# Patient Record
Sex: Female | Born: 1945 | Race: White | Hispanic: No | Smoking: Former smoker
Health system: Southern US, Community
[De-identification: ages and names within clinical notes are randomized; demographics above are authoritative.]

## PROBLEM LIST (undated history)

## (undated) DIAGNOSIS — E7800 Pure hypercholesterolemia, unspecified: Secondary | ICD-10-CM

## (undated) DIAGNOSIS — M1990 Unspecified osteoarthritis, unspecified site: Secondary | ICD-10-CM

## (undated) DIAGNOSIS — F419 Anxiety disorder, unspecified: Secondary | ICD-10-CM

## (undated) DIAGNOSIS — E039 Hypothyroidism, unspecified: Secondary | ICD-10-CM

## (undated) DIAGNOSIS — E119 Type 2 diabetes mellitus without complications: Secondary | ICD-10-CM

## (undated) DIAGNOSIS — F209 Schizophrenia, unspecified: Secondary | ICD-10-CM

## (undated) DIAGNOSIS — I10 Essential (primary) hypertension: Secondary | ICD-10-CM

## (undated) DIAGNOSIS — M069 Rheumatoid arthritis, unspecified: Secondary | ICD-10-CM

## (undated) DIAGNOSIS — J449 Chronic obstructive pulmonary disease, unspecified: Secondary | ICD-10-CM

## (undated) HISTORY — DX: Schizophrenia, unspecified: F20.9

## (undated) HISTORY — PX: ABDOMINAL HYSTERECTOMY: SHX81

## (undated) HISTORY — DX: Type 2 diabetes mellitus without complications: E11.9

## (undated) HISTORY — DX: Unspecified osteoarthritis, unspecified site: M19.90

## (undated) HISTORY — DX: Pure hypercholesterolemia, unspecified: E78.00

## (undated) HISTORY — DX: Essential (primary) hypertension: I10

## (undated) HISTORY — DX: Rheumatoid arthritis, unspecified: M06.9

## (undated) HISTORY — DX: Chronic obstructive pulmonary disease, unspecified: J44.9

## (undated) HISTORY — DX: Anxiety disorder, unspecified: F41.9

## (undated) HISTORY — DX: Hypothyroidism, unspecified: E03.9

---

## 2009-06-28 ENCOUNTER — Emergency Department (HOSPITAL_COMMUNITY): Admission: EM | Admit: 2009-06-28 | Discharge: 2009-06-28 | Payer: Self-pay | Admitting: Family Medicine

## 2010-06-07 ENCOUNTER — Inpatient Hospital Stay (HOSPITAL_COMMUNITY)
Admission: RE | Admit: 2010-06-07 | Discharge: 2010-06-07 | Disposition: A | Discharge status: Home / Self Care | Payer: Medicare PPO | Source: Ambulatory Visit | Attending: Family Medicine | Admitting: Family Medicine

## 2010-06-07 ENCOUNTER — Inpatient Hospital Stay (INDEPENDENT_AMBULATORY_CARE_PROVIDER_SITE_OTHER)
Admission: RE | Admit: 2010-06-07 | Discharge: 2010-06-07 | Disposition: A | Discharge status: Home / Self Care | Payer: Medicare PPO | Source: Ambulatory Visit | Attending: Family Medicine | Admitting: Family Medicine

## 2010-06-07 DIAGNOSIS — J449 Chronic obstructive pulmonary disease, unspecified: Secondary | ICD-10-CM

## 2010-06-20 ENCOUNTER — Inpatient Hospital Stay (INDEPENDENT_AMBULATORY_CARE_PROVIDER_SITE_OTHER)
Admission: RE | Admit: 2010-06-20 | Discharge: 2010-06-20 | Disposition: A | Discharge status: Home / Self Care | Payer: Medicare PPO | Source: Ambulatory Visit | Attending: Family Medicine | Admitting: Family Medicine

## 2010-06-20 ENCOUNTER — Ambulatory Visit (INDEPENDENT_AMBULATORY_CARE_PROVIDER_SITE_OTHER): Payer: Medicare PPO

## 2010-06-20 DIAGNOSIS — J40 Bronchitis, not specified as acute or chronic: Secondary | ICD-10-CM

## 2010-06-20 DIAGNOSIS — J069 Acute upper respiratory infection, unspecified: Secondary | ICD-10-CM

## 2010-06-20 DIAGNOSIS — J989 Respiratory disorder, unspecified: Secondary | ICD-10-CM

## 2014-06-13 DIAGNOSIS — H6123 Impacted cerumen, bilateral: Secondary | ICD-10-CM | POA: Diagnosis not present

## 2014-06-13 DIAGNOSIS — J189 Pneumonia, unspecified organism: Secondary | ICD-10-CM | POA: Diagnosis not present

## 2014-06-19 DIAGNOSIS — I129 Hypertensive chronic kidney disease with stage 1 through stage 4 chronic kidney disease, or unspecified chronic kidney disease: Secondary | ICD-10-CM | POA: Diagnosis not present

## 2014-06-19 DIAGNOSIS — J189 Pneumonia, unspecified organism: Secondary | ICD-10-CM | POA: Diagnosis not present

## 2014-06-19 DIAGNOSIS — E785 Hyperlipidemia, unspecified: Secondary | ICD-10-CM | POA: Diagnosis not present

## 2014-06-19 DIAGNOSIS — E1140 Type 2 diabetes mellitus with diabetic neuropathy, unspecified: Secondary | ICD-10-CM | POA: Diagnosis not present

## 2014-06-19 DIAGNOSIS — Z9119 Patient's noncompliance with other medical treatment and regimen: Secondary | ICD-10-CM | POA: Diagnosis not present

## 2014-06-19 DIAGNOSIS — J449 Chronic obstructive pulmonary disease, unspecified: Secondary | ICD-10-CM | POA: Diagnosis not present

## 2014-07-04 DIAGNOSIS — E785 Hyperlipidemia, unspecified: Secondary | ICD-10-CM | POA: Diagnosis not present

## 2014-07-04 DIAGNOSIS — I10 Essential (primary) hypertension: Secondary | ICD-10-CM | POA: Diagnosis not present

## 2014-07-04 DIAGNOSIS — E1140 Type 2 diabetes mellitus with diabetic neuropathy, unspecified: Secondary | ICD-10-CM | POA: Diagnosis not present

## 2014-07-04 DIAGNOSIS — E1169 Type 2 diabetes mellitus with other specified complication: Secondary | ICD-10-CM | POA: Diagnosis not present

## 2014-07-23 DIAGNOSIS — Z1231 Encounter for screening mammogram for malignant neoplasm of breast: Secondary | ICD-10-CM | POA: Diagnosis not present

## 2014-07-25 DIAGNOSIS — R350 Frequency of micturition: Secondary | ICD-10-CM | POA: Diagnosis not present

## 2014-07-25 DIAGNOSIS — Z1210 Encounter for screening for malignant neoplasm of intestinal tract, unspecified: Secondary | ICD-10-CM | POA: Diagnosis not present

## 2014-07-25 DIAGNOSIS — Z1231 Encounter for screening mammogram for malignant neoplasm of breast: Secondary | ICD-10-CM | POA: Diagnosis not present

## 2014-07-25 DIAGNOSIS — B373 Candidiasis of vulva and vagina: Secondary | ICD-10-CM | POA: Diagnosis not present

## 2014-07-25 DIAGNOSIS — I499 Cardiac arrhythmia, unspecified: Secondary | ICD-10-CM | POA: Diagnosis not present

## 2014-09-02 DIAGNOSIS — Z1231 Encounter for screening mammogram for malignant neoplasm of breast: Secondary | ICD-10-CM | POA: Diagnosis not present

## 2014-09-06 DIAGNOSIS — D0511 Intraductal carcinoma in situ of right breast: Secondary | ICD-10-CM | POA: Diagnosis not present

## 2014-10-10 DIAGNOSIS — F209 Schizophrenia, unspecified: Secondary | ICD-10-CM | POA: Diagnosis not present

## 2014-10-15 DIAGNOSIS — M25511 Pain in right shoulder: Secondary | ICD-10-CM | POA: Diagnosis not present

## 2014-11-06 DIAGNOSIS — I10 Essential (primary) hypertension: Secondary | ICD-10-CM | POA: Diagnosis not present

## 2014-11-12 DIAGNOSIS — J441 Chronic obstructive pulmonary disease with (acute) exacerbation: Secondary | ICD-10-CM | POA: Diagnosis not present

## 2014-11-12 DIAGNOSIS — J9622 Acute and chronic respiratory failure with hypercapnia: Secondary | ICD-10-CM | POA: Diagnosis not present

## 2014-11-21 DIAGNOSIS — E785 Hyperlipidemia, unspecified: Secondary | ICD-10-CM | POA: Diagnosis not present

## 2014-11-21 DIAGNOSIS — E1140 Type 2 diabetes mellitus with diabetic neuropathy, unspecified: Secondary | ICD-10-CM | POA: Diagnosis not present

## 2014-11-21 DIAGNOSIS — E1159 Type 2 diabetes mellitus with other circulatory complications: Secondary | ICD-10-CM | POA: Diagnosis not present

## 2014-11-21 DIAGNOSIS — E039 Hypothyroidism, unspecified: Secondary | ICD-10-CM | POA: Diagnosis not present

## 2014-11-21 DIAGNOSIS — I10 Essential (primary) hypertension: Secondary | ICD-10-CM | POA: Diagnosis not present

## 2014-12-28 DIAGNOSIS — G4733 Obstructive sleep apnea (adult) (pediatric): Secondary | ICD-10-CM | POA: Diagnosis not present

## 2015-01-30 DIAGNOSIS — M1990 Unspecified osteoarthritis, unspecified site: Secondary | ICD-10-CM | POA: Diagnosis not present

## 2015-01-30 DIAGNOSIS — Z6830 Body mass index (BMI) 30.0-30.9, adult: Secondary | ICD-10-CM | POA: Diagnosis not present

## 2015-02-07 DIAGNOSIS — M1711 Unilateral primary osteoarthritis, right knee: Secondary | ICD-10-CM | POA: Diagnosis not present

## 2015-02-07 DIAGNOSIS — M1712 Unilateral primary osteoarthritis, left knee: Secondary | ICD-10-CM | POA: Diagnosis not present

## 2015-02-11 DIAGNOSIS — E1159 Type 2 diabetes mellitus with other circulatory complications: Secondary | ICD-10-CM | POA: Diagnosis not present

## 2015-02-11 DIAGNOSIS — E785 Hyperlipidemia, unspecified: Secondary | ICD-10-CM | POA: Diagnosis not present

## 2015-02-11 DIAGNOSIS — E1140 Type 2 diabetes mellitus with diabetic neuropathy, unspecified: Secondary | ICD-10-CM | POA: Diagnosis not present

## 2015-02-17 DIAGNOSIS — M1711 Unilateral primary osteoarthritis, right knee: Secondary | ICD-10-CM | POA: Diagnosis not present

## 2015-02-19 DIAGNOSIS — E1159 Type 2 diabetes mellitus with other circulatory complications: Secondary | ICD-10-CM | POA: Diagnosis not present

## 2015-02-19 DIAGNOSIS — Z0000 Encounter for general adult medical examination without abnormal findings: Secondary | ICD-10-CM | POA: Diagnosis not present

## 2015-02-19 DIAGNOSIS — Z139 Encounter for screening, unspecified: Secondary | ICD-10-CM | POA: Diagnosis not present

## 2015-02-19 DIAGNOSIS — Z23 Encounter for immunization: Secondary | ICD-10-CM | POA: Diagnosis not present

## 2015-02-19 DIAGNOSIS — E1140 Type 2 diabetes mellitus with diabetic neuropathy, unspecified: Secondary | ICD-10-CM | POA: Diagnosis not present

## 2015-02-19 DIAGNOSIS — E785 Hyperlipidemia, unspecified: Secondary | ICD-10-CM | POA: Diagnosis not present

## 2015-02-19 DIAGNOSIS — I10 Essential (primary) hypertension: Secondary | ICD-10-CM | POA: Diagnosis not present

## 2015-02-19 DIAGNOSIS — Z1389 Encounter for screening for other disorder: Secondary | ICD-10-CM | POA: Diagnosis not present

## 2015-02-23 DIAGNOSIS — M25571 Pain in right ankle and joints of right foot: Secondary | ICD-10-CM | POA: Diagnosis not present

## 2015-02-23 DIAGNOSIS — S82431A Displaced oblique fracture of shaft of right fibula, initial encounter for closed fracture: Secondary | ICD-10-CM | POA: Diagnosis not present

## 2015-02-26 DIAGNOSIS — S82401S Unspecified fracture of shaft of right fibula, sequela: Secondary | ICD-10-CM | POA: Diagnosis not present

## 2015-02-26 DIAGNOSIS — M1990 Unspecified osteoarthritis, unspecified site: Secondary | ICD-10-CM | POA: Diagnosis not present

## 2015-02-26 DIAGNOSIS — Z6829 Body mass index (BMI) 29.0-29.9, adult: Secondary | ICD-10-CM | POA: Diagnosis not present

## 2015-03-03 DIAGNOSIS — S82831A Other fracture of upper and lower end of right fibula, initial encounter for closed fracture: Secondary | ICD-10-CM | POA: Diagnosis not present

## 2015-03-25 DIAGNOSIS — S82831A Other fracture of upper and lower end of right fibula, initial encounter for closed fracture: Secondary | ICD-10-CM | POA: Diagnosis not present

## 2015-04-17 DIAGNOSIS — S82831A Other fracture of upper and lower end of right fibula, initial encounter for closed fracture: Secondary | ICD-10-CM | POA: Diagnosis not present

## 2015-04-25 DIAGNOSIS — I10 Essential (primary) hypertension: Secondary | ICD-10-CM | POA: Diagnosis not present

## 2015-04-25 DIAGNOSIS — M79604 Pain in right leg: Secondary | ICD-10-CM | POA: Diagnosis not present

## 2015-04-25 DIAGNOSIS — B379 Candidiasis, unspecified: Secondary | ICD-10-CM | POA: Diagnosis not present

## 2015-04-25 DIAGNOSIS — E7800 Pure hypercholesterolemia, unspecified: Secondary | ICD-10-CM | POA: Diagnosis not present

## 2015-04-25 DIAGNOSIS — J449 Chronic obstructive pulmonary disease, unspecified: Secondary | ICD-10-CM | POA: Diagnosis not present

## 2015-04-25 DIAGNOSIS — S82831A Other fracture of upper and lower end of right fibula, initial encounter for closed fracture: Secondary | ICD-10-CM | POA: Diagnosis not present

## 2015-04-25 DIAGNOSIS — E119 Type 2 diabetes mellitus without complications: Secondary | ICD-10-CM | POA: Diagnosis not present

## 2015-04-25 DIAGNOSIS — B372 Candidiasis of skin and nail: Secondary | ICD-10-CM | POA: Diagnosis not present

## 2015-04-25 DIAGNOSIS — F319 Bipolar disorder, unspecified: Secondary | ICD-10-CM | POA: Diagnosis not present

## 2015-04-25 DIAGNOSIS — E039 Hypothyroidism, unspecified: Secondary | ICD-10-CM | POA: Diagnosis not present

## 2015-04-25 DIAGNOSIS — F209 Schizophrenia, unspecified: Secondary | ICD-10-CM | POA: Diagnosis not present

## 2015-04-25 DIAGNOSIS — M069 Rheumatoid arthritis, unspecified: Secondary | ICD-10-CM | POA: Diagnosis not present

## 2015-04-26 DIAGNOSIS — M79604 Pain in right leg: Secondary | ICD-10-CM | POA: Diagnosis not present

## 2015-04-26 DIAGNOSIS — S82831A Other fracture of upper and lower end of right fibula, initial encounter for closed fracture: Secondary | ICD-10-CM | POA: Diagnosis not present

## 2015-04-26 DIAGNOSIS — F319 Bipolar disorder, unspecified: Secondary | ICD-10-CM | POA: Diagnosis not present

## 2015-04-26 DIAGNOSIS — I10 Essential (primary) hypertension: Secondary | ICD-10-CM | POA: Diagnosis not present

## 2015-04-26 DIAGNOSIS — E7800 Pure hypercholesterolemia, unspecified: Secondary | ICD-10-CM | POA: Diagnosis not present

## 2015-04-26 DIAGNOSIS — F209 Schizophrenia, unspecified: Secondary | ICD-10-CM | POA: Diagnosis not present

## 2015-04-26 DIAGNOSIS — J449 Chronic obstructive pulmonary disease, unspecified: Secondary | ICD-10-CM | POA: Diagnosis not present

## 2015-04-26 DIAGNOSIS — M069 Rheumatoid arthritis, unspecified: Secondary | ICD-10-CM | POA: Diagnosis not present

## 2015-04-26 DIAGNOSIS — E039 Hypothyroidism, unspecified: Secondary | ICD-10-CM | POA: Diagnosis not present

## 2015-04-26 DIAGNOSIS — B379 Candidiasis, unspecified: Secondary | ICD-10-CM | POA: Diagnosis not present

## 2015-04-26 DIAGNOSIS — E119 Type 2 diabetes mellitus without complications: Secondary | ICD-10-CM | POA: Diagnosis not present

## 2015-05-26 DIAGNOSIS — S82831D Other fracture of upper and lower end of right fibula, subsequent encounter for closed fracture with routine healing: Secondary | ICD-10-CM | POA: Diagnosis not present

## 2015-06-13 DIAGNOSIS — F209 Schizophrenia, unspecified: Secondary | ICD-10-CM | POA: Diagnosis not present

## 2015-06-17 DIAGNOSIS — E785 Hyperlipidemia, unspecified: Secondary | ICD-10-CM | POA: Diagnosis not present

## 2015-06-17 DIAGNOSIS — E039 Hypothyroidism, unspecified: Secondary | ICD-10-CM | POA: Diagnosis not present

## 2015-06-17 DIAGNOSIS — E1140 Type 2 diabetes mellitus with diabetic neuropathy, unspecified: Secondary | ICD-10-CM | POA: Diagnosis not present

## 2015-06-17 DIAGNOSIS — E1159 Type 2 diabetes mellitus with other circulatory complications: Secondary | ICD-10-CM | POA: Diagnosis not present

## 2015-06-24 DIAGNOSIS — Z6829 Body mass index (BMI) 29.0-29.9, adult: Secondary | ICD-10-CM | POA: Diagnosis not present

## 2015-06-24 DIAGNOSIS — B372 Candidiasis of skin and nail: Secondary | ICD-10-CM | POA: Diagnosis not present

## 2015-06-24 DIAGNOSIS — M2550 Pain in unspecified joint: Secondary | ICD-10-CM | POA: Diagnosis not present

## 2015-07-07 DIAGNOSIS — Z6830 Body mass index (BMI) 30.0-30.9, adult: Secondary | ICD-10-CM | POA: Diagnosis not present

## 2015-07-07 DIAGNOSIS — M2550 Pain in unspecified joint: Secondary | ICD-10-CM | POA: Diagnosis not present

## 2015-07-07 DIAGNOSIS — R768 Other specified abnormal immunological findings in serum: Secondary | ICD-10-CM | POA: Diagnosis not present

## 2015-07-17 DIAGNOSIS — S82831D Other fracture of upper and lower end of right fibula, subsequent encounter for closed fracture with routine healing: Secondary | ICD-10-CM | POA: Diagnosis not present

## 2015-08-20 DIAGNOSIS — E039 Hypothyroidism, unspecified: Secondary | ICD-10-CM | POA: Diagnosis not present

## 2015-09-05 DIAGNOSIS — M2550 Pain in unspecified joint: Secondary | ICD-10-CM | POA: Diagnosis not present

## 2015-09-05 DIAGNOSIS — Z79899 Other long term (current) drug therapy: Secondary | ICD-10-CM | POA: Diagnosis not present

## 2015-09-05 DIAGNOSIS — M79671 Pain in right foot: Secondary | ICD-10-CM | POA: Diagnosis not present

## 2015-09-05 DIAGNOSIS — M79641 Pain in right hand: Secondary | ICD-10-CM | POA: Diagnosis not present

## 2015-09-05 DIAGNOSIS — M79642 Pain in left hand: Secondary | ICD-10-CM | POA: Diagnosis not present

## 2015-09-05 DIAGNOSIS — R5381 Other malaise: Secondary | ICD-10-CM | POA: Diagnosis not present

## 2015-09-05 DIAGNOSIS — M25562 Pain in left knee: Secondary | ICD-10-CM | POA: Diagnosis not present

## 2015-09-05 DIAGNOSIS — F209 Schizophrenia, unspecified: Secondary | ICD-10-CM | POA: Diagnosis not present

## 2015-09-05 DIAGNOSIS — M79672 Pain in left foot: Secondary | ICD-10-CM | POA: Diagnosis not present

## 2015-09-05 DIAGNOSIS — M25561 Pain in right knee: Secondary | ICD-10-CM | POA: Diagnosis not present

## 2015-09-13 DIAGNOSIS — H1033 Unspecified acute conjunctivitis, bilateral: Secondary | ICD-10-CM | POA: Diagnosis not present

## 2015-09-16 DIAGNOSIS — M79604 Pain in right leg: Secondary | ICD-10-CM | POA: Diagnosis not present

## 2015-09-16 DIAGNOSIS — S82831D Other fracture of upper and lower end of right fibula, subsequent encounter for closed fracture with routine healing: Secondary | ICD-10-CM | POA: Diagnosis not present

## 2015-09-16 DIAGNOSIS — M79661 Pain in right lower leg: Secondary | ICD-10-CM | POA: Diagnosis not present

## 2015-09-16 DIAGNOSIS — S93401A Sprain of unspecified ligament of right ankle, initial encounter: Secondary | ICD-10-CM | POA: Diagnosis not present

## 2015-09-16 DIAGNOSIS — M79669 Pain in unspecified lower leg: Secondary | ICD-10-CM | POA: Diagnosis not present

## 2015-10-02 DIAGNOSIS — Z6828 Body mass index (BMI) 28.0-28.9, adult: Secondary | ICD-10-CM | POA: Diagnosis not present

## 2015-10-02 DIAGNOSIS — R262 Difficulty in walking, not elsewhere classified: Secondary | ICD-10-CM | POA: Diagnosis not present

## 2015-10-02 DIAGNOSIS — M79671 Pain in right foot: Secondary | ICD-10-CM | POA: Diagnosis not present

## 2015-10-07 DIAGNOSIS — M79671 Pain in right foot: Secondary | ICD-10-CM | POA: Diagnosis not present

## 2015-10-07 DIAGNOSIS — M25571 Pain in right ankle and joints of right foot: Secondary | ICD-10-CM | POA: Diagnosis not present

## 2015-10-10 DIAGNOSIS — M25561 Pain in right knee: Secondary | ICD-10-CM | POA: Diagnosis not present

## 2015-10-10 DIAGNOSIS — M0579 Rheumatoid arthritis with rheumatoid factor of multiple sites without organ or systems involvement: Secondary | ICD-10-CM | POA: Diagnosis not present

## 2015-10-10 DIAGNOSIS — M79641 Pain in right hand: Secondary | ICD-10-CM | POA: Diagnosis not present

## 2015-10-10 DIAGNOSIS — M79671 Pain in right foot: Secondary | ICD-10-CM | POA: Diagnosis not present

## 2015-10-17 DIAGNOSIS — M25571 Pain in right ankle and joints of right foot: Secondary | ICD-10-CM | POA: Diagnosis not present

## 2015-10-17 DIAGNOSIS — M79671 Pain in right foot: Secondary | ICD-10-CM | POA: Diagnosis not present

## 2015-10-28 DIAGNOSIS — Z79899 Other long term (current) drug therapy: Secondary | ICD-10-CM | POA: Diagnosis not present

## 2015-11-05 DIAGNOSIS — E1159 Type 2 diabetes mellitus with other circulatory complications: Secondary | ICD-10-CM | POA: Diagnosis not present

## 2015-11-05 DIAGNOSIS — E785 Hyperlipidemia, unspecified: Secondary | ICD-10-CM | POA: Diagnosis not present

## 2015-11-05 DIAGNOSIS — E039 Hypothyroidism, unspecified: Secondary | ICD-10-CM | POA: Diagnosis not present

## 2015-11-05 DIAGNOSIS — E1140 Type 2 diabetes mellitus with diabetic neuropathy, unspecified: Secondary | ICD-10-CM | POA: Diagnosis not present

## 2015-11-14 DIAGNOSIS — F209 Schizophrenia, unspecified: Secondary | ICD-10-CM | POA: Diagnosis not present

## 2015-11-17 DIAGNOSIS — I10 Essential (primary) hypertension: Secondary | ICD-10-CM | POA: Diagnosis not present

## 2015-11-17 DIAGNOSIS — E039 Hypothyroidism, unspecified: Secondary | ICD-10-CM | POA: Diagnosis not present

## 2015-11-17 DIAGNOSIS — E1159 Type 2 diabetes mellitus with other circulatory complications: Secondary | ICD-10-CM | POA: Diagnosis not present

## 2015-11-17 DIAGNOSIS — E1140 Type 2 diabetes mellitus with diabetic neuropathy, unspecified: Secondary | ICD-10-CM | POA: Diagnosis not present

## 2015-12-10 DIAGNOSIS — M0579 Rheumatoid arthritis with rheumatoid factor of multiple sites without organ or systems involvement: Secondary | ICD-10-CM | POA: Diagnosis not present

## 2015-12-10 DIAGNOSIS — M1711 Unilateral primary osteoarthritis, right knee: Secondary | ICD-10-CM | POA: Diagnosis not present

## 2015-12-10 DIAGNOSIS — Z09 Encounter for follow-up examination after completed treatment for conditions other than malignant neoplasm: Secondary | ICD-10-CM | POA: Diagnosis not present

## 2015-12-10 DIAGNOSIS — M79671 Pain in right foot: Secondary | ICD-10-CM | POA: Diagnosis not present

## 2015-12-10 DIAGNOSIS — Z79899 Other long term (current) drug therapy: Secondary | ICD-10-CM | POA: Diagnosis not present

## 2016-01-06 DIAGNOSIS — R300 Dysuria: Secondary | ICD-10-CM | POA: Diagnosis not present

## 2016-01-06 DIAGNOSIS — R319 Hematuria, unspecified: Secondary | ICD-10-CM | POA: Diagnosis not present

## 2016-01-06 DIAGNOSIS — G8929 Other chronic pain: Secondary | ICD-10-CM | POA: Diagnosis not present

## 2016-01-06 DIAGNOSIS — B379 Candidiasis, unspecified: Secondary | ICD-10-CM | POA: Diagnosis not present

## 2016-01-06 DIAGNOSIS — Z1389 Encounter for screening for other disorder: Secondary | ICD-10-CM | POA: Diagnosis not present

## 2016-01-13 DIAGNOSIS — E1140 Type 2 diabetes mellitus with diabetic neuropathy, unspecified: Secondary | ICD-10-CM | POA: Diagnosis not present

## 2016-01-13 DIAGNOSIS — B373 Candidiasis of vulva and vagina: Secondary | ICD-10-CM | POA: Diagnosis not present

## 2016-01-13 DIAGNOSIS — Z6829 Body mass index (BMI) 29.0-29.9, adult: Secondary | ICD-10-CM | POA: Diagnosis not present

## 2016-01-27 DIAGNOSIS — L22 Diaper dermatitis: Secondary | ICD-10-CM | POA: Diagnosis not present

## 2016-01-27 DIAGNOSIS — N766 Ulceration of vulva: Secondary | ICD-10-CM | POA: Diagnosis not present

## 2016-01-27 DIAGNOSIS — N898 Other specified noninflammatory disorders of vagina: Secondary | ICD-10-CM | POA: Diagnosis not present

## 2016-01-27 DIAGNOSIS — R8271 Bacteriuria: Secondary | ICD-10-CM | POA: Diagnosis not present

## 2016-02-05 DIAGNOSIS — B372 Candidiasis of skin and nail: Secondary | ICD-10-CM | POA: Diagnosis not present

## 2016-02-05 DIAGNOSIS — L0390 Cellulitis, unspecified: Secondary | ICD-10-CM | POA: Diagnosis not present

## 2016-02-05 DIAGNOSIS — Z23 Encounter for immunization: Secondary | ICD-10-CM | POA: Diagnosis not present

## 2016-02-05 DIAGNOSIS — M2550 Pain in unspecified joint: Secondary | ICD-10-CM | POA: Diagnosis not present

## 2016-02-07 ENCOUNTER — Other Ambulatory Visit: Payer: Self-pay | Admitting: Radiology

## 2016-02-07 DIAGNOSIS — Z79899 Other long term (current) drug therapy: Secondary | ICD-10-CM

## 2016-02-12 DIAGNOSIS — E1140 Type 2 diabetes mellitus with diabetic neuropathy, unspecified: Secondary | ICD-10-CM | POA: Diagnosis not present

## 2016-02-12 DIAGNOSIS — E1159 Type 2 diabetes mellitus with other circulatory complications: Secondary | ICD-10-CM | POA: Diagnosis not present

## 2016-03-07 NOTE — Progress Notes (Signed)
*IMAGE* Office Visit Note  Patient: Karla Clark             Date of Birth: Jan 11, 1946           MRN: [id number]             PCP: Charlott Rakes, MD Referring: No ref. provider found Visit Date: 03/11/2016 Occupation:@GUAROCC @    Subjective:  No chief complaint on file. CHIEF COMPLAINT:  Followup on rheumatoid arthritis and high-risk prescription.  History of Present Illness: Karla Clark is a 70 y.o. female   Last visit of 12/20/2015:===>HISTORY OF PRESENT ILLNESS:  The patient was last seen in our office on October 10, 2015.  The patient is here with her daughter Karla Clark who is giving most of the history.  She was initially seen by Dr. Corliss Skains and is presenting today to see how well she is responding to the methotrexate.  According to the daughter, she has been taking 4 pills of methotrexate every week and folic acid 1 pill every daily.  She continues to have some sort of pain in her joints but overall has done better.  She sees [name] for regular medical care and sees [place] for the rheumatological evaluations.  According to the daughter, the patient is doing well with the methotrexate and folic acid.  She just started this medication on about October 10, 2015, so she has only been on it for 2 months, and she is at a low dose of 4 pills per week.  According to the daughter, she has been having changes in her hands for multiple years and only recently was told it was rheumatoid arthritis.  According to the daughter, when she pressed that if anything can be done for her rheumatoid arthritis, the patient was sent to our office.  Unfortunately, the patient has ulnar deviation of both hands with some contractures.  She has more partial flexion of her left hand which has some dermal irritation and may be a yeast infection in the palm, which needs to be addressed either through the primary care physician or through a dermatologist.  The patient was given that advice through the daughter, and they  will follow up.  Because of the way her hands are, we also offered her a rollator, but the patient states that when she borrowed somebody else's rollator, it rolled away from her, and the patient ended up falling.  She does not have good grip strength, and she may not be able to grasp the rollator properly causing more risk for falls, but the daughter was able to share on a new device that she saw, which is a rollator, but the brakes become activated with the weight of the patient instead of any kind of handle brakes, which would be more helpful for the patient, so when she gets specific information about that rollator, she will inform me, and then I be happy to write a prescription accordingly.  Whether or not it is going to be covered by her insurance is a second part of this prescription, and only after the prescription is written will she be able to explore if that is an option and if it is affordable, and I will leave that up to the patient and the daughter to sort out.  Last visit of 12/20/2015:===> IMPRESSION/PLAN:   1.  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  She has left greater than right ulnar deviation.  Today, there is synovial thickening like there was on the last  time, but no tenderness to palpation. 2.  High-risk prescription on methotrexate 4 per week and folic acid 1 per day. 3.  Ulnar deviation, left greater than right.  Note that on the left palmar aspect, she has some dermatitis occurring which she needs to see her family doctor for. 4.  Bilateral feet with bunions. 5.  Bilateral knee joint pain with the left knee having decreased range of motion.  The patient usually gets steroid injections from her doctors in the [place] area.  She wants to know if we will be able to do injections, and we discussed the importance of minimizing cortisone injections and try to see if hyaluronic acid might be useful for osteoarthritis of the knee joints.  We will have to  investigate further and establish osteoarthritis and then apply for viscosupplementation in the future if the patient decides to do this. 6.  Refill folic acid 1 pill every day, 90-day-supply with 4 refills. 7.  Refill methotrexate 4 pills per week, 90-day-supply with no refills. 8.  We will offer a rollator after the patient's daughter specifies a rollator that "brakes with the weight of the patient". 9.  CBC with differential and CMP with GFR every 2 months starting today in the office. 10.  Return to clinic in 3 months for followup. 11.  Indications, contraindications, and side effects of medications were discussed with the patient at length. 12.  Time spent with the patient was 30 minutes with 50% of the time spent on discussing the efficacy of the treatment plan.   On today's visit: Patient is doing well overall according to leases history. Karla Clark is the daughter. She was here last visit as well. Her right hand is the better hand of the 2 and the right hand is having slightly more pain than the last time. Overall though the patient is responding well to methotrexate.  Also having some left leg pain. According to release of the right leg looks a little bit worse in the knee than the left one does but the left one is the one this been bothering the patient.  Patient has been given naproxen 220 mg, 4 pills in the morning and 4 pills in the evening, to address her pain. Despite this high dose patient continues to have ongoing pain but slightly improved as a result of the naproxen. Patient rates her pain discomfort as a 5 when she takes her medication; she rates it 10 went without the pain medication.    Activities of Daily Living:  Patient reports morning stiffness for 15 minutes.   Patient Denies nocturnal pain.  Difficulty dressing/grooming: Reports Difficulty climbing stairs: Reports Difficulty getting out of chair: Reports Difficulty using hands for taps, buttons, cutlery, and/or writing:  Reports   Review of Systems  Constitutional: Negative for fatigue.  HENT: Negative for mouth sores and mouth dryness.   Eyes: Negative for dryness.  Respiratory: Negative for shortness of breath.   Gastrointestinal: Negative for constipation and diarrhea.  Musculoskeletal: Negative for myalgias and myalgias.  Skin: Negative for sensitivity to sunlight.  Psychiatric/Behavioral: Negative for decreased concentration and sleep disturbance.    Last visit of 12/20/2015:===>REVIEW OF SYSTEMS:  Systemic review is significant for pain and tenderness.  No disturbed sleep pattern or fatigue.  No dry eyes or dry mouth.  No Raynaud's, mucosal ulcers, lymphadenopathy, other rashes, or photosensitivity.  No diarrhea.  No constipation.  She has some morning stiffness and occasional trouble with stairs, chair, car, and toilet as well as tops,  buttons, cutlery, and writing.  The rest of the systems are negative.  Please refer to the Rheumatology Visit Form for details.   PMFS History:  There are no active problems to display for this patient.   Past Medical History:  Diagnosis Date  . Anxiety   . COPD (chronic obstructive pulmonary disease) (HCC)   . Diabetes mellitus without complication (HCC)   . Elevated cholesterol   . Hypertension   . Hypothyroidism   . Osteoarthritis   . RA (rheumatoid arthritis) (HCC)   . Schizophrenia (HCC)    delusional    No family history on file. Past Surgical History:  Procedure Laterality Date  . ABDOMINAL HYSTERECTOMY     Social History   Social History Narrative  . No narrative on file   Last visit of 12/20/2015:===>SOCIAL HISTORY:  The patient is a non-smoker but used to smoke in the past, does not drink soda, and does not exercise.   Last visit of 12/20/2015:===>CURRENT MEDICATIONS:  Hydrocodone has been stopped by the family doctor because she thinks that it should be written by the rheumatologist.  I explained to the patient that we have her rheumatoid  arthritis under much better control, and so therefore we will not be able to written hydrocodone that is for other body pains, and her family doctor, if she choose to write it, can write that for her.  The patient also is on Invokana, levothyroxine, fluphenazine, venlafaxine ER, simvastatin, hydroxyzine, pioglitazone, valsartan, metformin, methotrexate 4 per week, folic acid 1 per day, and ibuprofen.    Last visit of 12/20/2015:===>MEDICATION ALLERGIES:  ANGIOTENSIN 1 CONVERTING ENZYME INHIBITORS.  Objective: Vital Signs: BP (!) 129/94 (BP Location: Left Arm, Patient Position: Sitting, Cuff Size: Large)   Pulse (!) 105   Resp 14   Ht 5\' 1"  (1.549 m)   Wt 160 lb (72.6 kg)   BMI 30.23 kg/m    Physical Exam  Constitutional: She is oriented to person, place, and time. She appears well-developed and well-nourished.  Patient has tremors after she was on Abilify. She is off of Abilify now but the tremors have continued.  HENT:  Head: Normocephalic and atraumatic.  Eyes: EOM are normal. Pupils are equal, round, and reactive to light.  Cardiovascular: Normal rate, regular rhythm and normal heart sounds.  Exam reveals no gallop and no friction rub.   No murmur heard. Pulmonary/Chest: Effort normal and breath sounds normal. She has no wheezes. She has no rales.  Abdominal: Soft. Bowel sounds are normal. She exhibits no distension. There is no tenderness. There is no guarding. No hernia.  Musculoskeletal: Normal range of motion. She exhibits no edema, tenderness or deformity.  Lymphadenopathy:    She has no cervical adenopathy.  Neurological: She is alert and oriented to person, place, and time. Coordination normal.  Skin: Skin is warm and dry. Capillary refill takes less than 2 seconds. No rash noted.  Psychiatric: She has a normal mood and affect. Her behavior is normal.  Nursing note and vitals reviewed.    Musculoskeletal Exam:  The left shoulder joint has decreased range of motion, and the  left hip joint has decreased range of motion.  Otherwise good range of motion of other joints.  Grip strength is equal and strong bilaterally Fibromyalgia Tender Points:  All absent.  CDAI Exam: CDAI Homunculus Exam:   Tenderness:  RLE: tibiofemoral LLE: tibiofemoral  Joint Counts:  CDAI Tender Joint count: 2 CDAI Swollen Joint count: 0  Global Assessments:  Patient Global  Assessment: 5 Provider Global Assessment: 5  CDAI Calculated Score: 12    Investigation: Findings:     =============   INVESTIGATIONS:  Labs from October 28, 2015 show CMP with GFR normal, and CBC with differential is normal.  ===============  May 5, 201===>INVESTIGATIONS:  X-rays of bilateral hands, 2 views, show PIP and DIP narrowing.  All MCP joints appear subluxed and appeared narrow.  There was mild osteopenia, but there were no erosive changes.  This is not very typical for rheumatoid arthritis to have it for so long and not have erosive changes.   Bilateral feet x-rays, 2 views, showed bilateral PIP and DIP narrowing, bilateral 1st MTP narrowing and subluxation without any erosive changes.   Bilateral knee joint x-rays showed right lateral severe compartment narrowing and left severe medial and lateral compartment narrowing, bilateral moderate patellofemoral narrowing consistent with severe arthritis involving the knee joints, and chondromalacia patella. Has been      Imaging: No results found.  Speciality Comments: No specialty comments available.    Procedures:  No procedures performed Allergies: Ace inhibitors   Assessment / Plan: Visit Diagnoses: Rheumatoid arthritis with rheumatoid factor of multiple sites without organ or systems involvement (HCC)  High risk medications (not anticoagulants) long-term use - Plan: COMPLETE METABOLIC PANEL WITH GFR, CBC with Differential/Platelet  Osteoarthritis of both knees, unspecified osteoarthritis type  Osteoarthritis of both hands,  unspecified osteoarthritis type   I refilled patient's methotrexate for every week dispense 48 pills with no refills I refilled patient's folic acid 1 mg every day noted a supply with 4 refills CBC with differential CMP with GFR today Return to clinic in 3 months Patient has ongoing knee pain. I told her that she can come in for cortisone injection. Her pain is not severe enough to need it today. I advised Karla Clark, the daughter, that if the blood pressure is elevated I will not be able to give a cortisone injection. We offered Shields brace for the knees but Karla Clark states that her mother will not wear it. She states "I had a hard time letting her be in a cast when she had a broken leg." We also discussed unloader brace but Karla Clark said she probably will not wear that either. X-rays were reviewed from May 2017 visit which showed severe osteoarthritis of the knees. Patient has been seen by orthopedist in [place] area. She has had cortisone injections in the past which gave her partial relief. We discussed Visco supplementation in the future but chances of it being effective are minimal.   Orders: Orders Placed This Encounter  Procedures  . COMPLETE METABOLIC PANEL WITH GFR  . CBC with Differential/Platelet   Meds ordered this encounter  Medications  . clotrimazole-betamethasone (LOTRISONE) cream    Sig: APP CREAM TO VULAR AREA BID PRF REDNESS AND ITCHING    Refill:  0  . fluPHENAZine (PROLIXIN) 1 MG tablet    Sig: TK 1 T PO BID    Refill:  3  . DISCONTD: folic acid (FOLVITE) 1 MG tablet    Sig: TK 1 T PO QD    Refill:  3  . hydrOXYzine (ATARAX/VISTARIL) 25 MG tablet    Sig: TK 1/2 T PO BID PRN    Refill:  2  . levothyroxine (SYNTHROID, LEVOTHROID) 88 MCG tablet    Sig: TK 1 T PO D    Refill:  1  . DISCONTD: methotrexate (RHEUMATREX) 2.5 MG tablet    Sig: TK 4 TS PO 1 TIME A  WK    Refill:  0  . nystatin cream (MYCOSTATIN)    Sig: APP EXT AA BID FOR 7 TO 10 DAYS    Refill:  3  .  NYSTATIN powder    Sig: APPLY POWDER TWICE DAILY AS NEEDED    Refill:  3  . omeprazole (PRILOSEC) 20 MG capsule    Sig: TK 1 C PO D    Refill:  4  . pioglitazone (ACTOS) 30 MG tablet    Sig: TK 1 T PO D    Refill:  1  . simvastatin (ZOCOR) 40 MG tablet    Sig: TK 1 T PO HS    Refill:  1  . valsartan (DIOVAN) 160 MG tablet    Sig: TK 1 T PO QD    Refill:  3  . venlafaxine XR (EFFEXOR-XR) 75 MG 24 hr capsule    Sig: TK 1 C PO QAM    Refill:  3  . methotrexate (RHEUMATREX) 2.5 MG tablet    Sig: Take 4 tablets (10 mg total) by mouth once a week. Caution:Chemotherapy. Protect from light.    Dispense:  48 tablet    Refill:  0    Order Specific Question:   Supervising Provider    Answer:   Pollyann Savoy [[id number]]  . folic acid (FOLVITE) 1 MG tablet    Sig: Take 1 tablet (1 mg total) by mouth daily.    Dispense:  90 tablet    Refill:  4    Order Specific Question:   Supervising Provider    Answer:   Pollyann Savoy [phone]    Face-to-face time spent with patient was 30 minutes. 50% of time was spent in counseling and coordination of care.  Follow-Up Instructions: Return in about 3 months (around 06/11/2016) for RA, HRRX, OAKJ,  OAHands.   I examined and evaluated the patient with Tawni Pummel PA. The plan of care was discussed as noted above.  Pollyann Savoy, MD

## 2016-03-11 ENCOUNTER — Ambulatory Visit (INDEPENDENT_AMBULATORY_CARE_PROVIDER_SITE_OTHER): Payer: Medicare PPO | Admitting: Rheumatology

## 2016-03-11 ENCOUNTER — Encounter: Payer: Self-pay | Admitting: Rheumatology

## 2016-03-11 VITALS — BP 129/94 | HR 105 | Resp 14 | Ht 61.0 in | Wt 160.0 lb

## 2016-03-11 DIAGNOSIS — Z0000 Encounter for general adult medical examination without abnormal findings: Secondary | ICD-10-CM | POA: Diagnosis not present

## 2016-03-11 DIAGNOSIS — M170 Bilateral primary osteoarthritis of knee: Secondary | ICD-10-CM | POA: Diagnosis not present

## 2016-03-11 DIAGNOSIS — E1140 Type 2 diabetes mellitus with diabetic neuropathy, unspecified: Secondary | ICD-10-CM | POA: Diagnosis not present

## 2016-03-11 DIAGNOSIS — M19041 Primary osteoarthritis, right hand: Secondary | ICD-10-CM | POA: Diagnosis not present

## 2016-03-11 DIAGNOSIS — E785 Hyperlipidemia, unspecified: Secondary | ICD-10-CM | POA: Diagnosis not present

## 2016-03-11 DIAGNOSIS — Z79899 Other long term (current) drug therapy: Secondary | ICD-10-CM

## 2016-03-11 DIAGNOSIS — M19042 Primary osteoarthritis, left hand: Secondary | ICD-10-CM | POA: Diagnosis not present

## 2016-03-11 DIAGNOSIS — M0579 Rheumatoid arthritis with rheumatoid factor of multiple sites without organ or systems involvement: Secondary | ICD-10-CM | POA: Diagnosis not present

## 2016-03-11 DIAGNOSIS — E1159 Type 2 diabetes mellitus with other circulatory complications: Secondary | ICD-10-CM | POA: Diagnosis not present

## 2016-03-11 DIAGNOSIS — I10 Essential (primary) hypertension: Secondary | ICD-10-CM | POA: Diagnosis not present

## 2016-03-11 LAB — CBC WITH DIFFERENTIAL/PLATELET
BASOS PCT: 0 %
Basophils Absolute: 0 cells/uL (ref 0–200)
EOS ABS: 228 {cells}/uL (ref 15–500)
EOS PCT: 3 %
HCT: 38.8 % (ref 35.0–45.0)
Hemoglobin: 12.3 g/dL (ref 11.7–15.5)
LYMPHS PCT: 19 %
Lymphs Abs: 1444 cells/uL (ref 850–3900)
MCH: 31.6 pg (ref 27.0–33.0)
MCHC: 31.7 g/dL — ABNORMAL LOW (ref 32.0–36.0)
MCV: 99.7 fL (ref 80.0–100.0)
MONOS PCT: 9 %
MPV: 9 fL (ref 7.5–12.5)
Monocytes Absolute: 684 cells/uL (ref 200–950)
NEUTROS ABS: 5244 {cells}/uL (ref 1500–7800)
Neutrophils Relative %: 69 %
PLATELETS: 297 10*3/uL (ref 140–400)
RBC: 3.89 MIL/uL (ref 3.80–5.10)
RDW: 17.8 % — AB (ref 11.0–15.0)
WBC: 7.6 10*3/uL (ref 3.8–10.8)

## 2016-03-11 LAB — COMPLETE METABOLIC PANEL WITH GFR
ALBUMIN: 3.8 g/dL (ref 3.6–5.1)
ALK PHOS: 73 U/L (ref 33–130)
ALT: 9 U/L (ref 6–29)
AST: 17 U/L (ref 10–35)
BILIRUBIN TOTAL: 0.3 mg/dL (ref 0.2–1.2)
BUN: 10 mg/dL (ref 7–25)
CALCIUM: 8.8 mg/dL (ref 8.6–10.4)
CO2: 23 mmol/L (ref 20–31)
CREATININE: 0.71 mg/dL (ref 0.60–0.93)
Chloride: 102 mmol/L (ref 98–110)
GFR, EST NON AFRICAN AMERICAN: 86 mL/min (ref >=60)
Glucose, Bld: 94 mg/dL (ref 65–99)
Potassium: 4 mmol/L (ref 3.5–5.3)
Sodium: 136 mmol/L (ref 135–146)
TOTAL PROTEIN: 6.1 g/dL (ref 6.1–8.1)

## 2016-03-11 MED ORDER — FOLIC ACID 1 MG PO TABS: 1.0000 mg | ORAL_TABLET | Freq: Every day | ORAL | 4 refills | Status: DC

## 2016-03-11 MED ORDER — METHOTREXATE 2.5 MG PO TABS: 10.0000 mg | ORAL_TABLET | ORAL | 0 refills | Status: AC

## 2016-03-12 ENCOUNTER — Telehealth: Payer: Self-pay | Admitting: Radiology

## 2016-03-12 NOTE — Progress Notes (Signed)
03/11/2016==>CMP with GFR normal 03/11/2016==> CBC with differential is normal

## 2016-03-12 NOTE — Telephone Encounter (Signed)
I have called patient to advise labs are normal  

## 2016-03-12 NOTE — Telephone Encounter (Signed)
-----   Message from [place], [place] sent at 03/12/2016 12:26 PM EST ----- 03/11/2016==>CMP with GFR normal 03/11/2016==> CBC with differential is normal

## 2016-04-16 DIAGNOSIS — F209 Schizophrenia, unspecified: Secondary | ICD-10-CM | POA: Diagnosis not present

## 2016-05-13 DIAGNOSIS — J441 Chronic obstructive pulmonary disease with (acute) exacerbation: Secondary | ICD-10-CM | POA: Diagnosis not present

## 2016-05-13 DIAGNOSIS — L298 Other pruritus: Secondary | ICD-10-CM | POA: Diagnosis not present

## 2016-05-13 DIAGNOSIS — M1710 Unilateral primary osteoarthritis, unspecified knee: Secondary | ICD-10-CM | POA: Diagnosis not present

## 2016-05-13 DIAGNOSIS — F200 Paranoid schizophrenia: Secondary | ICD-10-CM | POA: Diagnosis not present

## 2016-06-04 DIAGNOSIS — M1712 Unilateral primary osteoarthritis, left knee: Secondary | ICD-10-CM | POA: Diagnosis not present

## 2016-06-09 DIAGNOSIS — J441 Chronic obstructive pulmonary disease with (acute) exacerbation: Secondary | ICD-10-CM | POA: Diagnosis not present

## 2016-06-09 DIAGNOSIS — D72829 Elevated white blood cell count, unspecified: Secondary | ICD-10-CM | POA: Diagnosis not present

## 2016-06-09 DIAGNOSIS — R0602 Shortness of breath: Secondary | ICD-10-CM | POA: Diagnosis not present

## 2016-06-09 DIAGNOSIS — F0390 Unspecified dementia without behavioral disturbance: Secondary | ICD-10-CM | POA: Diagnosis not present

## 2016-06-09 DIAGNOSIS — R0789 Other chest pain: Secondary | ICD-10-CM | POA: Diagnosis not present

## 2016-06-09 DIAGNOSIS — E876 Hypokalemia: Secondary | ICD-10-CM | POA: Diagnosis not present

## 2016-06-09 DIAGNOSIS — R0902 Hypoxemia: Secondary | ICD-10-CM | POA: Diagnosis not present

## 2016-06-09 DIAGNOSIS — R5381 Other malaise: Secondary | ICD-10-CM | POA: Diagnosis not present

## 2016-06-09 DIAGNOSIS — Z6828 Body mass index (BMI) 28.0-28.9, adult: Secondary | ICD-10-CM | POA: Diagnosis not present

## 2016-06-09 DIAGNOSIS — R05 Cough: Secondary | ICD-10-CM | POA: Diagnosis not present

## 2016-06-09 DIAGNOSIS — J440 Chronic obstructive pulmonary disease with acute lower respiratory infection: Secondary | ICD-10-CM | POA: Diagnosis not present

## 2016-06-09 DIAGNOSIS — R5383 Other fatigue: Secondary | ICD-10-CM | POA: Diagnosis not present

## 2016-06-09 DIAGNOSIS — E7800 Pure hypercholesterolemia, unspecified: Secondary | ICD-10-CM | POA: Diagnosis not present

## 2016-06-09 DIAGNOSIS — Z23 Encounter for immunization: Secondary | ICD-10-CM | POA: Diagnosis not present

## 2016-06-09 DIAGNOSIS — E119 Type 2 diabetes mellitus without complications: Secondary | ICD-10-CM | POA: Diagnosis not present

## 2016-06-09 DIAGNOSIS — J189 Pneumonia, unspecified organism: Secondary | ICD-10-CM | POA: Diagnosis not present

## 2016-06-09 DIAGNOSIS — J9600 Acute respiratory failure, unspecified whether with hypoxia or hypercapnia: Secondary | ICD-10-CM | POA: Diagnosis not present

## 2016-06-10 DIAGNOSIS — E876 Hypokalemia: Secondary | ICD-10-CM | POA: Diagnosis not present

## 2016-06-10 DIAGNOSIS — R0902 Hypoxemia: Secondary | ICD-10-CM | POA: Diagnosis not present

## 2016-06-10 DIAGNOSIS — D72829 Elevated white blood cell count, unspecified: Secondary | ICD-10-CM | POA: Diagnosis not present

## 2016-06-10 DIAGNOSIS — J441 Chronic obstructive pulmonary disease with (acute) exacerbation: Secondary | ICD-10-CM | POA: Diagnosis not present

## 2016-06-11 DIAGNOSIS — R0902 Hypoxemia: Secondary | ICD-10-CM | POA: Diagnosis not present

## 2016-06-11 DIAGNOSIS — J441 Chronic obstructive pulmonary disease with (acute) exacerbation: Secondary | ICD-10-CM | POA: Diagnosis not present

## 2016-06-11 DIAGNOSIS — E876 Hypokalemia: Secondary | ICD-10-CM | POA: Diagnosis not present

## 2016-06-11 DIAGNOSIS — D72829 Elevated white blood cell count, unspecified: Secondary | ICD-10-CM | POA: Diagnosis not present

## 2016-06-12 DIAGNOSIS — D72829 Elevated white blood cell count, unspecified: Secondary | ICD-10-CM | POA: Diagnosis not present

## 2016-06-12 DIAGNOSIS — J441 Chronic obstructive pulmonary disease with (acute) exacerbation: Secondary | ICD-10-CM | POA: Diagnosis not present

## 2016-06-12 DIAGNOSIS — R0902 Hypoxemia: Secondary | ICD-10-CM | POA: Diagnosis not present

## 2016-06-12 DIAGNOSIS — E876 Hypokalemia: Secondary | ICD-10-CM | POA: Diagnosis not present

## 2016-06-14 DIAGNOSIS — I10 Essential (primary) hypertension: Secondary | ICD-10-CM | POA: Diagnosis not present

## 2016-06-14 DIAGNOSIS — F0390 Unspecified dementia without behavioral disturbance: Secondary | ICD-10-CM | POA: Diagnosis not present

## 2016-06-14 DIAGNOSIS — E119 Type 2 diabetes mellitus without complications: Secondary | ICD-10-CM | POA: Diagnosis not present

## 2016-06-14 DIAGNOSIS — J441 Chronic obstructive pulmonary disease with (acute) exacerbation: Secondary | ICD-10-CM | POA: Diagnosis not present

## 2016-06-14 DIAGNOSIS — Z7952 Long term (current) use of systemic steroids: Secondary | ICD-10-CM | POA: Diagnosis not present

## 2016-06-14 DIAGNOSIS — F209 Schizophrenia, unspecified: Secondary | ICD-10-CM | POA: Diagnosis not present

## 2016-06-14 DIAGNOSIS — F319 Bipolar disorder, unspecified: Secondary | ICD-10-CM | POA: Diagnosis not present

## 2016-06-14 DIAGNOSIS — Z9981 Dependence on supplemental oxygen: Secondary | ICD-10-CM | POA: Diagnosis not present

## 2016-06-14 DIAGNOSIS — Z7984 Long term (current) use of oral hypoglycemic drugs: Secondary | ICD-10-CM | POA: Diagnosis not present

## 2016-06-15 DIAGNOSIS — J449 Chronic obstructive pulmonary disease, unspecified: Secondary | ICD-10-CM | POA: Diagnosis not present

## 2016-06-17 ENCOUNTER — Telehealth (INDEPENDENT_AMBULATORY_CARE_PROVIDER_SITE_OTHER): Payer: Self-pay | Admitting: Rheumatology

## 2016-06-17 DIAGNOSIS — J441 Chronic obstructive pulmonary disease with (acute) exacerbation: Secondary | ICD-10-CM | POA: Diagnosis not present

## 2016-06-17 NOTE — Telephone Encounter (Signed)
Patient was admitted to the hospital for pneumonia 06/09/16 and was discharged on 06/12/16. Patient received antibiotics and in the hospital and is still currently on antibiotics. Patient's daughter [name] states she gave her mother her normal dose of MTX on Sunday as schedule. Patient's daughter advised to hold MTX until the patient is finished the antibiotic and is well.

## 2016-06-17 NOTE — Telephone Encounter (Signed)
Andrea,Thank you for advising the patient on holding on the methotrexate. I agree with your advice.

## 2016-06-17 NOTE — Telephone Encounter (Signed)
Roe Rutherford called on behalf of [employer].  Karla Clark is needing her methotrexate refilled.  Cb#[phone].  Thank you

## 2016-06-18 DIAGNOSIS — Z7952 Long term (current) use of systemic steroids: Secondary | ICD-10-CM | POA: Diagnosis not present

## 2016-06-18 DIAGNOSIS — I10 Essential (primary) hypertension: Secondary | ICD-10-CM | POA: Diagnosis not present

## 2016-06-18 DIAGNOSIS — F0390 Unspecified dementia without behavioral disturbance: Secondary | ICD-10-CM | POA: Diagnosis not present

## 2016-06-18 DIAGNOSIS — Z7984 Long term (current) use of oral hypoglycemic drugs: Secondary | ICD-10-CM | POA: Diagnosis not present

## 2016-06-18 DIAGNOSIS — E119 Type 2 diabetes mellitus without complications: Secondary | ICD-10-CM | POA: Diagnosis not present

## 2016-06-18 DIAGNOSIS — Z9981 Dependence on supplemental oxygen: Secondary | ICD-10-CM | POA: Diagnosis not present

## 2016-06-18 DIAGNOSIS — F209 Schizophrenia, unspecified: Secondary | ICD-10-CM | POA: Diagnosis not present

## 2016-06-18 DIAGNOSIS — J441 Chronic obstructive pulmonary disease with (acute) exacerbation: Secondary | ICD-10-CM | POA: Diagnosis not present

## 2016-06-18 DIAGNOSIS — F319 Bipolar disorder, unspecified: Secondary | ICD-10-CM | POA: Diagnosis not present

## 2016-06-20 DIAGNOSIS — F0390 Unspecified dementia without behavioral disturbance: Secondary | ICD-10-CM | POA: Diagnosis not present

## 2016-06-20 DIAGNOSIS — Z7984 Long term (current) use of oral hypoglycemic drugs: Secondary | ICD-10-CM | POA: Diagnosis not present

## 2016-06-20 DIAGNOSIS — F209 Schizophrenia, unspecified: Secondary | ICD-10-CM | POA: Diagnosis not present

## 2016-06-20 DIAGNOSIS — I10 Essential (primary) hypertension: Secondary | ICD-10-CM | POA: Diagnosis not present

## 2016-06-20 DIAGNOSIS — J441 Chronic obstructive pulmonary disease with (acute) exacerbation: Secondary | ICD-10-CM | POA: Diagnosis not present

## 2016-06-20 DIAGNOSIS — Z7952 Long term (current) use of systemic steroids: Secondary | ICD-10-CM | POA: Diagnosis not present

## 2016-06-20 DIAGNOSIS — F319 Bipolar disorder, unspecified: Secondary | ICD-10-CM | POA: Diagnosis not present

## 2016-06-20 DIAGNOSIS — E119 Type 2 diabetes mellitus without complications: Secondary | ICD-10-CM | POA: Diagnosis not present

## 2016-06-20 DIAGNOSIS — Z9981 Dependence on supplemental oxygen: Secondary | ICD-10-CM | POA: Diagnosis not present

## 2016-06-21 DIAGNOSIS — J441 Chronic obstructive pulmonary disease with (acute) exacerbation: Secondary | ICD-10-CM | POA: Diagnosis not present

## 2016-06-21 DIAGNOSIS — Z7984 Long term (current) use of oral hypoglycemic drugs: Secondary | ICD-10-CM | POA: Diagnosis not present

## 2016-06-21 DIAGNOSIS — E119 Type 2 diabetes mellitus without complications: Secondary | ICD-10-CM | POA: Diagnosis not present

## 2016-06-21 DIAGNOSIS — F0390 Unspecified dementia without behavioral disturbance: Secondary | ICD-10-CM | POA: Diagnosis not present

## 2016-06-21 DIAGNOSIS — F209 Schizophrenia, unspecified: Secondary | ICD-10-CM | POA: Diagnosis not present

## 2016-06-21 DIAGNOSIS — F319 Bipolar disorder, unspecified: Secondary | ICD-10-CM | POA: Diagnosis not present

## 2016-06-21 DIAGNOSIS — I10 Essential (primary) hypertension: Secondary | ICD-10-CM | POA: Diagnosis not present

## 2016-06-21 DIAGNOSIS — Z9981 Dependence on supplemental oxygen: Secondary | ICD-10-CM | POA: Diagnosis not present

## 2016-06-21 DIAGNOSIS — Z7952 Long term (current) use of systemic steroids: Secondary | ICD-10-CM | POA: Diagnosis not present

## 2016-06-22 DIAGNOSIS — S199XXA Unspecified injury of neck, initial encounter: Secondary | ICD-10-CM | POA: Diagnosis not present

## 2016-06-22 DIAGNOSIS — S0003XA Contusion of scalp, initial encounter: Secondary | ICD-10-CM | POA: Diagnosis not present

## 2016-06-22 DIAGNOSIS — S0990XA Unspecified injury of head, initial encounter: Secondary | ICD-10-CM | POA: Diagnosis not present

## 2016-06-22 DIAGNOSIS — S0083XA Contusion of other part of head, initial encounter: Secondary | ICD-10-CM | POA: Diagnosis not present

## 2016-06-23 DIAGNOSIS — F209 Schizophrenia, unspecified: Secondary | ICD-10-CM | POA: Diagnosis not present

## 2016-06-23 DIAGNOSIS — E119 Type 2 diabetes mellitus without complications: Secondary | ICD-10-CM | POA: Diagnosis not present

## 2016-06-23 DIAGNOSIS — F319 Bipolar disorder, unspecified: Secondary | ICD-10-CM | POA: Diagnosis not present

## 2016-06-23 DIAGNOSIS — I10 Essential (primary) hypertension: Secondary | ICD-10-CM | POA: Diagnosis not present

## 2016-06-23 DIAGNOSIS — Z7984 Long term (current) use of oral hypoglycemic drugs: Secondary | ICD-10-CM | POA: Diagnosis not present

## 2016-06-23 DIAGNOSIS — J441 Chronic obstructive pulmonary disease with (acute) exacerbation: Secondary | ICD-10-CM | POA: Diagnosis not present

## 2016-06-23 DIAGNOSIS — Z9981 Dependence on supplemental oxygen: Secondary | ICD-10-CM | POA: Diagnosis not present

## 2016-06-23 DIAGNOSIS — F0390 Unspecified dementia without behavioral disturbance: Secondary | ICD-10-CM | POA: Diagnosis not present

## 2016-06-23 DIAGNOSIS — Z7952 Long term (current) use of systemic steroids: Secondary | ICD-10-CM | POA: Diagnosis not present

## 2016-06-24 DIAGNOSIS — Z7189 Other specified counseling: Secondary | ICD-10-CM | POA: Diagnosis not present

## 2016-06-24 DIAGNOSIS — W19XXXA Unspecified fall, initial encounter: Secondary | ICD-10-CM | POA: Diagnosis not present

## 2016-06-24 DIAGNOSIS — S0083XD Contusion of other part of head, subsequent encounter: Secondary | ICD-10-CM | POA: Diagnosis not present

## 2016-06-24 DIAGNOSIS — J441 Chronic obstructive pulmonary disease with (acute) exacerbation: Secondary | ICD-10-CM | POA: Diagnosis not present

## 2016-06-25 DIAGNOSIS — Z7952 Long term (current) use of systemic steroids: Secondary | ICD-10-CM | POA: Diagnosis not present

## 2016-06-25 DIAGNOSIS — I10 Essential (primary) hypertension: Secondary | ICD-10-CM | POA: Diagnosis not present

## 2016-06-25 DIAGNOSIS — F0390 Unspecified dementia without behavioral disturbance: Secondary | ICD-10-CM | POA: Diagnosis not present

## 2016-06-25 DIAGNOSIS — Z9981 Dependence on supplemental oxygen: Secondary | ICD-10-CM | POA: Diagnosis not present

## 2016-06-25 DIAGNOSIS — F209 Schizophrenia, unspecified: Secondary | ICD-10-CM | POA: Diagnosis not present

## 2016-06-25 DIAGNOSIS — F319 Bipolar disorder, unspecified: Secondary | ICD-10-CM | POA: Diagnosis not present

## 2016-06-25 DIAGNOSIS — Z7984 Long term (current) use of oral hypoglycemic drugs: Secondary | ICD-10-CM | POA: Diagnosis not present

## 2016-06-25 DIAGNOSIS — E119 Type 2 diabetes mellitus without complications: Secondary | ICD-10-CM | POA: Diagnosis not present

## 2016-06-25 DIAGNOSIS — J441 Chronic obstructive pulmonary disease with (acute) exacerbation: Secondary | ICD-10-CM | POA: Diagnosis not present

## 2016-06-28 DIAGNOSIS — Z7952 Long term (current) use of systemic steroids: Secondary | ICD-10-CM | POA: Diagnosis not present

## 2016-06-28 DIAGNOSIS — E119 Type 2 diabetes mellitus without complications: Secondary | ICD-10-CM | POA: Diagnosis not present

## 2016-06-28 DIAGNOSIS — F0390 Unspecified dementia without behavioral disturbance: Secondary | ICD-10-CM | POA: Diagnosis not present

## 2016-06-28 DIAGNOSIS — Z9981 Dependence on supplemental oxygen: Secondary | ICD-10-CM | POA: Diagnosis not present

## 2016-06-28 DIAGNOSIS — I10 Essential (primary) hypertension: Secondary | ICD-10-CM | POA: Diagnosis not present

## 2016-06-28 DIAGNOSIS — F319 Bipolar disorder, unspecified: Secondary | ICD-10-CM | POA: Diagnosis not present

## 2016-06-28 DIAGNOSIS — Z7984 Long term (current) use of oral hypoglycemic drugs: Secondary | ICD-10-CM | POA: Diagnosis not present

## 2016-06-28 DIAGNOSIS — J441 Chronic obstructive pulmonary disease with (acute) exacerbation: Secondary | ICD-10-CM | POA: Diagnosis not present

## 2016-06-28 DIAGNOSIS — F209 Schizophrenia, unspecified: Secondary | ICD-10-CM | POA: Diagnosis not present

## 2016-06-30 DIAGNOSIS — F0390 Unspecified dementia without behavioral disturbance: Secondary | ICD-10-CM | POA: Diagnosis not present

## 2016-06-30 DIAGNOSIS — E119 Type 2 diabetes mellitus without complications: Secondary | ICD-10-CM | POA: Diagnosis not present

## 2016-06-30 DIAGNOSIS — Z9981 Dependence on supplemental oxygen: Secondary | ICD-10-CM | POA: Diagnosis not present

## 2016-06-30 DIAGNOSIS — F209 Schizophrenia, unspecified: Secondary | ICD-10-CM | POA: Diagnosis not present

## 2016-06-30 DIAGNOSIS — Z7952 Long term (current) use of systemic steroids: Secondary | ICD-10-CM | POA: Diagnosis not present

## 2016-06-30 DIAGNOSIS — J441 Chronic obstructive pulmonary disease with (acute) exacerbation: Secondary | ICD-10-CM | POA: Diagnosis not present

## 2016-06-30 DIAGNOSIS — I10 Essential (primary) hypertension: Secondary | ICD-10-CM | POA: Diagnosis not present

## 2016-06-30 DIAGNOSIS — F319 Bipolar disorder, unspecified: Secondary | ICD-10-CM | POA: Diagnosis not present

## 2016-06-30 DIAGNOSIS — Z7984 Long term (current) use of oral hypoglycemic drugs: Secondary | ICD-10-CM | POA: Diagnosis not present

## 2016-07-05 DIAGNOSIS — E119 Type 2 diabetes mellitus without complications: Secondary | ICD-10-CM | POA: Diagnosis not present

## 2016-07-05 DIAGNOSIS — F319 Bipolar disorder, unspecified: Secondary | ICD-10-CM | POA: Diagnosis not present

## 2016-07-05 DIAGNOSIS — F0390 Unspecified dementia without behavioral disturbance: Secondary | ICD-10-CM | POA: Diagnosis not present

## 2016-07-05 DIAGNOSIS — F209 Schizophrenia, unspecified: Secondary | ICD-10-CM | POA: Diagnosis not present

## 2016-07-05 DIAGNOSIS — I10 Essential (primary) hypertension: Secondary | ICD-10-CM | POA: Diagnosis not present

## 2016-07-05 DIAGNOSIS — Z9981 Dependence on supplemental oxygen: Secondary | ICD-10-CM | POA: Diagnosis not present

## 2016-07-05 DIAGNOSIS — J441 Chronic obstructive pulmonary disease with (acute) exacerbation: Secondary | ICD-10-CM | POA: Diagnosis not present

## 2016-07-05 DIAGNOSIS — Z7952 Long term (current) use of systemic steroids: Secondary | ICD-10-CM | POA: Diagnosis not present

## 2016-07-05 DIAGNOSIS — Z7984 Long term (current) use of oral hypoglycemic drugs: Secondary | ICD-10-CM | POA: Diagnosis not present

## 2016-07-07 DIAGNOSIS — I10 Essential (primary) hypertension: Secondary | ICD-10-CM | POA: Diagnosis not present

## 2016-07-07 DIAGNOSIS — E119 Type 2 diabetes mellitus without complications: Secondary | ICD-10-CM | POA: Diagnosis not present

## 2016-07-07 DIAGNOSIS — Z7952 Long term (current) use of systemic steroids: Secondary | ICD-10-CM | POA: Diagnosis not present

## 2016-07-07 DIAGNOSIS — Z7984 Long term (current) use of oral hypoglycemic drugs: Secondary | ICD-10-CM | POA: Diagnosis not present

## 2016-07-07 DIAGNOSIS — F0390 Unspecified dementia without behavioral disturbance: Secondary | ICD-10-CM | POA: Diagnosis not present

## 2016-07-07 DIAGNOSIS — F319 Bipolar disorder, unspecified: Secondary | ICD-10-CM | POA: Diagnosis not present

## 2016-07-07 DIAGNOSIS — J441 Chronic obstructive pulmonary disease with (acute) exacerbation: Secondary | ICD-10-CM | POA: Diagnosis not present

## 2016-07-07 DIAGNOSIS — F209 Schizophrenia, unspecified: Secondary | ICD-10-CM | POA: Diagnosis not present

## 2016-07-07 DIAGNOSIS — Z9981 Dependence on supplemental oxygen: Secondary | ICD-10-CM | POA: Diagnosis not present

## 2016-07-08 DIAGNOSIS — Y92009 Unspecified place in unspecified non-institutional (private) residence as the place of occurrence of the external cause: Secondary | ICD-10-CM | POA: Diagnosis not present

## 2016-07-08 DIAGNOSIS — W19XXXA Unspecified fall, initial encounter: Secondary | ICD-10-CM | POA: Diagnosis not present

## 2016-07-08 DIAGNOSIS — J449 Chronic obstructive pulmonary disease, unspecified: Secondary | ICD-10-CM | POA: Diagnosis not present

## 2016-07-09 DIAGNOSIS — Z9981 Dependence on supplemental oxygen: Secondary | ICD-10-CM | POA: Diagnosis not present

## 2016-07-09 DIAGNOSIS — F0390 Unspecified dementia without behavioral disturbance: Secondary | ICD-10-CM | POA: Diagnosis not present

## 2016-07-09 DIAGNOSIS — Z7952 Long term (current) use of systemic steroids: Secondary | ICD-10-CM | POA: Diagnosis not present

## 2016-07-09 DIAGNOSIS — J441 Chronic obstructive pulmonary disease with (acute) exacerbation: Secondary | ICD-10-CM | POA: Diagnosis not present

## 2016-07-09 DIAGNOSIS — F209 Schizophrenia, unspecified: Secondary | ICD-10-CM | POA: Diagnosis not present

## 2016-07-09 DIAGNOSIS — F319 Bipolar disorder, unspecified: Secondary | ICD-10-CM | POA: Diagnosis not present

## 2016-07-09 DIAGNOSIS — E119 Type 2 diabetes mellitus without complications: Secondary | ICD-10-CM | POA: Diagnosis not present

## 2016-07-09 DIAGNOSIS — Z7984 Long term (current) use of oral hypoglycemic drugs: Secondary | ICD-10-CM | POA: Diagnosis not present

## 2016-07-09 DIAGNOSIS — I10 Essential (primary) hypertension: Secondary | ICD-10-CM | POA: Diagnosis not present

## 2016-07-10 DIAGNOSIS — J441 Chronic obstructive pulmonary disease with (acute) exacerbation: Secondary | ICD-10-CM | POA: Diagnosis not present

## 2016-07-10 DIAGNOSIS — D72829 Elevated white blood cell count, unspecified: Secondary | ICD-10-CM | POA: Diagnosis not present

## 2016-07-10 DIAGNOSIS — R0902 Hypoxemia: Secondary | ICD-10-CM | POA: Diagnosis not present

## 2016-07-10 DIAGNOSIS — J440 Chronic obstructive pulmonary disease with acute lower respiratory infection: Secondary | ICD-10-CM | POA: Diagnosis not present

## 2016-07-12 DIAGNOSIS — F209 Schizophrenia, unspecified: Secondary | ICD-10-CM | POA: Diagnosis not present

## 2016-07-12 DIAGNOSIS — Z9981 Dependence on supplemental oxygen: Secondary | ICD-10-CM | POA: Diagnosis not present

## 2016-07-12 DIAGNOSIS — F319 Bipolar disorder, unspecified: Secondary | ICD-10-CM | POA: Diagnosis not present

## 2016-07-12 DIAGNOSIS — J441 Chronic obstructive pulmonary disease with (acute) exacerbation: Secondary | ICD-10-CM | POA: Diagnosis not present

## 2016-07-12 DIAGNOSIS — Z7984 Long term (current) use of oral hypoglycemic drugs: Secondary | ICD-10-CM | POA: Diagnosis not present

## 2016-07-12 DIAGNOSIS — I10 Essential (primary) hypertension: Secondary | ICD-10-CM | POA: Diagnosis not present

## 2016-07-12 DIAGNOSIS — F0390 Unspecified dementia without behavioral disturbance: Secondary | ICD-10-CM | POA: Diagnosis not present

## 2016-07-12 DIAGNOSIS — E119 Type 2 diabetes mellitus without complications: Secondary | ICD-10-CM | POA: Diagnosis not present

## 2016-07-12 DIAGNOSIS — Z7952 Long term (current) use of systemic steroids: Secondary | ICD-10-CM | POA: Diagnosis not present

## 2016-07-14 DIAGNOSIS — I10 Essential (primary) hypertension: Secondary | ICD-10-CM | POA: Diagnosis not present

## 2016-07-14 DIAGNOSIS — F0390 Unspecified dementia without behavioral disturbance: Secondary | ICD-10-CM | POA: Diagnosis not present

## 2016-07-14 DIAGNOSIS — E1159 Type 2 diabetes mellitus with other circulatory complications: Secondary | ICD-10-CM | POA: Diagnosis not present

## 2016-07-14 DIAGNOSIS — E1140 Type 2 diabetes mellitus with diabetic neuropathy, unspecified: Secondary | ICD-10-CM | POA: Diagnosis not present

## 2016-07-14 DIAGNOSIS — E039 Hypothyroidism, unspecified: Secondary | ICD-10-CM | POA: Diagnosis not present

## 2016-07-14 DIAGNOSIS — F209 Schizophrenia, unspecified: Secondary | ICD-10-CM | POA: Diagnosis not present

## 2016-07-14 DIAGNOSIS — F319 Bipolar disorder, unspecified: Secondary | ICD-10-CM | POA: Diagnosis not present

## 2016-07-14 DIAGNOSIS — E119 Type 2 diabetes mellitus without complications: Secondary | ICD-10-CM | POA: Diagnosis not present

## 2016-07-14 DIAGNOSIS — Z7952 Long term (current) use of systemic steroids: Secondary | ICD-10-CM | POA: Diagnosis not present

## 2016-07-14 DIAGNOSIS — J441 Chronic obstructive pulmonary disease with (acute) exacerbation: Secondary | ICD-10-CM | POA: Diagnosis not present

## 2016-07-14 DIAGNOSIS — Z7984 Long term (current) use of oral hypoglycemic drugs: Secondary | ICD-10-CM | POA: Diagnosis not present

## 2016-07-14 DIAGNOSIS — Z9981 Dependence on supplemental oxygen: Secondary | ICD-10-CM | POA: Diagnosis not present

## 2016-07-19 DIAGNOSIS — J441 Chronic obstructive pulmonary disease with (acute) exacerbation: Secondary | ICD-10-CM | POA: Diagnosis not present

## 2016-07-19 DIAGNOSIS — I10 Essential (primary) hypertension: Secondary | ICD-10-CM | POA: Diagnosis not present

## 2016-07-19 DIAGNOSIS — F209 Schizophrenia, unspecified: Secondary | ICD-10-CM | POA: Diagnosis not present

## 2016-07-19 DIAGNOSIS — M19071 Primary osteoarthritis, right ankle and foot: Secondary | ICD-10-CM | POA: Insufficient documentation

## 2016-07-19 DIAGNOSIS — E119 Type 2 diabetes mellitus without complications: Secondary | ICD-10-CM | POA: Diagnosis not present

## 2016-07-19 DIAGNOSIS — M19042 Primary osteoarthritis, left hand: Secondary | ICD-10-CM

## 2016-07-19 DIAGNOSIS — F0390 Unspecified dementia without behavioral disturbance: Secondary | ICD-10-CM | POA: Diagnosis not present

## 2016-07-19 DIAGNOSIS — M19072 Primary osteoarthritis, left ankle and foot: Secondary | ICD-10-CM

## 2016-07-19 DIAGNOSIS — M170 Bilateral primary osteoarthritis of knee: Secondary | ICD-10-CM | POA: Insufficient documentation

## 2016-07-19 DIAGNOSIS — Z7952 Long term (current) use of systemic steroids: Secondary | ICD-10-CM | POA: Diagnosis not present

## 2016-07-19 DIAGNOSIS — Z9981 Dependence on supplemental oxygen: Secondary | ICD-10-CM | POA: Diagnosis not present

## 2016-07-19 DIAGNOSIS — Z7984 Long term (current) use of oral hypoglycemic drugs: Secondary | ICD-10-CM | POA: Diagnosis not present

## 2016-07-19 DIAGNOSIS — M19041 Primary osteoarthritis, right hand: Secondary | ICD-10-CM | POA: Insufficient documentation

## 2016-07-19 DIAGNOSIS — F319 Bipolar disorder, unspecified: Secondary | ICD-10-CM | POA: Diagnosis not present

## 2016-07-19 DIAGNOSIS — Z79899 Other long term (current) drug therapy: Secondary | ICD-10-CM | POA: Insufficient documentation

## 2016-07-19 DIAGNOSIS — M0579 Rheumatoid arthritis with rheumatoid factor of multiple sites without organ or systems involvement: Secondary | ICD-10-CM | POA: Insufficient documentation

## 2016-07-19 NOTE — Progress Notes (Signed)
Office Visit Note  Patient: Karla Clark             Date of Birth: 1945-07-30           MRN: [id number]             PCP: Maryella Shivers, MD Referring: Maryella Shivers, MD Visit Date: 07/22/2016 Occupation: @GUAROCC @    Subjective:  myalgia   History of Present Illness: Karla Clark is a 71 y.o. female with history of rheumatoid arthritis. She's not taking any medications for rheumatoid arthritis. She states she's not feeling well and she's having more tremors. She denies any joint swelling. Although she continues to ache all over. She had pneumonia last month. She still recovering from that and using oxygen at nighttime. Methotrexate was stopped when she got sick.  Activities of Daily Living:  Patient reports morning stiffness for 1 hour.   Patient Reports nocturnal pain.  Difficulty dressing/grooming: Denies Difficulty climbing stairs: Reports Difficulty getting out of chair: Reports Difficulty using hands for taps, buttons, cutlery, and/or writing: Reports   Review of Systems  Constitutional: Positive for fatigue. Negative for night sweats, weight gain, weight loss and weakness.  HENT: Negative for mouth sores, trouble swallowing, trouble swallowing, mouth dryness and nose dryness.   Eyes: Negative for pain, redness, visual disturbance and dryness.  Respiratory: Negative for cough, shortness of breath and difficulty breathing.   Cardiovascular: Negative for chest pain, palpitations, hypertension, irregular heartbeat and swelling in legs/feet.  Gastrointestinal: Negative for blood in stool, constipation and diarrhea.  Endocrine: Negative for increased urination.  Genitourinary: Negative for vaginal dryness.  Musculoskeletal: Positive for arthralgias, joint pain, myalgias, morning stiffness and myalgias. Negative for joint swelling, muscle weakness and muscle tenderness.  Skin: Negative for color change, rash, hair loss, skin tightness, ulcers and sensitivity to  sunlight.  Allergic/Immunologic: Negative for susceptible to infections.  Neurological: Negative for dizziness, memory loss and night sweats.  Hematological: Negative for swollen glands.  Psychiatric/Behavioral: Positive for sleep disturbance. Negative for depressed mood. The patient is not nervous/anxious.     PMFS History:  Patient Active Problem List   Diagnosis Date Noted  . Rheumatoid arthritis with rheumatoid factor of multiple sites without organ or systems involvement ([place]) 07/19/2016  . High risk medications (not anticoagulants) long-term use 07/19/2016  . Osteoarthritis of both knees 07/19/2016  . Osteoarthritis of both hands 07/19/2016  . Primary osteoarthritis of both feet 07/19/2016    Past Medical History:  Diagnosis Date  . Anxiety   . COPD (chronic obstructive pulmonary disease) ([place])   . Diabetes mellitus without complication ([place])   . Elevated cholesterol   . Hypertension   . Hypothyroidism   . Osteoarthritis   . RA (rheumatoid arthritis) ([place])   . 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 No pertinent family history. Past Surgical History:  Procedure Laterality Date  . ABDOMINAL HYSTERECTOMY     Social History   Social History Narrative  . No narrative on file     Objective: Vital Signs: BP 133/77   Pulse 98   Resp 15   Ht 5' 1"  (1.549 m)   Wt 146 lb (66.2 kg)   BMI 27.59 kg/m    Physical Exam  Constitutional: She is oriented to person, place, and time. She appears well-developed and well-nourished.  HENT:  Head: Normocephalic and atraumatic.  Eyes: Conjunctivae and EOM are normal.  Neck: Normal range of motion.  Cardiovascular: Normal rate, regular rhythm, normal heart  sounds and intact distal pulses.   Pulmonary/Chest: Effort normal and breath sounds normal.  Abdominal: Soft. Bowel sounds are normal.  Lymphadenopathy:    She has no cervical adenopathy.  Neurological: She is alert and oriented to person, place, and time.    Skin: Skin is warm and dry. Capillary refill takes less than 2 seconds.  Psychiatric: She has a normal mood and affect. Her behavior is normal.  Nursing note and vitals reviewed.    Musculoskeletal Exam: C-spine and thoracic lumbar spine range of motion was difficult to assess due to poor corporation. Shoulder joints she was able to move to 90 elbow joints are good range of motion. She has ulnar deviation all of her MCP joints with incomplete extension although no synovitis in wrists joint or MCP joints was noted. She has some warmth on palpation of her right knee. She is some RA changes in her MTPs and PIPs but no active synovitis was noted.  CDAI Exam: CDAI Homunculus Exam:   Tenderness:  RLE: tibiofemoral  Swelling:  RLE: tibiofemoral  Joint Counts:  CDAI Tender Joint count: 1 CDAI Swollen Joint count: 1  Global Assessments:  Patient Global Assessment: 5 Provider Global Assessment: 3  CDAI Calculated Score: 10    Investigation: No additional findings. No visits with results within 3 Month(s) from this visit.  Latest known visit with results is:  Office Visit on 03/11/2016  Component Date Value Ref Range Status  . Sodium 03/11/2016 136  135 - 146 mmol/L Final  . Potassium 03/11/2016 4.0  3.5 - 5.3 mmol/L Final  . Chloride 03/11/2016 102  98 - 110 mmol/L Final  . CO2 03/11/2016 23  20 - 31 mmol/L Final  . Glucose, Bld 03/11/2016 94  65 - 99 mg/dL Final  . BUN 03/11/2016 10  7 - 25 mg/dL Final  . Creat 03/11/2016 0.71  0.60 - 0.93 mg/dL Final   Comment:   For patients > or = 73 years of age: The upper reference limit for Creatinine is approximately 13% higher for people identified as African-American.     . Total Bilirubin 03/11/2016 0.3  0.2 - 1.2 mg/dL Final  . Alkaline Phosphatase 03/11/2016 73  33 - 130 U/L Final  . AST 03/11/2016 17  10 - 35 U/L Final  . ALT 03/11/2016 9  6 - 29 U/L Final  . Total Protein 03/11/2016 6.1  6.1 - 8.1 g/dL Final  . Albumin  03/11/2016 3.8  3.6 - 5.1 g/dL Final  . Calcium 03/11/2016 8.8  8.6 - 10.4 mg/dL Final  . GFR, Est African American 03/11/2016 >89  >=60 mL/min Final  . GFR, Est Non African American 03/11/2016 86  >=60 mL/min Final  . WBC 03/11/2016 7.6  3.8 - 10.8 K/uL Final  . RBC 03/11/2016 3.89  3.80 - 5.10 MIL/uL Final  . Hemoglobin 03/11/2016 12.3  11.7 - 15.5 g/dL Final  . HCT 03/11/2016 38.8  35.0 - 45.0 % Final  . MCV 03/11/2016 99.7  80.0 - 100.0 fL Final  . MCH 03/11/2016 31.6  27.0 - 33.0 pg Final  . MCHC 03/11/2016 31.7* 32.0 - 36.0 g/dL Final  . RDW 03/11/2016 17.8* 11.0 - 15.0 % Final  . Platelets 03/11/2016 297  140 - 400 K/uL Final  . MPV 03/11/2016 9.0  7.5 - 12.5 fL Final  . Neutro Abs 03/11/2016 5244  1,500 - 7,800 cells/uL Final  . Lymphs Abs 03/11/2016 1444  850 - 3,900 cells/uL Final  . Monocytes Absolute  03/11/2016 684  200 - 950 cells/uL Final  . Eosinophils Absolute 03/11/2016 228  15 - 500 cells/uL Final  . Basophils Absolute 03/11/2016 0  0 - 200 cells/uL Final  . Neutrophils Relative % 03/11/2016 69  % Final  . Lymphocytes Relative 03/11/2016 19  % Final  . Monocytes Relative 03/11/2016 9  % Final  . Eosinophils Relative 03/11/2016 3  % Final  . Basophils Relative 03/11/2016 0  % Final  . Smear Review 03/11/2016 Criteria for review not met   Final     Imaging: No results found.  Speciality Comments: No specialty comments available.    Procedures:  No procedures performed Allergies: Ace inhibitors   Assessment / Plan:     Visit Diagnoses: Rheumatoid arthritis with rheumatoid factor of multiple sites without organ or systems involvement [facility]): Patient has severe rheumatoid arthritis with lot deformities but does not have any synovitis on examination today. I'm uncertain if methotrexate is really helping her or not it appears that she has more of a burned out rheumatoid arthritis. According to her daughter she continues to be in pain with and without methotrexate.  She states she only gets relief by taking anti-inflammatories. Side effects of anti-inflammatories were emphasized. I advised her to use Tylenol instead for pain.  High risk medications (not anticoagulants) long-term use: Methotrexate is on hold. Her last labs were normal. We had detailed discussion regarding methotrexate and side effects. Her daughter was concerned that methotrexate increases her risk of infection with a recent pneumonia. She's uncertain if she will want to put her back on methotrexate at this time. I've advised her to contact me in case she was to resume methotrexate or her arthritis gets worse.  Primary osteoarthritis of both hands minimal changes  Primary osteoarthritis of both knees: Chronic pain  Primary osteoarthritis of both feet and chronic pain   Orders: No orders of the defined types were placed in this encounter.  No orders of the defined types were placed in this encounter.   Face-to-face time spent with patient was 25 minutes. 50% of time was spent in counseling and coordination of care.  Follow-Up Instructions: Return for Rheumatoid arthritis.   Bo Merino, MD  Note - This record has been created using Editor, commissioning.  Chart creation errors have been sought, but may not always  have been located. Such creation errors do not reflect on  the standard of medical care.

## 2016-07-21 DIAGNOSIS — Z7984 Long term (current) use of oral hypoglycemic drugs: Secondary | ICD-10-CM | POA: Diagnosis not present

## 2016-07-21 DIAGNOSIS — F319 Bipolar disorder, unspecified: Secondary | ICD-10-CM | POA: Diagnosis not present

## 2016-07-21 DIAGNOSIS — J441 Chronic obstructive pulmonary disease with (acute) exacerbation: Secondary | ICD-10-CM | POA: Diagnosis not present

## 2016-07-21 DIAGNOSIS — I10 Essential (primary) hypertension: Secondary | ICD-10-CM | POA: Diagnosis not present

## 2016-07-21 DIAGNOSIS — E1140 Type 2 diabetes mellitus with diabetic neuropathy, unspecified: Secondary | ICD-10-CM | POA: Diagnosis not present

## 2016-07-21 DIAGNOSIS — Z7952 Long term (current) use of systemic steroids: Secondary | ICD-10-CM | POA: Diagnosis not present

## 2016-07-21 DIAGNOSIS — F209 Schizophrenia, unspecified: Secondary | ICD-10-CM | POA: Diagnosis not present

## 2016-07-21 DIAGNOSIS — E785 Hyperlipidemia, unspecified: Secondary | ICD-10-CM | POA: Diagnosis not present

## 2016-07-21 DIAGNOSIS — F0390 Unspecified dementia without behavioral disturbance: Secondary | ICD-10-CM | POA: Diagnosis not present

## 2016-07-21 DIAGNOSIS — E119 Type 2 diabetes mellitus without complications: Secondary | ICD-10-CM | POA: Diagnosis not present

## 2016-07-21 DIAGNOSIS — Z9981 Dependence on supplemental oxygen: Secondary | ICD-10-CM | POA: Diagnosis not present

## 2016-07-21 DIAGNOSIS — E1159 Type 2 diabetes mellitus with other circulatory complications: Secondary | ICD-10-CM | POA: Diagnosis not present

## 2016-07-22 ENCOUNTER — Ambulatory Visit (INDEPENDENT_AMBULATORY_CARE_PROVIDER_SITE_OTHER): Payer: Medicare PPO | Admitting: Rheumatology

## 2016-07-22 ENCOUNTER — Encounter: Payer: Self-pay | Admitting: Rheumatology

## 2016-07-22 VITALS — BP 133/77 | HR 98 | Resp 15 | Ht 61.0 in | Wt 146.0 lb

## 2016-07-22 DIAGNOSIS — M19071 Primary osteoarthritis, right ankle and foot: Secondary | ICD-10-CM | POA: Diagnosis not present

## 2016-07-22 DIAGNOSIS — M0579 Rheumatoid arthritis with rheumatoid factor of multiple sites without organ or systems involvement: Secondary | ICD-10-CM

## 2016-07-22 DIAGNOSIS — M19041 Primary osteoarthritis, right hand: Secondary | ICD-10-CM | POA: Diagnosis not present

## 2016-07-22 DIAGNOSIS — Z79899 Other long term (current) drug therapy: Secondary | ICD-10-CM | POA: Diagnosis not present

## 2016-07-22 DIAGNOSIS — M19072 Primary osteoarthritis, left ankle and foot: Secondary | ICD-10-CM | POA: Diagnosis not present

## 2016-07-22 DIAGNOSIS — M19042 Primary osteoarthritis, left hand: Secondary | ICD-10-CM

## 2016-07-22 DIAGNOSIS — M170 Bilateral primary osteoarthritis of knee: Secondary | ICD-10-CM | POA: Diagnosis not present

## 2016-07-26 DIAGNOSIS — F319 Bipolar disorder, unspecified: Secondary | ICD-10-CM | POA: Diagnosis not present

## 2016-07-26 DIAGNOSIS — Z7984 Long term (current) use of oral hypoglycemic drugs: Secondary | ICD-10-CM | POA: Diagnosis not present

## 2016-07-26 DIAGNOSIS — I10 Essential (primary) hypertension: Secondary | ICD-10-CM | POA: Diagnosis not present

## 2016-07-26 DIAGNOSIS — E119 Type 2 diabetes mellitus without complications: Secondary | ICD-10-CM | POA: Diagnosis not present

## 2016-07-26 DIAGNOSIS — J441 Chronic obstructive pulmonary disease with (acute) exacerbation: Secondary | ICD-10-CM | POA: Diagnosis not present

## 2016-07-26 DIAGNOSIS — F209 Schizophrenia, unspecified: Secondary | ICD-10-CM | POA: Diagnosis not present

## 2016-07-26 DIAGNOSIS — Z9981 Dependence on supplemental oxygen: Secondary | ICD-10-CM | POA: Diagnosis not present

## 2016-07-26 DIAGNOSIS — F0390 Unspecified dementia without behavioral disturbance: Secondary | ICD-10-CM | POA: Diagnosis not present

## 2016-07-26 DIAGNOSIS — Z7952 Long term (current) use of systemic steroids: Secondary | ICD-10-CM | POA: Diagnosis not present

## 2016-07-28 DIAGNOSIS — F209 Schizophrenia, unspecified: Secondary | ICD-10-CM | POA: Diagnosis not present

## 2016-07-28 DIAGNOSIS — F319 Bipolar disorder, unspecified: Secondary | ICD-10-CM | POA: Diagnosis not present

## 2016-07-28 DIAGNOSIS — J441 Chronic obstructive pulmonary disease with (acute) exacerbation: Secondary | ICD-10-CM | POA: Diagnosis not present

## 2016-07-28 DIAGNOSIS — I10 Essential (primary) hypertension: Secondary | ICD-10-CM | POA: Diagnosis not present

## 2016-07-28 DIAGNOSIS — Z9981 Dependence on supplemental oxygen: Secondary | ICD-10-CM | POA: Diagnosis not present

## 2016-07-28 DIAGNOSIS — Z7984 Long term (current) use of oral hypoglycemic drugs: Secondary | ICD-10-CM | POA: Diagnosis not present

## 2016-07-28 DIAGNOSIS — Z7952 Long term (current) use of systemic steroids: Secondary | ICD-10-CM | POA: Diagnosis not present

## 2016-07-28 DIAGNOSIS — F0390 Unspecified dementia without behavioral disturbance: Secondary | ICD-10-CM | POA: Diagnosis not present

## 2016-07-28 DIAGNOSIS — E119 Type 2 diabetes mellitus without complications: Secondary | ICD-10-CM | POA: Diagnosis not present

## 2016-07-29 DIAGNOSIS — Z1231 Encounter for screening mammogram for malignant neoplasm of breast: Secondary | ICD-10-CM | POA: Diagnosis not present

## 2016-08-02 DIAGNOSIS — Z7984 Long term (current) use of oral hypoglycemic drugs: Secondary | ICD-10-CM | POA: Diagnosis not present

## 2016-08-02 DIAGNOSIS — F209 Schizophrenia, unspecified: Secondary | ICD-10-CM | POA: Diagnosis not present

## 2016-08-02 DIAGNOSIS — F319 Bipolar disorder, unspecified: Secondary | ICD-10-CM | POA: Diagnosis not present

## 2016-08-02 DIAGNOSIS — Z9981 Dependence on supplemental oxygen: Secondary | ICD-10-CM | POA: Diagnosis not present

## 2016-08-02 DIAGNOSIS — J441 Chronic obstructive pulmonary disease with (acute) exacerbation: Secondary | ICD-10-CM | POA: Diagnosis not present

## 2016-08-02 DIAGNOSIS — E119 Type 2 diabetes mellitus without complications: Secondary | ICD-10-CM | POA: Diagnosis not present

## 2016-08-02 DIAGNOSIS — I10 Essential (primary) hypertension: Secondary | ICD-10-CM | POA: Diagnosis not present

## 2016-08-02 DIAGNOSIS — Z7952 Long term (current) use of systemic steroids: Secondary | ICD-10-CM | POA: Diagnosis not present

## 2016-08-02 DIAGNOSIS — F0390 Unspecified dementia without behavioral disturbance: Secondary | ICD-10-CM | POA: Diagnosis not present

## 2016-08-05 DIAGNOSIS — Z9981 Dependence on supplemental oxygen: Secondary | ICD-10-CM | POA: Diagnosis not present

## 2016-08-05 DIAGNOSIS — F319 Bipolar disorder, unspecified: Secondary | ICD-10-CM | POA: Diagnosis not present

## 2016-08-05 DIAGNOSIS — E119 Type 2 diabetes mellitus without complications: Secondary | ICD-10-CM | POA: Diagnosis not present

## 2016-08-05 DIAGNOSIS — I10 Essential (primary) hypertension: Secondary | ICD-10-CM | POA: Diagnosis not present

## 2016-08-05 DIAGNOSIS — J441 Chronic obstructive pulmonary disease with (acute) exacerbation: Secondary | ICD-10-CM | POA: Diagnosis not present

## 2016-08-05 DIAGNOSIS — Z7952 Long term (current) use of systemic steroids: Secondary | ICD-10-CM | POA: Diagnosis not present

## 2016-08-05 DIAGNOSIS — F209 Schizophrenia, unspecified: Secondary | ICD-10-CM | POA: Diagnosis not present

## 2016-08-05 DIAGNOSIS — F0390 Unspecified dementia without behavioral disturbance: Secondary | ICD-10-CM | POA: Diagnosis not present

## 2016-08-05 DIAGNOSIS — Z7984 Long term (current) use of oral hypoglycemic drugs: Secondary | ICD-10-CM | POA: Diagnosis not present

## 2016-08-10 DIAGNOSIS — E119 Type 2 diabetes mellitus without complications: Secondary | ICD-10-CM | POA: Diagnosis not present

## 2016-08-10 DIAGNOSIS — Z7984 Long term (current) use of oral hypoglycemic drugs: Secondary | ICD-10-CM | POA: Diagnosis not present

## 2016-08-10 DIAGNOSIS — J441 Chronic obstructive pulmonary disease with (acute) exacerbation: Secondary | ICD-10-CM | POA: Diagnosis not present

## 2016-08-10 DIAGNOSIS — F209 Schizophrenia, unspecified: Secondary | ICD-10-CM | POA: Diagnosis not present

## 2016-08-10 DIAGNOSIS — D72829 Elevated white blood cell count, unspecified: Secondary | ICD-10-CM | POA: Diagnosis not present

## 2016-08-10 DIAGNOSIS — F0390 Unspecified dementia without behavioral disturbance: Secondary | ICD-10-CM | POA: Diagnosis not present

## 2016-08-10 DIAGNOSIS — R0902 Hypoxemia: Secondary | ICD-10-CM | POA: Diagnosis not present

## 2016-08-10 DIAGNOSIS — F319 Bipolar disorder, unspecified: Secondary | ICD-10-CM | POA: Diagnosis not present

## 2016-08-10 DIAGNOSIS — Z7952 Long term (current) use of systemic steroids: Secondary | ICD-10-CM | POA: Diagnosis not present

## 2016-08-10 DIAGNOSIS — J440 Chronic obstructive pulmonary disease with acute lower respiratory infection: Secondary | ICD-10-CM | POA: Diagnosis not present

## 2016-08-10 DIAGNOSIS — I10 Essential (primary) hypertension: Secondary | ICD-10-CM | POA: Diagnosis not present

## 2016-08-10 DIAGNOSIS — Z9981 Dependence on supplemental oxygen: Secondary | ICD-10-CM | POA: Diagnosis not present

## 2016-08-12 DIAGNOSIS — M1711 Unilateral primary osteoarthritis, right knee: Secondary | ICD-10-CM | POA: Diagnosis not present

## 2016-08-17 DIAGNOSIS — F0390 Unspecified dementia without behavioral disturbance: Secondary | ICD-10-CM | POA: Diagnosis not present

## 2016-08-17 DIAGNOSIS — E039 Hypothyroidism, unspecified: Secondary | ICD-10-CM | POA: Diagnosis not present

## 2016-08-17 DIAGNOSIS — G8929 Other chronic pain: Secondary | ICD-10-CM | POA: Diagnosis not present

## 2016-08-17 DIAGNOSIS — N390 Urinary tract infection, site not specified: Secondary | ICD-10-CM | POA: Diagnosis not present

## 2016-08-17 DIAGNOSIS — I10 Essential (primary) hypertension: Secondary | ICD-10-CM | POA: Diagnosis not present

## 2016-08-17 DIAGNOSIS — Z79899 Other long term (current) drug therapy: Secondary | ICD-10-CM | POA: Diagnosis not present

## 2016-08-17 DIAGNOSIS — J449 Chronic obstructive pulmonary disease, unspecified: Secondary | ICD-10-CM | POA: Diagnosis not present

## 2016-08-17 DIAGNOSIS — Z7984 Long term (current) use of oral hypoglycemic drugs: Secondary | ICD-10-CM | POA: Diagnosis not present

## 2016-08-17 DIAGNOSIS — Z87891 Personal history of nicotine dependence: Secondary | ICD-10-CM | POA: Diagnosis not present

## 2016-08-24 DIAGNOSIS — N390 Urinary tract infection, site not specified: Secondary | ICD-10-CM | POA: Diagnosis not present

## 2016-08-24 DIAGNOSIS — Z6827 Body mass index (BMI) 27.0-27.9, adult: Secondary | ICD-10-CM | POA: Diagnosis not present

## 2016-08-24 DIAGNOSIS — E1140 Type 2 diabetes mellitus with diabetic neuropathy, unspecified: Secondary | ICD-10-CM | POA: Diagnosis not present

## 2016-08-24 DIAGNOSIS — M2550 Pain in unspecified joint: Secondary | ICD-10-CM | POA: Diagnosis not present

## 2016-08-31 DIAGNOSIS — Z6827 Body mass index (BMI) 27.0-27.9, adult: Secondary | ICD-10-CM | POA: Diagnosis not present

## 2016-08-31 DIAGNOSIS — N390 Urinary tract infection, site not specified: Secondary | ICD-10-CM | POA: Diagnosis not present

## 2016-08-31 DIAGNOSIS — M2550 Pain in unspecified joint: Secondary | ICD-10-CM | POA: Diagnosis not present

## 2016-09-09 DIAGNOSIS — J440 Chronic obstructive pulmonary disease with acute lower respiratory infection: Secondary | ICD-10-CM | POA: Diagnosis not present

## 2016-09-09 DIAGNOSIS — J441 Chronic obstructive pulmonary disease with (acute) exacerbation: Secondary | ICD-10-CM | POA: Diagnosis not present

## 2016-09-09 DIAGNOSIS — R0902 Hypoxemia: Secondary | ICD-10-CM | POA: Diagnosis not present

## 2016-09-09 DIAGNOSIS — D72829 Elevated white blood cell count, unspecified: Secondary | ICD-10-CM | POA: Diagnosis not present

## 2016-10-01 DIAGNOSIS — R05 Cough: Secondary | ICD-10-CM | POA: Diagnosis not present

## 2016-10-01 DIAGNOSIS — J449 Chronic obstructive pulmonary disease, unspecified: Secondary | ICD-10-CM | POA: Diagnosis not present

## 2016-10-04 ENCOUNTER — Other Ambulatory Visit: Payer: Self-pay | Admitting: Rheumatology

## 2016-10-04 ENCOUNTER — Telehealth: Payer: Self-pay | Admitting: *Deleted

## 2016-10-04 NOTE — Telephone Encounter (Signed)
Last Visit: 07/22/16 Next Visit due July 2018. Message sent to the front to schedule patient.  Okay to refill Folic Acid?

## 2016-10-04 NOTE — Telephone Encounter (Signed)
ok 

## 2016-10-04 NOTE — Telephone Encounter (Signed)
-----   Message from Carley Hammed sent at 10/04/2016  9:15 AM EDT ----- Regarding: RE: Please schedule patient for a follow up visit Attempted to contact the patient to schedule her fu appointment for July.  Patient's voicemail has not been set up yet.  I will try again at another time.  ----- Message ----- From: Henriette Combs, LPN Sent: 01/05/6386   8:44 AM To: Melburn Popper Admin Subject: Please schedule patient for a follow up visit  Please schedule patient for a follow up visit. Patient is due on July 2018. Thanks!

## 2016-10-05 ENCOUNTER — Telehealth: Payer: Self-pay | Admitting: *Deleted

## 2016-10-05 NOTE — Telephone Encounter (Signed)
-----   Message from Carley Hammed sent at 10/05/2016  1:22 PM EDT ----- Regarding: RE: Please schedule patient for a follow up visit Attempted to contact patient to set up her fu appointment.  Her voicemail is not set up yet and I am unable to leave her a message. ----- Message ----- From: Henriette Combs, LPN Sent: 07/09/7562   8:44 AM To: Melburn Popper Admin Subject: Please schedule patient for a follow up visit  Please schedule patient for a follow up visit. Patient is due on July 2018. Thanks!

## 2016-10-10 DIAGNOSIS — J440 Chronic obstructive pulmonary disease with acute lower respiratory infection: Secondary | ICD-10-CM | POA: Diagnosis not present

## 2016-10-10 DIAGNOSIS — D72829 Elevated white blood cell count, unspecified: Secondary | ICD-10-CM | POA: Diagnosis not present

## 2016-10-10 DIAGNOSIS — J441 Chronic obstructive pulmonary disease with (acute) exacerbation: Secondary | ICD-10-CM | POA: Diagnosis not present

## 2016-10-10 DIAGNOSIS — R0902 Hypoxemia: Secondary | ICD-10-CM | POA: Diagnosis not present

## 2016-10-12 DIAGNOSIS — I10 Essential (primary) hypertension: Secondary | ICD-10-CM | POA: Diagnosis not present

## 2016-10-12 DIAGNOSIS — J449 Chronic obstructive pulmonary disease, unspecified: Secondary | ICD-10-CM | POA: Diagnosis not present

## 2016-10-12 DIAGNOSIS — F419 Anxiety disorder, unspecified: Secondary | ICD-10-CM | POA: Diagnosis not present

## 2016-10-12 DIAGNOSIS — R0602 Shortness of breath: Secondary | ICD-10-CM | POA: Diagnosis not present

## 2016-10-12 DIAGNOSIS — J302 Other seasonal allergic rhinitis: Secondary | ICD-10-CM | POA: Diagnosis not present

## 2016-10-12 DIAGNOSIS — F3160 Bipolar disorder, current episode mixed, unspecified: Secondary | ICD-10-CM | POA: Diagnosis not present

## 2016-10-12 DIAGNOSIS — R262 Difficulty in walking, not elsewhere classified: Secondary | ICD-10-CM | POA: Diagnosis not present

## 2016-10-13 DIAGNOSIS — J302 Other seasonal allergic rhinitis: Secondary | ICD-10-CM | POA: Diagnosis not present

## 2016-10-14 DIAGNOSIS — J302 Other seasonal allergic rhinitis: Secondary | ICD-10-CM | POA: Diagnosis not present

## 2016-10-15 DIAGNOSIS — J302 Other seasonal allergic rhinitis: Secondary | ICD-10-CM | POA: Diagnosis not present

## 2016-10-18 DIAGNOSIS — J302 Other seasonal allergic rhinitis: Secondary | ICD-10-CM | POA: Diagnosis not present

## 2016-10-19 DIAGNOSIS — J302 Other seasonal allergic rhinitis: Secondary | ICD-10-CM | POA: Diagnosis not present

## 2016-10-20 DIAGNOSIS — J302 Other seasonal allergic rhinitis: Secondary | ICD-10-CM | POA: Diagnosis not present

## 2016-10-21 DIAGNOSIS — J302 Other seasonal allergic rhinitis: Secondary | ICD-10-CM | POA: Diagnosis not present

## 2016-10-29 DIAGNOSIS — F419 Anxiety disorder, unspecified: Secondary | ICD-10-CM | POA: Diagnosis not present

## 2016-11-18 DIAGNOSIS — E785 Hyperlipidemia, unspecified: Secondary | ICD-10-CM | POA: Diagnosis not present

## 2016-11-18 DIAGNOSIS — E1159 Type 2 diabetes mellitus with other circulatory complications: Secondary | ICD-10-CM | POA: Diagnosis not present

## 2016-11-18 DIAGNOSIS — E1140 Type 2 diabetes mellitus with diabetic neuropathy, unspecified: Secondary | ICD-10-CM | POA: Diagnosis not present

## 2016-12-02 DIAGNOSIS — I10 Essential (primary) hypertension: Secondary | ICD-10-CM | POA: Diagnosis not present

## 2016-12-02 DIAGNOSIS — E1140 Type 2 diabetes mellitus with diabetic neuropathy, unspecified: Secondary | ICD-10-CM | POA: Diagnosis not present

## 2016-12-02 DIAGNOSIS — E1159 Type 2 diabetes mellitus with other circulatory complications: Secondary | ICD-10-CM | POA: Diagnosis not present

## 2016-12-02 DIAGNOSIS — J449 Chronic obstructive pulmonary disease, unspecified: Secondary | ICD-10-CM | POA: Diagnosis not present

## 2017-02-16 DIAGNOSIS — F209 Schizophrenia, unspecified: Secondary | ICD-10-CM | POA: Diagnosis not present

## 2017-03-29 DIAGNOSIS — E039 Hypothyroidism, unspecified: Secondary | ICD-10-CM | POA: Diagnosis not present

## 2017-03-29 DIAGNOSIS — E1159 Type 2 diabetes mellitus with other circulatory complications: Secondary | ICD-10-CM | POA: Diagnosis not present

## 2017-03-29 DIAGNOSIS — E785 Hyperlipidemia, unspecified: Secondary | ICD-10-CM | POA: Diagnosis not present

## 2017-03-29 DIAGNOSIS — E1140 Type 2 diabetes mellitus with diabetic neuropathy, unspecified: Secondary | ICD-10-CM | POA: Diagnosis not present

## 2017-03-30 ENCOUNTER — Other Ambulatory Visit: Payer: Self-pay | Admitting: Rheumatology

## 2017-03-30 NOTE — Telephone Encounter (Signed)
Last Visit: 07/22/16 Next Visit was due July 2018. Message sent to the front to schedule patient  Okay to refill per Dr. Corliss Skains

## 2017-04-05 DIAGNOSIS — E1140 Type 2 diabetes mellitus with diabetic neuropathy, unspecified: Secondary | ICD-10-CM | POA: Diagnosis not present

## 2017-04-05 DIAGNOSIS — E1159 Type 2 diabetes mellitus with other circulatory complications: Secondary | ICD-10-CM | POA: Diagnosis not present

## 2017-04-05 DIAGNOSIS — Z23 Encounter for immunization: Secondary | ICD-10-CM | POA: Diagnosis not present

## 2017-04-05 DIAGNOSIS — E1369 Other specified diabetes mellitus with other specified complication: Secondary | ICD-10-CM | POA: Diagnosis not present

## 2017-04-05 DIAGNOSIS — I10 Essential (primary) hypertension: Secondary | ICD-10-CM | POA: Diagnosis not present

## 2017-04-05 DIAGNOSIS — E785 Hyperlipidemia, unspecified: Secondary | ICD-10-CM | POA: Diagnosis not present

## 2017-05-10 DIAGNOSIS — Z0000 Encounter for general adult medical examination without abnormal findings: Secondary | ICD-10-CM | POA: Diagnosis not present

## 2017-05-10 DIAGNOSIS — Z1331 Encounter for screening for depression: Secondary | ICD-10-CM | POA: Diagnosis not present

## 2017-05-10 DIAGNOSIS — E039 Hypothyroidism, unspecified: Secondary | ICD-10-CM | POA: Diagnosis not present

## 2017-05-10 DIAGNOSIS — Z139 Encounter for screening, unspecified: Secondary | ICD-10-CM | POA: Diagnosis not present

## 2017-05-17 ENCOUNTER — Telehealth: Payer: Self-pay | Admitting: Rheumatology

## 2017-05-17 DIAGNOSIS — E039 Hypothyroidism, unspecified: Secondary | ICD-10-CM | POA: Diagnosis not present

## 2017-05-17 DIAGNOSIS — F3162 Bipolar disorder, current episode mixed, moderate: Secondary | ICD-10-CM | POA: Diagnosis not present

## 2017-05-17 DIAGNOSIS — J449 Chronic obstructive pulmonary disease, unspecified: Secondary | ICD-10-CM | POA: Diagnosis not present

## 2017-05-17 DIAGNOSIS — R262 Difficulty in walking, not elsewhere classified: Secondary | ICD-10-CM | POA: Diagnosis not present

## 2017-05-17 NOTE — Telephone Encounter (Signed)
-----   Message from Henriette Combs, LPN sent at 83/66/2947  8:30 AM EST ----- Regarding: Please schedule patient for a follow up visit Please schedule patient for a follow up visit. Patient was due July 2018. Thanks!

## 2017-05-17 NOTE — Telephone Encounter (Signed)
I tried to contact patient to schedule rov. No answer, and mailbox was not set up.

## 2017-07-05 DIAGNOSIS — E785 Hyperlipidemia, unspecified: Secondary | ICD-10-CM | POA: Diagnosis not present

## 2017-07-05 DIAGNOSIS — E1140 Type 2 diabetes mellitus with diabetic neuropathy, unspecified: Secondary | ICD-10-CM | POA: Diagnosis not present

## 2017-07-05 DIAGNOSIS — E1159 Type 2 diabetes mellitus with other circulatory complications: Secondary | ICD-10-CM | POA: Diagnosis not present

## 2017-07-14 DIAGNOSIS — E1159 Type 2 diabetes mellitus with other circulatory complications: Secondary | ICD-10-CM | POA: Diagnosis not present

## 2017-07-14 DIAGNOSIS — I10 Essential (primary) hypertension: Secondary | ICD-10-CM | POA: Diagnosis not present

## 2017-07-14 DIAGNOSIS — E1140 Type 2 diabetes mellitus with diabetic neuropathy, unspecified: Secondary | ICD-10-CM | POA: Diagnosis not present

## 2017-07-14 DIAGNOSIS — M79604 Pain in right leg: Secondary | ICD-10-CM | POA: Diagnosis not present

## 2017-08-03 DIAGNOSIS — F209 Schizophrenia, unspecified: Secondary | ICD-10-CM | POA: Diagnosis not present

## 2017-08-13 ENCOUNTER — Encounter (HOSPITAL_COMMUNITY): Payer: Self-pay | Admitting: Emergency Medicine

## 2017-08-13 ENCOUNTER — Emergency Department (HOSPITAL_COMMUNITY)
Admission: EM | Admit: 2017-08-13 | Discharge: 2017-08-14 | Disposition: A | Discharge status: Home / Self Care | Payer: Medicare HMO | Attending: Emergency Medicine | Admitting: Emergency Medicine

## 2017-08-13 ENCOUNTER — Other Ambulatory Visit: Payer: Self-pay

## 2017-08-13 DIAGNOSIS — R05 Cough: Secondary | ICD-10-CM | POA: Diagnosis not present

## 2017-08-13 DIAGNOSIS — R0602 Shortness of breath: Secondary | ICD-10-CM | POA: Diagnosis not present

## 2017-08-13 DIAGNOSIS — Z79899 Other long term (current) drug therapy: Secondary | ICD-10-CM | POA: Diagnosis not present

## 2017-08-13 DIAGNOSIS — E119 Type 2 diabetes mellitus without complications: Secondary | ICD-10-CM | POA: Diagnosis not present

## 2017-08-13 DIAGNOSIS — I10 Essential (primary) hypertension: Secondary | ICD-10-CM | POA: Insufficient documentation

## 2017-08-13 DIAGNOSIS — Z87891 Personal history of nicotine dependence: Secondary | ICD-10-CM | POA: Diagnosis not present

## 2017-08-13 DIAGNOSIS — E039 Hypothyroidism, unspecified: Secondary | ICD-10-CM | POA: Diagnosis not present

## 2017-08-13 DIAGNOSIS — Z7984 Long term (current) use of oral hypoglycemic drugs: Secondary | ICD-10-CM | POA: Diagnosis not present

## 2017-08-13 DIAGNOSIS — R000 Tachycardia, unspecified: Secondary | ICD-10-CM | POA: Diagnosis not present

## 2017-08-13 DIAGNOSIS — J441 Chronic obstructive pulmonary disease with (acute) exacerbation: Secondary | ICD-10-CM | POA: Diagnosis not present

## 2017-08-13 NOTE — ED Triage Notes (Signed)
Pt states she has been increasingly short of breath today.  COPD hx.  Coughing up yellow sputum.  Diminished but clear lung sounds bilaterally. No home O2 requirement. O2 sats stable in triage.

## 2017-08-14 ENCOUNTER — Emergency Department (HOSPITAL_COMMUNITY): Payer: Medicare HMO

## 2017-08-14 ENCOUNTER — Other Ambulatory Visit: Payer: Self-pay

## 2017-08-14 DIAGNOSIS — R0602 Shortness of breath: Secondary | ICD-10-CM | POA: Diagnosis not present

## 2017-08-14 DIAGNOSIS — R05 Cough: Secondary | ICD-10-CM | POA: Diagnosis not present

## 2017-08-14 LAB — CBC
HCT: 36.8 % (ref 36.0–46.0)
Hemoglobin: 12.2 g/dL (ref 12.0–15.0)
MCH: 29.9 pg (ref 26.0–34.0)
MCHC: 33.2 g/dL (ref 30.0–36.0)
MCV: 90.2 fL (ref 78.0–100.0)
PLATELETS: 244 10*3/uL (ref 150–400)
RBC: 4.08 MIL/uL (ref 3.87–5.11)
RDW: 16.7 % — ABNORMAL HIGH (ref 11.5–15.5)
WBC: 6.1 10*3/uL (ref 4.0–10.5)

## 2017-08-14 LAB — BASIC METABOLIC PANEL
Anion gap: 8 (ref 5–15)
BUN: 9 mg/dL (ref 6–20)
CALCIUM: 8.9 mg/dL (ref 8.9–10.3)
CHLORIDE: 103 mmol/L (ref 101–111)
CO2: 22 mmol/L (ref 22–32)
CREATININE: 0.69 mg/dL (ref 0.44–1.00)
GFR calc Af Amer: >60 mL/min (ref >60)
GFR calc non Af Amer: >60 mL/min (ref >60)
GLUCOSE: 107 mg/dL — AB (ref 65–99)
Potassium: 3.9 mmol/L (ref 3.5–5.1)
Sodium: 133 mmol/L — ABNORMAL LOW (ref 135–145)

## 2017-08-14 LAB — I-STAT TROPONIN, ED: TROPONIN I, POC: 0 ng/mL (ref 0.00–0.08)

## 2017-08-14 MED ORDER — AZITHROMYCIN 250 MG PO TABS
500.0000 mg | ORAL_TABLET | Freq: Once | ORAL | Status: AC
  Administered 2017-08-14: 500 mg via ORAL
  Filled 2017-08-14: qty 2

## 2017-08-14 MED ORDER — DOXYCYCLINE HYCLATE 100 MG PO CAPS: 100.0000 mg | ORAL_CAPSULE | Freq: Two times a day (BID) | ORAL | 0 refills | Status: DC

## 2017-08-14 MED ORDER — IPRATROPIUM-ALBUTEROL 0.5-2.5 (3) MG/3ML IN SOLN
3.0000 mL | Freq: Once | RESPIRATORY_TRACT | Status: AC
  Administered 2017-08-14: 3 mL via RESPIRATORY_TRACT
  Filled 2017-08-14: qty 3

## 2017-08-14 MED ORDER — ALBUTEROL SULFATE (2.5 MG/3ML) 0.083% IN NEBU
5.0000 mg | INHALATION_SOLUTION | Freq: Once | RESPIRATORY_TRACT | Status: AC
  Administered 2017-08-14: 5 mg via RESPIRATORY_TRACT
  Filled 2017-08-14: qty 6

## 2017-08-14 NOTE — ED Provider Notes (Signed)
[facility] EMERGENCY DEPARTMENT Provider Note   CSN: [id number] Arrival date & time: 08/13/17  2344     History   Chief Complaint Chief Complaint  Patient presents with  . Shortness of Breath    HPI Karla Clark is a 72 y.o. female.  Patient with past medical history remarkable for COPD, diabetes, hypertension, hypothyroid, and schizophrenia presents to the emergency department with chief complaint of cough and chest congestion with wheezing.  She is accompanied by family member, who states that she has been wheezing and has been taking nebulizer treatments at home.  Family member reports that the patient states that she wanted to come to the hospital tonight.  Denies any fever or chills.  She has had cough with yellow sputum.  She was given a breathing treatment in triage.  The history is provided by the patient. No language interpreter was used.    Past Medical History:  Diagnosis Date  . Anxiety   . COPD (chronic obstructive pulmonary disease) ([facility])   . Diabetes mellitus without complication ([facility])   . Elevated cholesterol   . Hypertension   . Hypothyroidism   . Osteoarthritis   . RA (rheumatoid arthritis) ([facility])   . Schizophrenia [facility])    delusional    Patient Active Problem List   Diagnosis Date Noted  . Rheumatoid arthritis with rheumatoid factor of multiple sites without organ or systems involvement ([facility]) 07/19/2016  . High risk medications (not anticoagulants) long-term use 07/19/2016  . Osteoarthritis of both knees 07/19/2016  . Osteoarthritis of both hands 07/19/2016  . Primary osteoarthritis of both feet 07/19/2016    Past Surgical History:  Procedure Laterality Date  . ABDOMINAL HYSTERECTOMY       OB History   None      Home Medications    Prior to Admission medications   Medication Sig Start Date End Date Taking? Authorizing Provider  budesonide (PULMICORT) 0.5 MG/2ML nebulizer solution INHALE CONTENTS OF 1 VIAL VIA  NEBULIZER BID 06/12/16   [provider]  clotrimazole-betamethasone (LOTRISONE) cream APP CREAM TO VULAR AREA BID PRF REDNESS AND ITCHING 01/13/16   [provider]  fluPHENAZine (PROLIXIN) 1 MG tablet TK 1 T PO BID 02/19/16   [provider]  folic acid (FOLVITE) 1 MG tablet TAKE 1 TABLET BY MOUTH EVERY DAY 03/30/17   Pollyann Savoy, MD  hydrOXYzine (ATARAX/VISTARIL) 25 MG tablet TK 1/2 T PO BID PRN 01/04/16   [provider]  levothyroxine (SYNTHROID, LEVOTHROID) 88 MCG tablet TK 1 T PO D 02/27/16   [provider]  metFORMIN (GLUCOPHAGE-XR) 500 MG 24 hr tablet TK 2 TS PO D 07/05/16   [provider]  nystatin cream (MYCOSTATIN) APP EXT AA BID FOR 7 TO 10 DAYS 02/05/16   [provider]  NYSTATIN powder APPLY POWDER TWICE DAILY AS NEEDED 02/05/16   [provider]  omeprazole (PRILOSEC) 20 MG capsule TK 1 C PO D 01/27/16   [provider]  pioglitazone (ACTOS) 30 MG tablet TK 1 T PO D 01/27/16   [provider]  simvastatin (ZOCOR) 40 MG tablet TK 1 T PO HS 01/12/16   [provider]  valsartan (DIOVAN) 160 MG tablet TK 1 T PO QD 01/07/16   [provider]  venlafaxine XR (EFFEXOR-XR) 75 MG 24 hr capsule TK 1 C PO QAM 02/19/16   [provider]    Family History No family history on file.  Social History Social History  Tobacco Use  . Smoking status: Former Smoker    Last attempt to quit: 03/11/1986    Years since quitting: 31.4  . Smokeless tobacco: Never Used  Substance Use Topics  . Alcohol use: No  . Drug use: No     Allergies   Ace inhibitors   Review of Systems Review of Systems  All other systems reviewed and are negative.    Physical Exam Updated Vital Signs BP (!) 119/40 (BP Location: Right Arm)   Pulse (!) 112   Temp (!) 97.5 F (36.4 C) (Oral)   Resp 14   Ht 5\' 2"  (1.575 m)   Wt 77.1 kg (170 lb)   SpO2 100%   BMI 31.09 kg/m   Physical Exam    Constitutional: She is oriented to person, place, and time. She appears well-developed and well-nourished.  HENT:  Head: Normocephalic and atraumatic.  Eyes: Pupils are equal, round, and reactive to light. Conjunctivae and EOM are normal.  Neck: Normal range of motion. Neck supple.  Cardiovascular: Normal rate and regular rhythm. Exam reveals no gallop and no friction rub.  No murmur heard. Pulmonary/Chest: Effort normal. No respiratory distress. She has wheezes (Right-sided). She has no rales. She exhibits no tenderness.  Abdominal: Soft. Bowel sounds are normal. She exhibits no distension and no mass. There is no tenderness. There is no rebound and no guarding.  Musculoskeletal: Normal range of motion. She exhibits no edema or tenderness.  Neurological: She is alert and oriented to person, place, and time.  Skin: Skin is warm and dry.  Psychiatric: She has a normal mood and affect. Her behavior is normal. Judgment and thought content normal.  Nursing note and vitals reviewed.    ED Treatments / Results  Labs (all labs ordered are listed, but only abnormal results are displayed) Labs Reviewed  BASIC METABOLIC PANEL - Abnormal; Notable for the following components:      Result Value   Sodium 133 (*)    Glucose, Bld 107 (*)    All other components within normal limits  CBC - Abnormal; Notable for the following components:   RDW 16.7 (*)    All other components within normal limits  I-STAT TROPONIN, ED    EKG None  Radiology Dg Chest 2 View  Result Date: 08/14/2017 CLINICAL DATA:  Shortness of breath, cough. On home oxygen. History of COPD. EXAM: CHEST - 2 VIEW COMPARISON:  Chest radiograph June 12, 2016 FINDINGS: Cardiomediastinal silhouette is normal. No pleural effusions or focal consolidations. Hyperinflation. Trachea projects midline and there is no pneumothorax. Soft tissue planes and included osseous structures are non-suspicious. Lower thoracic dextroscoliosis.  IMPRESSION: Hyperinflation without focal consolidation. Electronically Signed   By: June 14, 2016 M.D.   On: 08/14/2017 01:32    Procedures Procedures (including critical care time)  Medications Ordered in ED Medications  ipratropium-albuterol (DUONEB) 0.5-2.5 (3) MG/3ML nebulizer solution 3 mL (has no administration in time range)  azithromycin (ZITHROMAX) tablet 500 mg (has no administration in time range)  albuterol (PROVENTIL) (2.5 MG/3ML) 0.083% nebulizer solution 5 mg (5 mg Nebulization Given 08/14/17 0017)     Initial Impression / Assessment and Plan / ED Course  I have reviewed the triage vital signs and the nursing notes.  Pertinent labs & imaging results that were available during my care of the patient were reviewed by me and considered in my medical decision making (see chart for details).     Patient was COPD.  She has wheezing in her right  lung fields.  Will give breathing treatment.  She is afebrile.  No leukocytosis.  She is nontoxic appearing.  She is 100% on room air and is not tachypneic.  Anticipate discharge to home with azithromycin.  Wheezing has resolved.  Seen by and discussed with Dr. Manus Gunning, who recommends home with doxy.    Final Clinical Impressions(s) / ED Diagnoses   Final diagnoses:  COPD exacerbation [facility])    ED Discharge Orders        Ordered    doxycycline (VIBRAMYCIN) 100 MG capsule  2 times daily     08/14/17 0439       Roxy Horseman, PA-C 08/14/17 0440    Glynn Octave, MD 08/14/17 [phone]

## 2017-10-14 DIAGNOSIS — E119 Type 2 diabetes mellitus without complications: Secondary | ICD-10-CM | POA: Diagnosis not present

## 2017-10-14 DIAGNOSIS — E789 Disorder of lipoprotein metabolism, unspecified: Secondary | ICD-10-CM | POA: Diagnosis not present

## 2017-10-17 DIAGNOSIS — G629 Polyneuropathy, unspecified: Secondary | ICD-10-CM | POA: Diagnosis not present

## 2017-10-17 DIAGNOSIS — R4182 Altered mental status, unspecified: Secondary | ICD-10-CM | POA: Diagnosis not present

## 2017-10-17 DIAGNOSIS — J441 Chronic obstructive pulmonary disease with (acute) exacerbation: Secondary | ICD-10-CM | POA: Diagnosis not present

## 2017-10-17 DIAGNOSIS — R05 Cough: Secondary | ICD-10-CM | POA: Diagnosis not present

## 2017-10-17 DIAGNOSIS — E039 Hypothyroidism, unspecified: Secondary | ICD-10-CM | POA: Diagnosis not present

## 2017-10-17 DIAGNOSIS — J189 Pneumonia, unspecified organism: Secondary | ICD-10-CM | POA: Diagnosis not present

## 2017-10-17 DIAGNOSIS — J181 Lobar pneumonia, unspecified organism: Secondary | ICD-10-CM | POA: Diagnosis not present

## 2017-10-17 DIAGNOSIS — D72825 Bandemia: Secondary | ICD-10-CM | POA: Diagnosis not present

## 2017-10-17 DIAGNOSIS — K219 Gastro-esophageal reflux disease without esophagitis: Secondary | ICD-10-CM | POA: Diagnosis not present

## 2017-10-17 DIAGNOSIS — M2550 Pain in unspecified joint: Secondary | ICD-10-CM | POA: Diagnosis not present

## 2017-10-17 DIAGNOSIS — M6281 Muscle weakness (generalized): Secondary | ICD-10-CM | POA: Diagnosis not present

## 2017-10-17 DIAGNOSIS — Z7401 Bed confinement status: Secondary | ICD-10-CM | POA: Diagnosis not present

## 2017-10-17 DIAGNOSIS — R531 Weakness: Secondary | ICD-10-CM | POA: Diagnosis not present

## 2017-10-17 DIAGNOSIS — E876 Hypokalemia: Secondary | ICD-10-CM | POA: Diagnosis not present

## 2017-10-17 DIAGNOSIS — E1142 Type 2 diabetes mellitus with diabetic polyneuropathy: Secondary | ICD-10-CM | POA: Diagnosis not present

## 2017-10-17 DIAGNOSIS — R41841 Cognitive communication deficit: Secondary | ICD-10-CM | POA: Diagnosis not present

## 2017-10-17 DIAGNOSIS — I10 Essential (primary) hypertension: Secondary | ICD-10-CM | POA: Diagnosis not present

## 2017-10-17 DIAGNOSIS — J440 Chronic obstructive pulmonary disease with acute lower respiratory infection: Secondary | ICD-10-CM | POA: Diagnosis not present

## 2017-10-17 DIAGNOSIS — E119 Type 2 diabetes mellitus without complications: Secondary | ICD-10-CM | POA: Diagnosis not present

## 2017-10-17 DIAGNOSIS — E1140 Type 2 diabetes mellitus with diabetic neuropathy, unspecified: Secondary | ICD-10-CM | POA: Diagnosis not present

## 2017-10-17 DIAGNOSIS — F338 Other recurrent depressive disorders: Secondary | ICD-10-CM | POA: Diagnosis not present

## 2017-10-17 DIAGNOSIS — R2681 Unsteadiness on feet: Secondary | ICD-10-CM | POA: Diagnosis not present

## 2017-10-17 DIAGNOSIS — E785 Hyperlipidemia, unspecified: Secondary | ICD-10-CM | POA: Diagnosis not present

## 2017-10-17 DIAGNOSIS — A419 Sepsis, unspecified organism: Secondary | ICD-10-CM | POA: Diagnosis not present

## 2017-10-18 DIAGNOSIS — G629 Polyneuropathy, unspecified: Secondary | ICD-10-CM | POA: Diagnosis not present

## 2017-10-18 DIAGNOSIS — E119 Type 2 diabetes mellitus without complications: Secondary | ICD-10-CM | POA: Diagnosis not present

## 2017-10-18 DIAGNOSIS — A419 Sepsis, unspecified organism: Secondary | ICD-10-CM | POA: Diagnosis not present

## 2017-10-18 DIAGNOSIS — E039 Hypothyroidism, unspecified: Secondary | ICD-10-CM | POA: Diagnosis not present

## 2017-10-19 DIAGNOSIS — E119 Type 2 diabetes mellitus without complications: Secondary | ICD-10-CM | POA: Diagnosis not present

## 2017-10-19 DIAGNOSIS — G629 Polyneuropathy, unspecified: Secondary | ICD-10-CM | POA: Diagnosis not present

## 2017-10-19 DIAGNOSIS — A419 Sepsis, unspecified organism: Secondary | ICD-10-CM | POA: Diagnosis not present

## 2017-10-19 DIAGNOSIS — E039 Hypothyroidism, unspecified: Secondary | ICD-10-CM | POA: Diagnosis not present

## 2017-10-20 DIAGNOSIS — G629 Polyneuropathy, unspecified: Secondary | ICD-10-CM | POA: Diagnosis not present

## 2017-10-20 DIAGNOSIS — R41841 Cognitive communication deficit: Secondary | ICD-10-CM | POA: Diagnosis not present

## 2017-10-20 DIAGNOSIS — E1140 Type 2 diabetes mellitus with diabetic neuropathy, unspecified: Secondary | ICD-10-CM | POA: Diagnosis not present

## 2017-10-20 DIAGNOSIS — D72825 Bandemia: Secondary | ICD-10-CM | POA: Diagnosis not present

## 2017-10-20 DIAGNOSIS — F338 Other recurrent depressive disorders: Secondary | ICD-10-CM | POA: Diagnosis not present

## 2017-10-20 DIAGNOSIS — I10 Essential (primary) hypertension: Secondary | ICD-10-CM | POA: Diagnosis not present

## 2017-10-20 DIAGNOSIS — J441 Chronic obstructive pulmonary disease with (acute) exacerbation: Secondary | ICD-10-CM | POA: Diagnosis not present

## 2017-10-20 DIAGNOSIS — E119 Type 2 diabetes mellitus without complications: Secondary | ICD-10-CM | POA: Diagnosis not present

## 2017-10-20 DIAGNOSIS — M6281 Muscle weakness (generalized): Secondary | ICD-10-CM | POA: Diagnosis not present

## 2017-10-20 DIAGNOSIS — E876 Hypokalemia: Secondary | ICD-10-CM

## 2017-10-20 DIAGNOSIS — A419 Sepsis, unspecified organism: Secondary | ICD-10-CM | POA: Diagnosis not present

## 2017-10-20 DIAGNOSIS — J189 Pneumonia, unspecified organism: Secondary | ICD-10-CM | POA: Diagnosis not present

## 2017-10-20 DIAGNOSIS — R2681 Unsteadiness on feet: Secondary | ICD-10-CM | POA: Diagnosis not present

## 2017-10-20 DIAGNOSIS — E039 Hypothyroidism, unspecified: Secondary | ICD-10-CM | POA: Diagnosis not present

## 2017-10-20 DIAGNOSIS — Z7401 Bed confinement status: Secondary | ICD-10-CM | POA: Diagnosis not present

## 2017-10-20 DIAGNOSIS — M2550 Pain in unspecified joint: Secondary | ICD-10-CM | POA: Diagnosis not present

## 2017-10-31 DIAGNOSIS — J441 Chronic obstructive pulmonary disease with (acute) exacerbation: Secondary | ICD-10-CM | POA: Diagnosis not present

## 2017-10-31 DIAGNOSIS — E039 Hypothyroidism, unspecified: Secondary | ICD-10-CM | POA: Diagnosis not present

## 2017-10-31 DIAGNOSIS — I10 Essential (primary) hypertension: Secondary | ICD-10-CM | POA: Diagnosis not present

## 2017-10-31 DIAGNOSIS — F319 Bipolar disorder, unspecified: Secondary | ICD-10-CM | POA: Diagnosis not present

## 2017-10-31 DIAGNOSIS — F209 Schizophrenia, unspecified: Secondary | ICD-10-CM | POA: Diagnosis not present

## 2017-10-31 DIAGNOSIS — J189 Pneumonia, unspecified organism: Secondary | ICD-10-CM | POA: Diagnosis not present

## 2017-10-31 DIAGNOSIS — M1991 Primary osteoarthritis, unspecified site: Secondary | ICD-10-CM | POA: Diagnosis not present

## 2017-10-31 DIAGNOSIS — E1142 Type 2 diabetes mellitus with diabetic polyneuropathy: Secondary | ICD-10-CM | POA: Diagnosis not present

## 2017-10-31 DIAGNOSIS — F0390 Unspecified dementia without behavioral disturbance: Secondary | ICD-10-CM | POA: Diagnosis not present

## 2017-11-01 ENCOUNTER — Other Ambulatory Visit: Payer: Self-pay

## 2017-11-01 DIAGNOSIS — I10 Essential (primary) hypertension: Secondary | ICD-10-CM | POA: Diagnosis not present

## 2017-11-01 DIAGNOSIS — E11610 Type 2 diabetes mellitus with diabetic neuropathic arthropathy: Secondary | ICD-10-CM | POA: Diagnosis not present

## 2017-11-01 DIAGNOSIS — E1159 Type 2 diabetes mellitus with other circulatory complications: Secondary | ICD-10-CM | POA: Diagnosis not present

## 2017-11-01 DIAGNOSIS — E1140 Type 2 diabetes mellitus with diabetic neuropathy, unspecified: Secondary | ICD-10-CM | POA: Diagnosis not present

## 2017-11-01 NOTE — Patient Outreach (Signed)
[facility]) Care Management  11/01/2017  Karla Clark 1945/06/06 [id number]  Transition of care  Referral date: 11/01/17 Referral source: discharged from [facility] [place] 10/30/17 Insurance: [employer]  Transition of care will be completed by primary care provider office and will refer to [facility] care management as needed. Marland Kitchen    PLAN: RNCM will close patient due to patient being enrolled in an external program.  [name] RN,BSN,CCM [facility] Telephonic  [phone]

## 2017-11-03 DIAGNOSIS — J441 Chronic obstructive pulmonary disease with (acute) exacerbation: Secondary | ICD-10-CM | POA: Diagnosis not present

## 2017-11-03 DIAGNOSIS — F0390 Unspecified dementia without behavioral disturbance: Secondary | ICD-10-CM | POA: Diagnosis not present

## 2017-11-03 DIAGNOSIS — M1991 Primary osteoarthritis, unspecified site: Secondary | ICD-10-CM | POA: Diagnosis not present

## 2017-11-03 DIAGNOSIS — I10 Essential (primary) hypertension: Secondary | ICD-10-CM | POA: Diagnosis not present

## 2017-11-03 DIAGNOSIS — J189 Pneumonia, unspecified organism: Secondary | ICD-10-CM | POA: Diagnosis not present

## 2017-11-03 DIAGNOSIS — F319 Bipolar disorder, unspecified: Secondary | ICD-10-CM | POA: Diagnosis not present

## 2017-11-03 DIAGNOSIS — E039 Hypothyroidism, unspecified: Secondary | ICD-10-CM | POA: Diagnosis not present

## 2017-11-03 DIAGNOSIS — E1142 Type 2 diabetes mellitus with diabetic polyneuropathy: Secondary | ICD-10-CM | POA: Diagnosis not present

## 2017-11-03 DIAGNOSIS — F209 Schizophrenia, unspecified: Secondary | ICD-10-CM | POA: Diagnosis not present

## 2017-11-07 DIAGNOSIS — F0390 Unspecified dementia without behavioral disturbance: Secondary | ICD-10-CM | POA: Diagnosis not present

## 2017-11-07 DIAGNOSIS — E1142 Type 2 diabetes mellitus with diabetic polyneuropathy: Secondary | ICD-10-CM | POA: Diagnosis not present

## 2017-11-07 DIAGNOSIS — M1991 Primary osteoarthritis, unspecified site: Secondary | ICD-10-CM | POA: Diagnosis not present

## 2017-11-07 DIAGNOSIS — J189 Pneumonia, unspecified organism: Secondary | ICD-10-CM | POA: Diagnosis not present

## 2017-11-07 DIAGNOSIS — I10 Essential (primary) hypertension: Secondary | ICD-10-CM | POA: Diagnosis not present

## 2017-11-07 DIAGNOSIS — F209 Schizophrenia, unspecified: Secondary | ICD-10-CM | POA: Diagnosis not present

## 2017-11-07 DIAGNOSIS — F319 Bipolar disorder, unspecified: Secondary | ICD-10-CM | POA: Diagnosis not present

## 2017-11-07 DIAGNOSIS — J441 Chronic obstructive pulmonary disease with (acute) exacerbation: Secondary | ICD-10-CM | POA: Diagnosis not present

## 2017-11-07 DIAGNOSIS — E039 Hypothyroidism, unspecified: Secondary | ICD-10-CM | POA: Diagnosis not present

## 2017-11-09 DIAGNOSIS — I10 Essential (primary) hypertension: Secondary | ICD-10-CM | POA: Diagnosis not present

## 2017-11-09 DIAGNOSIS — F209 Schizophrenia, unspecified: Secondary | ICD-10-CM | POA: Diagnosis not present

## 2017-11-09 DIAGNOSIS — F319 Bipolar disorder, unspecified: Secondary | ICD-10-CM | POA: Diagnosis not present

## 2017-11-09 DIAGNOSIS — E039 Hypothyroidism, unspecified: Secondary | ICD-10-CM | POA: Diagnosis not present

## 2017-11-09 DIAGNOSIS — J441 Chronic obstructive pulmonary disease with (acute) exacerbation: Secondary | ICD-10-CM | POA: Diagnosis not present

## 2017-11-09 DIAGNOSIS — J189 Pneumonia, unspecified organism: Secondary | ICD-10-CM | POA: Diagnosis not present

## 2017-11-09 DIAGNOSIS — M1991 Primary osteoarthritis, unspecified site: Secondary | ICD-10-CM | POA: Diagnosis not present

## 2017-11-09 DIAGNOSIS — E1142 Type 2 diabetes mellitus with diabetic polyneuropathy: Secondary | ICD-10-CM | POA: Diagnosis not present

## 2017-11-09 DIAGNOSIS — F0390 Unspecified dementia without behavioral disturbance: Secondary | ICD-10-CM | POA: Diagnosis not present

## 2017-11-14 DIAGNOSIS — F209 Schizophrenia, unspecified: Secondary | ICD-10-CM | POA: Diagnosis not present

## 2017-11-14 DIAGNOSIS — F319 Bipolar disorder, unspecified: Secondary | ICD-10-CM | POA: Diagnosis not present

## 2017-11-14 DIAGNOSIS — J441 Chronic obstructive pulmonary disease with (acute) exacerbation: Secondary | ICD-10-CM | POA: Diagnosis not present

## 2017-11-14 DIAGNOSIS — F0390 Unspecified dementia without behavioral disturbance: Secondary | ICD-10-CM | POA: Diagnosis not present

## 2017-11-14 DIAGNOSIS — J189 Pneumonia, unspecified organism: Secondary | ICD-10-CM | POA: Diagnosis not present

## 2017-11-14 DIAGNOSIS — M1991 Primary osteoarthritis, unspecified site: Secondary | ICD-10-CM | POA: Diagnosis not present

## 2017-11-14 DIAGNOSIS — I10 Essential (primary) hypertension: Secondary | ICD-10-CM | POA: Diagnosis not present

## 2017-11-14 DIAGNOSIS — E1142 Type 2 diabetes mellitus with diabetic polyneuropathy: Secondary | ICD-10-CM | POA: Diagnosis not present

## 2017-11-14 DIAGNOSIS — E039 Hypothyroidism, unspecified: Secondary | ICD-10-CM | POA: Diagnosis not present

## 2017-11-16 DIAGNOSIS — J441 Chronic obstructive pulmonary disease with (acute) exacerbation: Secondary | ICD-10-CM | POA: Diagnosis not present

## 2017-11-16 DIAGNOSIS — I10 Essential (primary) hypertension: Secondary | ICD-10-CM | POA: Diagnosis not present

## 2017-11-16 DIAGNOSIS — E039 Hypothyroidism, unspecified: Secondary | ICD-10-CM | POA: Diagnosis not present

## 2017-11-16 DIAGNOSIS — F319 Bipolar disorder, unspecified: Secondary | ICD-10-CM | POA: Diagnosis not present

## 2017-11-16 DIAGNOSIS — M1991 Primary osteoarthritis, unspecified site: Secondary | ICD-10-CM | POA: Diagnosis not present

## 2017-11-16 DIAGNOSIS — F209 Schizophrenia, unspecified: Secondary | ICD-10-CM | POA: Diagnosis not present

## 2017-11-16 DIAGNOSIS — J189 Pneumonia, unspecified organism: Secondary | ICD-10-CM | POA: Diagnosis not present

## 2017-11-16 DIAGNOSIS — E1142 Type 2 diabetes mellitus with diabetic polyneuropathy: Secondary | ICD-10-CM | POA: Diagnosis not present

## 2017-11-16 DIAGNOSIS — F0390 Unspecified dementia without behavioral disturbance: Secondary | ICD-10-CM | POA: Diagnosis not present

## 2017-11-18 DIAGNOSIS — F0390 Unspecified dementia without behavioral disturbance: Secondary | ICD-10-CM | POA: Diagnosis not present

## 2017-11-18 DIAGNOSIS — E039 Hypothyroidism, unspecified: Secondary | ICD-10-CM | POA: Diagnosis not present

## 2017-11-18 DIAGNOSIS — E1142 Type 2 diabetes mellitus with diabetic polyneuropathy: Secondary | ICD-10-CM | POA: Diagnosis not present

## 2017-11-18 DIAGNOSIS — J189 Pneumonia, unspecified organism: Secondary | ICD-10-CM | POA: Diagnosis not present

## 2017-11-18 DIAGNOSIS — I10 Essential (primary) hypertension: Secondary | ICD-10-CM | POA: Diagnosis not present

## 2017-11-18 DIAGNOSIS — F209 Schizophrenia, unspecified: Secondary | ICD-10-CM | POA: Diagnosis not present

## 2017-11-18 DIAGNOSIS — J441 Chronic obstructive pulmonary disease with (acute) exacerbation: Secondary | ICD-10-CM | POA: Diagnosis not present

## 2017-11-18 DIAGNOSIS — F319 Bipolar disorder, unspecified: Secondary | ICD-10-CM | POA: Diagnosis not present

## 2017-11-18 DIAGNOSIS — M1991 Primary osteoarthritis, unspecified site: Secondary | ICD-10-CM | POA: Diagnosis not present

## 2017-11-23 DIAGNOSIS — J189 Pneumonia, unspecified organism: Secondary | ICD-10-CM | POA: Diagnosis not present

## 2017-11-23 DIAGNOSIS — F0390 Unspecified dementia without behavioral disturbance: Secondary | ICD-10-CM | POA: Diagnosis not present

## 2017-11-23 DIAGNOSIS — J441 Chronic obstructive pulmonary disease with (acute) exacerbation: Secondary | ICD-10-CM | POA: Diagnosis not present

## 2017-11-23 DIAGNOSIS — F319 Bipolar disorder, unspecified: Secondary | ICD-10-CM | POA: Diagnosis not present

## 2017-11-23 DIAGNOSIS — E1142 Type 2 diabetes mellitus with diabetic polyneuropathy: Secondary | ICD-10-CM | POA: Diagnosis not present

## 2017-11-23 DIAGNOSIS — F209 Schizophrenia, unspecified: Secondary | ICD-10-CM | POA: Diagnosis not present

## 2017-11-23 DIAGNOSIS — E039 Hypothyroidism, unspecified: Secondary | ICD-10-CM | POA: Diagnosis not present

## 2017-11-23 DIAGNOSIS — I10 Essential (primary) hypertension: Secondary | ICD-10-CM | POA: Diagnosis not present

## 2017-11-23 DIAGNOSIS — M1991 Primary osteoarthritis, unspecified site: Secondary | ICD-10-CM | POA: Diagnosis not present

## 2017-11-24 DIAGNOSIS — F209 Schizophrenia, unspecified: Secondary | ICD-10-CM | POA: Diagnosis not present

## 2017-11-24 DIAGNOSIS — E039 Hypothyroidism, unspecified: Secondary | ICD-10-CM | POA: Diagnosis not present

## 2017-11-24 DIAGNOSIS — I10 Essential (primary) hypertension: Secondary | ICD-10-CM | POA: Diagnosis not present

## 2017-11-24 DIAGNOSIS — M1991 Primary osteoarthritis, unspecified site: Secondary | ICD-10-CM | POA: Diagnosis not present

## 2017-11-24 DIAGNOSIS — E1142 Type 2 diabetes mellitus with diabetic polyneuropathy: Secondary | ICD-10-CM | POA: Diagnosis not present

## 2017-11-24 DIAGNOSIS — J441 Chronic obstructive pulmonary disease with (acute) exacerbation: Secondary | ICD-10-CM | POA: Diagnosis not present

## 2017-11-24 DIAGNOSIS — F0390 Unspecified dementia without behavioral disturbance: Secondary | ICD-10-CM | POA: Diagnosis not present

## 2017-11-24 DIAGNOSIS — F319 Bipolar disorder, unspecified: Secondary | ICD-10-CM | POA: Diagnosis not present

## 2017-11-24 DIAGNOSIS — J189 Pneumonia, unspecified organism: Secondary | ICD-10-CM | POA: Diagnosis not present

## 2017-11-25 DIAGNOSIS — F209 Schizophrenia, unspecified: Secondary | ICD-10-CM | POA: Diagnosis not present

## 2017-11-25 DIAGNOSIS — M1991 Primary osteoarthritis, unspecified site: Secondary | ICD-10-CM | POA: Diagnosis not present

## 2017-11-25 DIAGNOSIS — J441 Chronic obstructive pulmonary disease with (acute) exacerbation: Secondary | ICD-10-CM | POA: Diagnosis not present

## 2017-11-25 DIAGNOSIS — E1142 Type 2 diabetes mellitus with diabetic polyneuropathy: Secondary | ICD-10-CM | POA: Diagnosis not present

## 2017-11-25 DIAGNOSIS — F319 Bipolar disorder, unspecified: Secondary | ICD-10-CM | POA: Diagnosis not present

## 2017-11-25 DIAGNOSIS — J189 Pneumonia, unspecified organism: Secondary | ICD-10-CM | POA: Diagnosis not present

## 2017-11-25 DIAGNOSIS — E039 Hypothyroidism, unspecified: Secondary | ICD-10-CM | POA: Diagnosis not present

## 2017-11-25 DIAGNOSIS — I10 Essential (primary) hypertension: Secondary | ICD-10-CM | POA: Diagnosis not present

## 2017-11-25 DIAGNOSIS — F0390 Unspecified dementia without behavioral disturbance: Secondary | ICD-10-CM | POA: Diagnosis not present

## 2017-11-29 DIAGNOSIS — F319 Bipolar disorder, unspecified: Secondary | ICD-10-CM | POA: Diagnosis not present

## 2017-11-29 DIAGNOSIS — J441 Chronic obstructive pulmonary disease with (acute) exacerbation: Secondary | ICD-10-CM | POA: Diagnosis not present

## 2017-11-29 DIAGNOSIS — E1142 Type 2 diabetes mellitus with diabetic polyneuropathy: Secondary | ICD-10-CM | POA: Diagnosis not present

## 2017-11-29 DIAGNOSIS — J189 Pneumonia, unspecified organism: Secondary | ICD-10-CM | POA: Diagnosis not present

## 2017-11-29 DIAGNOSIS — I10 Essential (primary) hypertension: Secondary | ICD-10-CM | POA: Diagnosis not present

## 2017-11-29 DIAGNOSIS — F0390 Unspecified dementia without behavioral disturbance: Secondary | ICD-10-CM | POA: Diagnosis not present

## 2017-11-29 DIAGNOSIS — M1991 Primary osteoarthritis, unspecified site: Secondary | ICD-10-CM | POA: Diagnosis not present

## 2017-11-29 DIAGNOSIS — E039 Hypothyroidism, unspecified: Secondary | ICD-10-CM | POA: Diagnosis not present

## 2017-11-29 DIAGNOSIS — F209 Schizophrenia, unspecified: Secondary | ICD-10-CM | POA: Diagnosis not present

## 2017-11-30 DIAGNOSIS — E1142 Type 2 diabetes mellitus with diabetic polyneuropathy: Secondary | ICD-10-CM | POA: Diagnosis not present

## 2017-11-30 DIAGNOSIS — E039 Hypothyroidism, unspecified: Secondary | ICD-10-CM | POA: Diagnosis not present

## 2017-11-30 DIAGNOSIS — J441 Chronic obstructive pulmonary disease with (acute) exacerbation: Secondary | ICD-10-CM | POA: Diagnosis not present

## 2017-11-30 DIAGNOSIS — F319 Bipolar disorder, unspecified: Secondary | ICD-10-CM | POA: Diagnosis not present

## 2017-11-30 DIAGNOSIS — I10 Essential (primary) hypertension: Secondary | ICD-10-CM | POA: Diagnosis not present

## 2017-11-30 DIAGNOSIS — J189 Pneumonia, unspecified organism: Secondary | ICD-10-CM | POA: Diagnosis not present

## 2017-11-30 DIAGNOSIS — M1991 Primary osteoarthritis, unspecified site: Secondary | ICD-10-CM | POA: Diagnosis not present

## 2017-11-30 DIAGNOSIS — F209 Schizophrenia, unspecified: Secondary | ICD-10-CM | POA: Diagnosis not present

## 2017-11-30 DIAGNOSIS — F0390 Unspecified dementia without behavioral disturbance: Secondary | ICD-10-CM | POA: Diagnosis not present

## 2017-12-01 DIAGNOSIS — F0390 Unspecified dementia without behavioral disturbance: Secondary | ICD-10-CM | POA: Diagnosis not present

## 2017-12-01 DIAGNOSIS — I10 Essential (primary) hypertension: Secondary | ICD-10-CM | POA: Diagnosis not present

## 2017-12-01 DIAGNOSIS — J441 Chronic obstructive pulmonary disease with (acute) exacerbation: Secondary | ICD-10-CM | POA: Diagnosis not present

## 2017-12-01 DIAGNOSIS — F319 Bipolar disorder, unspecified: Secondary | ICD-10-CM | POA: Diagnosis not present

## 2017-12-01 DIAGNOSIS — J189 Pneumonia, unspecified organism: Secondary | ICD-10-CM | POA: Diagnosis not present

## 2017-12-01 DIAGNOSIS — E1142 Type 2 diabetes mellitus with diabetic polyneuropathy: Secondary | ICD-10-CM | POA: Diagnosis not present

## 2017-12-01 DIAGNOSIS — M1991 Primary osteoarthritis, unspecified site: Secondary | ICD-10-CM | POA: Diagnosis not present

## 2017-12-01 DIAGNOSIS — F209 Schizophrenia, unspecified: Secondary | ICD-10-CM | POA: Diagnosis not present

## 2017-12-01 DIAGNOSIS — E039 Hypothyroidism, unspecified: Secondary | ICD-10-CM | POA: Diagnosis not present

## 2017-12-14 DIAGNOSIS — S4992XA Unspecified injury of left shoulder and upper arm, initial encounter: Secondary | ICD-10-CM | POA: Diagnosis not present

## 2017-12-14 DIAGNOSIS — M25512 Pain in left shoulder: Secondary | ICD-10-CM | POA: Diagnosis not present

## 2017-12-14 DIAGNOSIS — M069 Rheumatoid arthritis, unspecified: Secondary | ICD-10-CM | POA: Diagnosis not present

## 2017-12-15 DIAGNOSIS — M25512 Pain in left shoulder: Secondary | ICD-10-CM | POA: Diagnosis not present

## 2017-12-15 DIAGNOSIS — S4992XA Unspecified injury of left shoulder and upper arm, initial encounter: Secondary | ICD-10-CM | POA: Diagnosis not present

## 2017-12-19 DIAGNOSIS — I10 Essential (primary) hypertension: Secondary | ICD-10-CM | POA: Diagnosis not present

## 2017-12-19 DIAGNOSIS — F0390 Unspecified dementia without behavioral disturbance: Secondary | ICD-10-CM | POA: Diagnosis not present

## 2017-12-19 DIAGNOSIS — E871 Hypo-osmolality and hyponatremia: Secondary | ICD-10-CM | POA: Diagnosis not present

## 2017-12-19 DIAGNOSIS — J449 Chronic obstructive pulmonary disease, unspecified: Secondary | ICD-10-CM | POA: Diagnosis not present

## 2017-12-19 DIAGNOSIS — R42 Dizziness and giddiness: Secondary | ICD-10-CM | POA: Diagnosis not present

## 2017-12-19 DIAGNOSIS — I951 Orthostatic hypotension: Secondary | ICD-10-CM | POA: Diagnosis not present

## 2017-12-19 DIAGNOSIS — Z87891 Personal history of nicotine dependence: Secondary | ICD-10-CM | POA: Diagnosis not present

## 2017-12-21 DIAGNOSIS — F209 Schizophrenia, unspecified: Secondary | ICD-10-CM | POA: Diagnosis not present

## 2018-01-18 DIAGNOSIS — F209 Schizophrenia, unspecified: Secondary | ICD-10-CM | POA: Diagnosis not present

## 2018-02-07 ENCOUNTER — Encounter: Payer: Self-pay | Admitting: Gastroenterology

## 2018-02-08 DIAGNOSIS — R6889 Other general symptoms and signs: Secondary | ICD-10-CM | POA: Diagnosis not present

## 2018-02-08 DIAGNOSIS — M21611 Bunion of right foot: Secondary | ICD-10-CM | POA: Diagnosis not present

## 2018-02-08 DIAGNOSIS — F3162 Bipolar disorder, current episode mixed, moderate: Secondary | ICD-10-CM | POA: Diagnosis not present

## 2018-02-08 DIAGNOSIS — R110 Nausea: Secondary | ICD-10-CM | POA: Diagnosis not present

## 2018-02-13 DIAGNOSIS — E1159 Type 2 diabetes mellitus with other circulatory complications: Secondary | ICD-10-CM | POA: Diagnosis not present

## 2018-02-13 DIAGNOSIS — E785 Hyperlipidemia, unspecified: Secondary | ICD-10-CM | POA: Diagnosis not present

## 2018-02-22 DIAGNOSIS — Z139 Encounter for screening, unspecified: Secondary | ICD-10-CM | POA: Diagnosis not present

## 2018-02-22 DIAGNOSIS — L03119 Cellulitis of unspecified part of limb: Secondary | ICD-10-CM | POA: Diagnosis not present

## 2018-02-22 DIAGNOSIS — B370 Candidal stomatitis: Secondary | ICD-10-CM | POA: Diagnosis not present

## 2018-03-07 DIAGNOSIS — Z6830 Body mass index (BMI) 30.0-30.9, adult: Secondary | ICD-10-CM | POA: Diagnosis not present

## 2018-03-07 DIAGNOSIS — E1169 Type 2 diabetes mellitus with other specified complication: Secondary | ICD-10-CM | POA: Diagnosis not present

## 2018-03-07 DIAGNOSIS — L03119 Cellulitis of unspecified part of limb: Secondary | ICD-10-CM | POA: Diagnosis not present

## 2018-03-07 DIAGNOSIS — E1159 Type 2 diabetes mellitus with other circulatory complications: Secondary | ICD-10-CM | POA: Diagnosis not present

## 2018-03-07 DIAGNOSIS — E1140 Type 2 diabetes mellitus with diabetic neuropathy, unspecified: Secondary | ICD-10-CM | POA: Diagnosis not present

## 2018-03-07 DIAGNOSIS — R600 Localized edema: Secondary | ICD-10-CM | POA: Diagnosis not present

## 2018-03-07 DIAGNOSIS — Z139 Encounter for screening, unspecified: Secondary | ICD-10-CM | POA: Diagnosis not present

## 2018-03-20 DIAGNOSIS — E785 Hyperlipidemia, unspecified: Secondary | ICD-10-CM | POA: Diagnosis not present

## 2018-03-20 DIAGNOSIS — E1169 Type 2 diabetes mellitus with other specified complication: Secondary | ICD-10-CM | POA: Diagnosis not present

## 2018-03-20 DIAGNOSIS — E1159 Type 2 diabetes mellitus with other circulatory complications: Secondary | ICD-10-CM | POA: Diagnosis not present

## 2018-03-20 DIAGNOSIS — Z23 Encounter for immunization: Secondary | ICD-10-CM | POA: Diagnosis not present

## 2018-03-20 DIAGNOSIS — I10 Essential (primary) hypertension: Secondary | ICD-10-CM | POA: Diagnosis not present

## 2018-03-20 DIAGNOSIS — Z9181 History of falling: Secondary | ICD-10-CM | POA: Diagnosis not present

## 2018-03-28 DIAGNOSIS — M05772 Rheumatoid arthritis with rheumatoid factor of left ankle and foot without organ or systems involvement: Secondary | ICD-10-CM | POA: Diagnosis not present

## 2018-03-28 DIAGNOSIS — M05771 Rheumatoid arthritis with rheumatoid factor of right ankle and foot without organ or systems involvement: Secondary | ICD-10-CM | POA: Diagnosis not present

## 2018-03-28 DIAGNOSIS — M2011 Hallux valgus (acquired), right foot: Secondary | ICD-10-CM | POA: Diagnosis not present

## 2018-03-28 DIAGNOSIS — M2012 Hallux valgus (acquired), left foot: Secondary | ICD-10-CM | POA: Diagnosis not present

## 2018-03-28 DIAGNOSIS — E119 Type 2 diabetes mellitus without complications: Secondary | ICD-10-CM | POA: Diagnosis not present

## 2018-04-05 DIAGNOSIS — F209 Schizophrenia, unspecified: Secondary | ICD-10-CM | POA: Diagnosis not present

## 2018-06-14 DIAGNOSIS — E1169 Type 2 diabetes mellitus with other specified complication: Secondary | ICD-10-CM | POA: Diagnosis not present

## 2018-06-14 DIAGNOSIS — E1159 Type 2 diabetes mellitus with other circulatory complications: Secondary | ICD-10-CM | POA: Diagnosis not present

## 2018-06-14 DIAGNOSIS — E039 Hypothyroidism, unspecified: Secondary | ICD-10-CM | POA: Diagnosis not present

## 2018-06-21 DIAGNOSIS — E1140 Type 2 diabetes mellitus with diabetic neuropathy, unspecified: Secondary | ICD-10-CM | POA: Diagnosis not present

## 2018-06-21 DIAGNOSIS — I10 Essential (primary) hypertension: Secondary | ICD-10-CM | POA: Diagnosis not present

## 2018-06-21 DIAGNOSIS — E1169 Type 2 diabetes mellitus with other specified complication: Secondary | ICD-10-CM | POA: Diagnosis not present

## 2018-06-21 DIAGNOSIS — E1159 Type 2 diabetes mellitus with other circulatory complications: Secondary | ICD-10-CM | POA: Diagnosis not present

## 2018-06-30 DIAGNOSIS — E785 Hyperlipidemia, unspecified: Secondary | ICD-10-CM | POA: Diagnosis not present

## 2018-06-30 DIAGNOSIS — J9811 Atelectasis: Secondary | ICD-10-CM | POA: Diagnosis not present

## 2018-06-30 DIAGNOSIS — R0902 Hypoxemia: Secondary | ICD-10-CM

## 2018-06-30 DIAGNOSIS — J440 Chronic obstructive pulmonary disease with acute lower respiratory infection: Secondary | ICD-10-CM

## 2018-06-30 DIAGNOSIS — M1990 Unspecified osteoarthritis, unspecified site: Secondary | ICD-10-CM | POA: Diagnosis not present

## 2018-06-30 DIAGNOSIS — M069 Rheumatoid arthritis, unspecified: Secondary | ICD-10-CM | POA: Diagnosis not present

## 2018-06-30 DIAGNOSIS — Z79899 Other long term (current) drug therapy: Secondary | ICD-10-CM | POA: Diagnosis not present

## 2018-06-30 DIAGNOSIS — Z7984 Long term (current) use of oral hypoglycemic drugs: Secondary | ICD-10-CM | POA: Diagnosis not present

## 2018-06-30 DIAGNOSIS — E039 Hypothyroidism, unspecified: Secondary | ICD-10-CM

## 2018-06-30 DIAGNOSIS — J9601 Acute respiratory failure with hypoxia: Secondary | ICD-10-CM | POA: Diagnosis not present

## 2018-06-30 DIAGNOSIS — J441 Chronic obstructive pulmonary disease with (acute) exacerbation: Secondary | ICD-10-CM | POA: Diagnosis not present

## 2018-06-30 DIAGNOSIS — R410 Disorientation, unspecified: Secondary | ICD-10-CM | POA: Diagnosis not present

## 2018-06-30 DIAGNOSIS — J189 Pneumonia, unspecified organism: Secondary | ICD-10-CM

## 2018-06-30 DIAGNOSIS — R5381 Other malaise: Secondary | ICD-10-CM | POA: Diagnosis not present

## 2018-06-30 DIAGNOSIS — Z87891 Personal history of nicotine dependence: Secondary | ICD-10-CM | POA: Diagnosis not present

## 2018-06-30 DIAGNOSIS — R4781 Slurred speech: Secondary | ICD-10-CM | POA: Diagnosis not present

## 2018-06-30 DIAGNOSIS — E119 Type 2 diabetes mellitus without complications: Secondary | ICD-10-CM | POA: Diagnosis not present

## 2018-06-30 DIAGNOSIS — J449 Chronic obstructive pulmonary disease, unspecified: Secondary | ICD-10-CM | POA: Diagnosis not present

## 2018-06-30 DIAGNOSIS — Z7401 Bed confinement status: Secondary | ICD-10-CM | POA: Diagnosis not present

## 2018-06-30 DIAGNOSIS — J069 Acute upper respiratory infection, unspecified: Secondary | ICD-10-CM | POA: Diagnosis not present

## 2018-07-05 DIAGNOSIS — Z9981 Dependence on supplemental oxygen: Secondary | ICD-10-CM | POA: Diagnosis not present

## 2018-07-05 DIAGNOSIS — E1142 Type 2 diabetes mellitus with diabetic polyneuropathy: Secondary | ICD-10-CM | POA: Diagnosis not present

## 2018-07-05 DIAGNOSIS — J189 Pneumonia, unspecified organism: Secondary | ICD-10-CM | POA: Diagnosis not present

## 2018-07-05 DIAGNOSIS — I10 Essential (primary) hypertension: Secondary | ICD-10-CM | POA: Diagnosis not present

## 2018-07-05 DIAGNOSIS — J441 Chronic obstructive pulmonary disease with (acute) exacerbation: Secondary | ICD-10-CM | POA: Diagnosis not present

## 2018-07-05 DIAGNOSIS — M1991 Primary osteoarthritis, unspecified site: Secondary | ICD-10-CM | POA: Diagnosis not present

## 2018-07-05 DIAGNOSIS — Z87440 Personal history of urinary (tract) infections: Secondary | ICD-10-CM | POA: Diagnosis not present

## 2018-07-05 DIAGNOSIS — E7800 Pure hypercholesterolemia, unspecified: Secondary | ICD-10-CM | POA: Diagnosis not present

## 2018-07-05 DIAGNOSIS — Z7984 Long term (current) use of oral hypoglycemic drugs: Secondary | ICD-10-CM | POA: Diagnosis not present

## 2018-07-05 DIAGNOSIS — Z7951 Long term (current) use of inhaled steroids: Secondary | ICD-10-CM | POA: Diagnosis not present

## 2018-07-05 DIAGNOSIS — Z9181 History of falling: Secondary | ICD-10-CM | POA: Diagnosis not present

## 2018-07-05 DIAGNOSIS — Z87891 Personal history of nicotine dependence: Secondary | ICD-10-CM | POA: Diagnosis not present

## 2018-07-05 DIAGNOSIS — E039 Hypothyroidism, unspecified: Secondary | ICD-10-CM | POA: Diagnosis not present

## 2018-07-05 DIAGNOSIS — J440 Chronic obstructive pulmonary disease with acute lower respiratory infection: Secondary | ICD-10-CM | POA: Diagnosis not present

## 2018-07-05 DIAGNOSIS — Z7952 Long term (current) use of systemic steroids: Secondary | ICD-10-CM | POA: Diagnosis not present

## 2018-07-05 DIAGNOSIS — M069 Rheumatoid arthritis, unspecified: Secondary | ICD-10-CM | POA: Diagnosis not present

## 2018-07-07 DIAGNOSIS — M1991 Primary osteoarthritis, unspecified site: Secondary | ICD-10-CM | POA: Diagnosis not present

## 2018-07-07 DIAGNOSIS — Z9181 History of falling: Secondary | ICD-10-CM | POA: Diagnosis not present

## 2018-07-07 DIAGNOSIS — Z7951 Long term (current) use of inhaled steroids: Secondary | ICD-10-CM | POA: Diagnosis not present

## 2018-07-07 DIAGNOSIS — Z7952 Long term (current) use of systemic steroids: Secondary | ICD-10-CM | POA: Diagnosis not present

## 2018-07-07 DIAGNOSIS — Z9981 Dependence on supplemental oxygen: Secondary | ICD-10-CM | POA: Diagnosis not present

## 2018-07-07 DIAGNOSIS — J440 Chronic obstructive pulmonary disease with acute lower respiratory infection: Secondary | ICD-10-CM | POA: Diagnosis not present

## 2018-07-07 DIAGNOSIS — Z87891 Personal history of nicotine dependence: Secondary | ICD-10-CM | POA: Diagnosis not present

## 2018-07-07 DIAGNOSIS — J189 Pneumonia, unspecified organism: Secondary | ICD-10-CM | POA: Diagnosis not present

## 2018-07-07 DIAGNOSIS — E039 Hypothyroidism, unspecified: Secondary | ICD-10-CM | POA: Diagnosis not present

## 2018-07-07 DIAGNOSIS — Z7984 Long term (current) use of oral hypoglycemic drugs: Secondary | ICD-10-CM | POA: Diagnosis not present

## 2018-07-07 DIAGNOSIS — J441 Chronic obstructive pulmonary disease with (acute) exacerbation: Secondary | ICD-10-CM | POA: Diagnosis not present

## 2018-07-07 DIAGNOSIS — E7800 Pure hypercholesterolemia, unspecified: Secondary | ICD-10-CM | POA: Diagnosis not present

## 2018-07-07 DIAGNOSIS — I10 Essential (primary) hypertension: Secondary | ICD-10-CM | POA: Diagnosis not present

## 2018-07-07 DIAGNOSIS — Z87440 Personal history of urinary (tract) infections: Secondary | ICD-10-CM | POA: Diagnosis not present

## 2018-07-07 DIAGNOSIS — E1142 Type 2 diabetes mellitus with diabetic polyneuropathy: Secondary | ICD-10-CM | POA: Diagnosis not present

## 2018-07-07 DIAGNOSIS — M069 Rheumatoid arthritis, unspecified: Secondary | ICD-10-CM | POA: Diagnosis not present

## 2018-07-10 DIAGNOSIS — I10 Essential (primary) hypertension: Secondary | ICD-10-CM | POA: Diagnosis not present

## 2018-07-10 DIAGNOSIS — Z87440 Personal history of urinary (tract) infections: Secondary | ICD-10-CM | POA: Diagnosis not present

## 2018-07-10 DIAGNOSIS — E1142 Type 2 diabetes mellitus with diabetic polyneuropathy: Secondary | ICD-10-CM | POA: Diagnosis not present

## 2018-07-10 DIAGNOSIS — Z9181 History of falling: Secondary | ICD-10-CM | POA: Diagnosis not present

## 2018-07-10 DIAGNOSIS — Z7984 Long term (current) use of oral hypoglycemic drugs: Secondary | ICD-10-CM | POA: Diagnosis not present

## 2018-07-10 DIAGNOSIS — E039 Hypothyroidism, unspecified: Secondary | ICD-10-CM | POA: Diagnosis not present

## 2018-07-10 DIAGNOSIS — Z7951 Long term (current) use of inhaled steroids: Secondary | ICD-10-CM | POA: Diagnosis not present

## 2018-07-10 DIAGNOSIS — J441 Chronic obstructive pulmonary disease with (acute) exacerbation: Secondary | ICD-10-CM | POA: Diagnosis not present

## 2018-07-10 DIAGNOSIS — M069 Rheumatoid arthritis, unspecified: Secondary | ICD-10-CM | POA: Diagnosis not present

## 2018-07-10 DIAGNOSIS — E7800 Pure hypercholesterolemia, unspecified: Secondary | ICD-10-CM | POA: Diagnosis not present

## 2018-07-10 DIAGNOSIS — Z9981 Dependence on supplemental oxygen: Secondary | ICD-10-CM | POA: Diagnosis not present

## 2018-07-10 DIAGNOSIS — Z87891 Personal history of nicotine dependence: Secondary | ICD-10-CM | POA: Diagnosis not present

## 2018-07-10 DIAGNOSIS — J440 Chronic obstructive pulmonary disease with acute lower respiratory infection: Secondary | ICD-10-CM | POA: Diagnosis not present

## 2018-07-10 DIAGNOSIS — J189 Pneumonia, unspecified organism: Secondary | ICD-10-CM | POA: Diagnosis not present

## 2018-07-10 DIAGNOSIS — M1991 Primary osteoarthritis, unspecified site: Secondary | ICD-10-CM | POA: Diagnosis not present

## 2018-07-10 DIAGNOSIS — Z7952 Long term (current) use of systemic steroids: Secondary | ICD-10-CM | POA: Diagnosis not present

## 2018-07-11 DIAGNOSIS — J441 Chronic obstructive pulmonary disease with (acute) exacerbation: Secondary | ICD-10-CM | POA: Diagnosis not present

## 2018-07-11 DIAGNOSIS — Z7984 Long term (current) use of oral hypoglycemic drugs: Secondary | ICD-10-CM | POA: Diagnosis not present

## 2018-07-11 DIAGNOSIS — Z9181 History of falling: Secondary | ICD-10-CM | POA: Diagnosis not present

## 2018-07-11 DIAGNOSIS — M069 Rheumatoid arthritis, unspecified: Secondary | ICD-10-CM | POA: Diagnosis not present

## 2018-07-11 DIAGNOSIS — Z9981 Dependence on supplemental oxygen: Secondary | ICD-10-CM | POA: Diagnosis not present

## 2018-07-11 DIAGNOSIS — E1142 Type 2 diabetes mellitus with diabetic polyneuropathy: Secondary | ICD-10-CM | POA: Diagnosis not present

## 2018-07-11 DIAGNOSIS — Z7951 Long term (current) use of inhaled steroids: Secondary | ICD-10-CM | POA: Diagnosis not present

## 2018-07-11 DIAGNOSIS — J440 Chronic obstructive pulmonary disease with acute lower respiratory infection: Secondary | ICD-10-CM | POA: Diagnosis not present

## 2018-07-11 DIAGNOSIS — Z7952 Long term (current) use of systemic steroids: Secondary | ICD-10-CM | POA: Diagnosis not present

## 2018-07-11 DIAGNOSIS — Z87440 Personal history of urinary (tract) infections: Secondary | ICD-10-CM | POA: Diagnosis not present

## 2018-07-11 DIAGNOSIS — E039 Hypothyroidism, unspecified: Secondary | ICD-10-CM | POA: Diagnosis not present

## 2018-07-11 DIAGNOSIS — Z87891 Personal history of nicotine dependence: Secondary | ICD-10-CM | POA: Diagnosis not present

## 2018-07-11 DIAGNOSIS — I10 Essential (primary) hypertension: Secondary | ICD-10-CM | POA: Diagnosis not present

## 2018-07-11 DIAGNOSIS — J189 Pneumonia, unspecified organism: Secondary | ICD-10-CM | POA: Diagnosis not present

## 2018-07-11 DIAGNOSIS — E7800 Pure hypercholesterolemia, unspecified: Secondary | ICD-10-CM | POA: Diagnosis not present

## 2018-07-11 DIAGNOSIS — M1991 Primary osteoarthritis, unspecified site: Secondary | ICD-10-CM | POA: Diagnosis not present

## 2018-07-12 DIAGNOSIS — Z7952 Long term (current) use of systemic steroids: Secondary | ICD-10-CM | POA: Diagnosis not present

## 2018-07-12 DIAGNOSIS — J189 Pneumonia, unspecified organism: Secondary | ICD-10-CM | POA: Diagnosis not present

## 2018-07-12 DIAGNOSIS — I10 Essential (primary) hypertension: Secondary | ICD-10-CM | POA: Diagnosis not present

## 2018-07-12 DIAGNOSIS — M069 Rheumatoid arthritis, unspecified: Secondary | ICD-10-CM | POA: Diagnosis not present

## 2018-07-12 DIAGNOSIS — Z9181 History of falling: Secondary | ICD-10-CM | POA: Diagnosis not present

## 2018-07-12 DIAGNOSIS — J440 Chronic obstructive pulmonary disease with acute lower respiratory infection: Secondary | ICD-10-CM | POA: Diagnosis not present

## 2018-07-12 DIAGNOSIS — E1142 Type 2 diabetes mellitus with diabetic polyneuropathy: Secondary | ICD-10-CM | POA: Diagnosis not present

## 2018-07-12 DIAGNOSIS — J449 Chronic obstructive pulmonary disease, unspecified: Secondary | ICD-10-CM | POA: Diagnosis not present

## 2018-07-12 DIAGNOSIS — Z9981 Dependence on supplemental oxygen: Secondary | ICD-10-CM | POA: Diagnosis not present

## 2018-07-12 DIAGNOSIS — Z7984 Long term (current) use of oral hypoglycemic drugs: Secondary | ICD-10-CM | POA: Diagnosis not present

## 2018-07-12 DIAGNOSIS — Z9189 Other specified personal risk factors, not elsewhere classified: Secondary | ICD-10-CM | POA: Diagnosis not present

## 2018-07-12 DIAGNOSIS — Z7951 Long term (current) use of inhaled steroids: Secondary | ICD-10-CM | POA: Diagnosis not present

## 2018-07-12 DIAGNOSIS — J441 Chronic obstructive pulmonary disease with (acute) exacerbation: Secondary | ICD-10-CM | POA: Diagnosis not present

## 2018-07-12 DIAGNOSIS — M1991 Primary osteoarthritis, unspecified site: Secondary | ICD-10-CM | POA: Diagnosis not present

## 2018-07-12 DIAGNOSIS — E039 Hypothyroidism, unspecified: Secondary | ICD-10-CM | POA: Diagnosis not present

## 2018-07-12 DIAGNOSIS — E7800 Pure hypercholesterolemia, unspecified: Secondary | ICD-10-CM | POA: Diagnosis not present

## 2018-07-12 DIAGNOSIS — Z87440 Personal history of urinary (tract) infections: Secondary | ICD-10-CM | POA: Diagnosis not present

## 2018-07-12 DIAGNOSIS — Z87891 Personal history of nicotine dependence: Secondary | ICD-10-CM | POA: Diagnosis not present

## 2018-07-13 DIAGNOSIS — J440 Chronic obstructive pulmonary disease with acute lower respiratory infection: Secondary | ICD-10-CM | POA: Diagnosis not present

## 2018-07-13 DIAGNOSIS — Z87440 Personal history of urinary (tract) infections: Secondary | ICD-10-CM | POA: Diagnosis not present

## 2018-07-13 DIAGNOSIS — E1142 Type 2 diabetes mellitus with diabetic polyneuropathy: Secondary | ICD-10-CM | POA: Diagnosis not present

## 2018-07-13 DIAGNOSIS — M1991 Primary osteoarthritis, unspecified site: Secondary | ICD-10-CM | POA: Diagnosis not present

## 2018-07-13 DIAGNOSIS — Z87891 Personal history of nicotine dependence: Secondary | ICD-10-CM | POA: Diagnosis not present

## 2018-07-13 DIAGNOSIS — Z7984 Long term (current) use of oral hypoglycemic drugs: Secondary | ICD-10-CM | POA: Diagnosis not present

## 2018-07-13 DIAGNOSIS — Z7951 Long term (current) use of inhaled steroids: Secondary | ICD-10-CM | POA: Diagnosis not present

## 2018-07-13 DIAGNOSIS — M069 Rheumatoid arthritis, unspecified: Secondary | ICD-10-CM | POA: Diagnosis not present

## 2018-07-13 DIAGNOSIS — E039 Hypothyroidism, unspecified: Secondary | ICD-10-CM | POA: Diagnosis not present

## 2018-07-13 DIAGNOSIS — Z9981 Dependence on supplemental oxygen: Secondary | ICD-10-CM | POA: Diagnosis not present

## 2018-07-13 DIAGNOSIS — I10 Essential (primary) hypertension: Secondary | ICD-10-CM | POA: Diagnosis not present

## 2018-07-13 DIAGNOSIS — E7800 Pure hypercholesterolemia, unspecified: Secondary | ICD-10-CM | POA: Diagnosis not present

## 2018-07-13 DIAGNOSIS — Z7952 Long term (current) use of systemic steroids: Secondary | ICD-10-CM | POA: Diagnosis not present

## 2018-07-13 DIAGNOSIS — Z9181 History of falling: Secondary | ICD-10-CM | POA: Diagnosis not present

## 2018-07-13 DIAGNOSIS — J189 Pneumonia, unspecified organism: Secondary | ICD-10-CM | POA: Diagnosis not present

## 2018-07-13 DIAGNOSIS — J441 Chronic obstructive pulmonary disease with (acute) exacerbation: Secondary | ICD-10-CM | POA: Diagnosis not present

## 2018-07-18 DIAGNOSIS — E7800 Pure hypercholesterolemia, unspecified: Secondary | ICD-10-CM | POA: Diagnosis not present

## 2018-07-18 DIAGNOSIS — E039 Hypothyroidism, unspecified: Secondary | ICD-10-CM | POA: Diagnosis not present

## 2018-07-18 DIAGNOSIS — E1142 Type 2 diabetes mellitus with diabetic polyneuropathy: Secondary | ICD-10-CM | POA: Diagnosis not present

## 2018-07-18 DIAGNOSIS — Z9181 History of falling: Secondary | ICD-10-CM | POA: Diagnosis not present

## 2018-07-18 DIAGNOSIS — Z7952 Long term (current) use of systemic steroids: Secondary | ICD-10-CM | POA: Diagnosis not present

## 2018-07-18 DIAGNOSIS — Z87440 Personal history of urinary (tract) infections: Secondary | ICD-10-CM | POA: Diagnosis not present

## 2018-07-18 DIAGNOSIS — J189 Pneumonia, unspecified organism: Secondary | ICD-10-CM | POA: Diagnosis not present

## 2018-07-18 DIAGNOSIS — J440 Chronic obstructive pulmonary disease with acute lower respiratory infection: Secondary | ICD-10-CM | POA: Diagnosis not present

## 2018-07-18 DIAGNOSIS — M1991 Primary osteoarthritis, unspecified site: Secondary | ICD-10-CM | POA: Diagnosis not present

## 2018-07-18 DIAGNOSIS — M069 Rheumatoid arthritis, unspecified: Secondary | ICD-10-CM | POA: Diagnosis not present

## 2018-07-18 DIAGNOSIS — I10 Essential (primary) hypertension: Secondary | ICD-10-CM | POA: Diagnosis not present

## 2018-07-18 DIAGNOSIS — Z7951 Long term (current) use of inhaled steroids: Secondary | ICD-10-CM | POA: Diagnosis not present

## 2018-07-18 DIAGNOSIS — J441 Chronic obstructive pulmonary disease with (acute) exacerbation: Secondary | ICD-10-CM | POA: Diagnosis not present

## 2018-07-18 DIAGNOSIS — Z87891 Personal history of nicotine dependence: Secondary | ICD-10-CM | POA: Diagnosis not present

## 2018-07-18 DIAGNOSIS — Z7984 Long term (current) use of oral hypoglycemic drugs: Secondary | ICD-10-CM | POA: Diagnosis not present

## 2018-07-18 DIAGNOSIS — Z9981 Dependence on supplemental oxygen: Secondary | ICD-10-CM | POA: Diagnosis not present

## 2018-07-19 DIAGNOSIS — M069 Rheumatoid arthritis, unspecified: Secondary | ICD-10-CM | POA: Diagnosis not present

## 2018-07-19 DIAGNOSIS — Z9981 Dependence on supplemental oxygen: Secondary | ICD-10-CM | POA: Diagnosis not present

## 2018-07-19 DIAGNOSIS — Z87891 Personal history of nicotine dependence: Secondary | ICD-10-CM | POA: Diagnosis not present

## 2018-07-19 DIAGNOSIS — Z7952 Long term (current) use of systemic steroids: Secondary | ICD-10-CM | POA: Diagnosis not present

## 2018-07-19 DIAGNOSIS — Z9181 History of falling: Secondary | ICD-10-CM | POA: Diagnosis not present

## 2018-07-19 DIAGNOSIS — E1142 Type 2 diabetes mellitus with diabetic polyneuropathy: Secondary | ICD-10-CM | POA: Diagnosis not present

## 2018-07-19 DIAGNOSIS — E7800 Pure hypercholesterolemia, unspecified: Secondary | ICD-10-CM | POA: Diagnosis not present

## 2018-07-19 DIAGNOSIS — J189 Pneumonia, unspecified organism: Secondary | ICD-10-CM | POA: Diagnosis not present

## 2018-07-19 DIAGNOSIS — M1991 Primary osteoarthritis, unspecified site: Secondary | ICD-10-CM | POA: Diagnosis not present

## 2018-07-19 DIAGNOSIS — I10 Essential (primary) hypertension: Secondary | ICD-10-CM | POA: Diagnosis not present

## 2018-07-19 DIAGNOSIS — Z7951 Long term (current) use of inhaled steroids: Secondary | ICD-10-CM | POA: Diagnosis not present

## 2018-07-19 DIAGNOSIS — J441 Chronic obstructive pulmonary disease with (acute) exacerbation: Secondary | ICD-10-CM | POA: Diagnosis not present

## 2018-07-19 DIAGNOSIS — E039 Hypothyroidism, unspecified: Secondary | ICD-10-CM | POA: Diagnosis not present

## 2018-07-19 DIAGNOSIS — J440 Chronic obstructive pulmonary disease with acute lower respiratory infection: Secondary | ICD-10-CM | POA: Diagnosis not present

## 2018-07-19 DIAGNOSIS — Z7984 Long term (current) use of oral hypoglycemic drugs: Secondary | ICD-10-CM | POA: Diagnosis not present

## 2018-07-19 DIAGNOSIS — Z87440 Personal history of urinary (tract) infections: Secondary | ICD-10-CM | POA: Diagnosis not present

## 2018-07-20 DIAGNOSIS — R05 Cough: Secondary | ICD-10-CM | POA: Diagnosis not present

## 2018-07-20 DIAGNOSIS — J189 Pneumonia, unspecified organism: Secondary | ICD-10-CM | POA: Diagnosis not present

## 2018-07-21 DIAGNOSIS — Z7951 Long term (current) use of inhaled steroids: Secondary | ICD-10-CM | POA: Diagnosis not present

## 2018-07-21 DIAGNOSIS — Z87440 Personal history of urinary (tract) infections: Secondary | ICD-10-CM | POA: Diagnosis not present

## 2018-07-21 DIAGNOSIS — Z87891 Personal history of nicotine dependence: Secondary | ICD-10-CM | POA: Diagnosis not present

## 2018-07-21 DIAGNOSIS — E1142 Type 2 diabetes mellitus with diabetic polyneuropathy: Secondary | ICD-10-CM | POA: Diagnosis not present

## 2018-07-21 DIAGNOSIS — I10 Essential (primary) hypertension: Secondary | ICD-10-CM | POA: Diagnosis not present

## 2018-07-21 DIAGNOSIS — Z9181 History of falling: Secondary | ICD-10-CM | POA: Diagnosis not present

## 2018-07-21 DIAGNOSIS — J189 Pneumonia, unspecified organism: Secondary | ICD-10-CM | POA: Diagnosis not present

## 2018-07-21 DIAGNOSIS — J440 Chronic obstructive pulmonary disease with acute lower respiratory infection: Secondary | ICD-10-CM | POA: Diagnosis not present

## 2018-07-21 DIAGNOSIS — E7800 Pure hypercholesterolemia, unspecified: Secondary | ICD-10-CM | POA: Diagnosis not present

## 2018-07-21 DIAGNOSIS — Z7984 Long term (current) use of oral hypoglycemic drugs: Secondary | ICD-10-CM | POA: Diagnosis not present

## 2018-07-21 DIAGNOSIS — Z7952 Long term (current) use of systemic steroids: Secondary | ICD-10-CM | POA: Diagnosis not present

## 2018-07-21 DIAGNOSIS — J441 Chronic obstructive pulmonary disease with (acute) exacerbation: Secondary | ICD-10-CM | POA: Diagnosis not present

## 2018-07-21 DIAGNOSIS — M1991 Primary osteoarthritis, unspecified site: Secondary | ICD-10-CM | POA: Diagnosis not present

## 2018-07-21 DIAGNOSIS — E039 Hypothyroidism, unspecified: Secondary | ICD-10-CM | POA: Diagnosis not present

## 2018-07-21 DIAGNOSIS — M069 Rheumatoid arthritis, unspecified: Secondary | ICD-10-CM | POA: Diagnosis not present

## 2018-07-21 DIAGNOSIS — Z9981 Dependence on supplemental oxygen: Secondary | ICD-10-CM | POA: Diagnosis not present

## 2018-07-25 DIAGNOSIS — J189 Pneumonia, unspecified organism: Secondary | ICD-10-CM | POA: Diagnosis not present

## 2018-07-25 DIAGNOSIS — I10 Essential (primary) hypertension: Secondary | ICD-10-CM | POA: Diagnosis not present

## 2018-07-25 DIAGNOSIS — Z7951 Long term (current) use of inhaled steroids: Secondary | ICD-10-CM | POA: Diagnosis not present

## 2018-07-25 DIAGNOSIS — M1991 Primary osteoarthritis, unspecified site: Secondary | ICD-10-CM | POA: Diagnosis not present

## 2018-07-25 DIAGNOSIS — Z87891 Personal history of nicotine dependence: Secondary | ICD-10-CM | POA: Diagnosis not present

## 2018-07-25 DIAGNOSIS — M069 Rheumatoid arthritis, unspecified: Secondary | ICD-10-CM | POA: Diagnosis not present

## 2018-07-25 DIAGNOSIS — E7800 Pure hypercholesterolemia, unspecified: Secondary | ICD-10-CM | POA: Diagnosis not present

## 2018-07-25 DIAGNOSIS — Z9181 History of falling: Secondary | ICD-10-CM | POA: Diagnosis not present

## 2018-07-25 DIAGNOSIS — E039 Hypothyroidism, unspecified: Secondary | ICD-10-CM | POA: Diagnosis not present

## 2018-07-25 DIAGNOSIS — E1142 Type 2 diabetes mellitus with diabetic polyneuropathy: Secondary | ICD-10-CM | POA: Diagnosis not present

## 2018-07-25 DIAGNOSIS — Z7984 Long term (current) use of oral hypoglycemic drugs: Secondary | ICD-10-CM | POA: Diagnosis not present

## 2018-07-25 DIAGNOSIS — Z9981 Dependence on supplemental oxygen: Secondary | ICD-10-CM | POA: Diagnosis not present

## 2018-07-25 DIAGNOSIS — J441 Chronic obstructive pulmonary disease with (acute) exacerbation: Secondary | ICD-10-CM | POA: Diagnosis not present

## 2018-07-25 DIAGNOSIS — Z87440 Personal history of urinary (tract) infections: Secondary | ICD-10-CM | POA: Diagnosis not present

## 2018-07-25 DIAGNOSIS — J440 Chronic obstructive pulmonary disease with acute lower respiratory infection: Secondary | ICD-10-CM | POA: Diagnosis not present

## 2018-07-25 DIAGNOSIS — Z7952 Long term (current) use of systemic steroids: Secondary | ICD-10-CM | POA: Diagnosis not present

## 2018-07-26 DIAGNOSIS — J189 Pneumonia, unspecified organism: Secondary | ICD-10-CM | POA: Diagnosis not present

## 2018-07-26 DIAGNOSIS — Z7189 Other specified counseling: Secondary | ICD-10-CM | POA: Diagnosis not present

## 2018-08-02 DIAGNOSIS — Z7952 Long term (current) use of systemic steroids: Secondary | ICD-10-CM | POA: Diagnosis not present

## 2018-08-02 DIAGNOSIS — Z7984 Long term (current) use of oral hypoglycemic drugs: Secondary | ICD-10-CM | POA: Diagnosis not present

## 2018-08-02 DIAGNOSIS — M1991 Primary osteoarthritis, unspecified site: Secondary | ICD-10-CM | POA: Diagnosis not present

## 2018-08-02 DIAGNOSIS — J440 Chronic obstructive pulmonary disease with acute lower respiratory infection: Secondary | ICD-10-CM | POA: Diagnosis not present

## 2018-08-02 DIAGNOSIS — Z87440 Personal history of urinary (tract) infections: Secondary | ICD-10-CM | POA: Diagnosis not present

## 2018-08-02 DIAGNOSIS — J441 Chronic obstructive pulmonary disease with (acute) exacerbation: Secondary | ICD-10-CM | POA: Diagnosis not present

## 2018-08-02 DIAGNOSIS — Z9181 History of falling: Secondary | ICD-10-CM | POA: Diagnosis not present

## 2018-08-02 DIAGNOSIS — Z87891 Personal history of nicotine dependence: Secondary | ICD-10-CM | POA: Diagnosis not present

## 2018-08-02 DIAGNOSIS — M069 Rheumatoid arthritis, unspecified: Secondary | ICD-10-CM | POA: Diagnosis not present

## 2018-08-02 DIAGNOSIS — E1142 Type 2 diabetes mellitus with diabetic polyneuropathy: Secondary | ICD-10-CM | POA: Diagnosis not present

## 2018-08-02 DIAGNOSIS — I10 Essential (primary) hypertension: Secondary | ICD-10-CM | POA: Diagnosis not present

## 2018-08-02 DIAGNOSIS — J189 Pneumonia, unspecified organism: Secondary | ICD-10-CM | POA: Diagnosis not present

## 2018-08-02 DIAGNOSIS — Z9981 Dependence on supplemental oxygen: Secondary | ICD-10-CM | POA: Diagnosis not present

## 2018-08-02 DIAGNOSIS — E039 Hypothyroidism, unspecified: Secondary | ICD-10-CM | POA: Diagnosis not present

## 2018-08-02 DIAGNOSIS — E7800 Pure hypercholesterolemia, unspecified: Secondary | ICD-10-CM | POA: Diagnosis not present

## 2018-08-02 DIAGNOSIS — Z7951 Long term (current) use of inhaled steroids: Secondary | ICD-10-CM | POA: Diagnosis not present

## 2018-08-17 DIAGNOSIS — Z0000 Encounter for general adult medical examination without abnormal findings: Secondary | ICD-10-CM | POA: Diagnosis not present

## 2018-08-17 DIAGNOSIS — Z139 Encounter for screening, unspecified: Secondary | ICD-10-CM | POA: Diagnosis not present

## 2018-10-01 DIAGNOSIS — R509 Fever, unspecified: Secondary | ICD-10-CM | POA: Diagnosis not present

## 2018-10-01 DIAGNOSIS — R0902 Hypoxemia: Secondary | ICD-10-CM | POA: Diagnosis not present

## 2018-10-01 DIAGNOSIS — R0602 Shortness of breath: Secondary | ICD-10-CM | POA: Diagnosis not present

## 2018-10-01 DIAGNOSIS — R069 Unspecified abnormalities of breathing: Secondary | ICD-10-CM | POA: Diagnosis not present

## 2018-10-02 DIAGNOSIS — Z792 Long term (current) use of antibiotics: Secondary | ICD-10-CM | POA: Diagnosis not present

## 2018-10-02 DIAGNOSIS — E039 Hypothyroidism, unspecified: Secondary | ICD-10-CM | POA: Diagnosis not present

## 2018-10-02 DIAGNOSIS — J440 Chronic obstructive pulmonary disease with acute lower respiratory infection: Secondary | ICD-10-CM | POA: Diagnosis not present

## 2018-10-02 DIAGNOSIS — J449 Chronic obstructive pulmonary disease, unspecified: Secondary | ICD-10-CM | POA: Diagnosis not present

## 2018-10-02 DIAGNOSIS — I2693 Single subsegmental pulmonary embolism without acute cor pulmonale: Secondary | ICD-10-CM | POA: Diagnosis not present

## 2018-10-02 DIAGNOSIS — Z7984 Long term (current) use of oral hypoglycemic drugs: Secondary | ICD-10-CM | POA: Diagnosis not present

## 2018-10-02 DIAGNOSIS — E119 Type 2 diabetes mellitus without complications: Secondary | ICD-10-CM | POA: Diagnosis not present

## 2018-10-02 DIAGNOSIS — J441 Chronic obstructive pulmonary disease with (acute) exacerbation: Secondary | ICD-10-CM | POA: Diagnosis not present

## 2018-10-02 DIAGNOSIS — Z8669 Personal history of other diseases of the nervous system and sense organs: Secondary | ICD-10-CM | POA: Diagnosis not present

## 2018-10-02 DIAGNOSIS — J189 Pneumonia, unspecified organism: Secondary | ICD-10-CM | POA: Diagnosis not present

## 2018-10-02 DIAGNOSIS — M1990 Unspecified osteoarthritis, unspecified site: Secondary | ICD-10-CM | POA: Diagnosis not present

## 2018-10-02 DIAGNOSIS — R509 Fever, unspecified: Secondary | ICD-10-CM | POA: Diagnosis not present

## 2018-10-02 DIAGNOSIS — Z87891 Personal history of nicotine dependence: Secondary | ICD-10-CM | POA: Diagnosis not present

## 2018-10-02 DIAGNOSIS — Z7952 Long term (current) use of systemic steroids: Secondary | ICD-10-CM | POA: Diagnosis not present

## 2018-10-02 DIAGNOSIS — R0602 Shortness of breath: Secondary | ICD-10-CM | POA: Diagnosis not present

## 2018-10-02 DIAGNOSIS — R0902 Hypoxemia: Secondary | ICD-10-CM | POA: Diagnosis not present

## 2018-10-02 DIAGNOSIS — F0390 Unspecified dementia without behavioral disturbance: Secondary | ICD-10-CM

## 2018-10-02 DIAGNOSIS — D72829 Elevated white blood cell count, unspecified: Secondary | ICD-10-CM | POA: Diagnosis not present

## 2018-10-02 DIAGNOSIS — I10 Essential (primary) hypertension: Secondary | ICD-10-CM | POA: Diagnosis not present

## 2018-10-02 DIAGNOSIS — M069 Rheumatoid arthritis, unspecified: Secondary | ICD-10-CM | POA: Diagnosis not present

## 2018-10-02 DIAGNOSIS — K219 Gastro-esophageal reflux disease without esophagitis: Secondary | ICD-10-CM | POA: Diagnosis not present

## 2018-10-02 DIAGNOSIS — Z22322 Carrier or suspected carrier of Methicillin resistant Staphylococcus aureus: Secondary | ICD-10-CM | POA: Diagnosis not present

## 2018-10-02 DIAGNOSIS — E785 Hyperlipidemia, unspecified: Secondary | ICD-10-CM | POA: Diagnosis not present

## 2018-10-02 DIAGNOSIS — I2782 Chronic pulmonary embolism: Secondary | ICD-10-CM | POA: Diagnosis not present

## 2018-10-02 DIAGNOSIS — J9601 Acute respiratory failure with hypoxia: Secondary | ICD-10-CM | POA: Diagnosis not present

## 2018-10-06 DIAGNOSIS — E039 Hypothyroidism, unspecified: Secondary | ICD-10-CM

## 2018-10-08 DIAGNOSIS — M1991 Primary osteoarthritis, unspecified site: Secondary | ICD-10-CM | POA: Diagnosis not present

## 2018-10-08 DIAGNOSIS — M069 Rheumatoid arthritis, unspecified: Secondary | ICD-10-CM | POA: Diagnosis not present

## 2018-10-08 DIAGNOSIS — Z7952 Long term (current) use of systemic steroids: Secondary | ICD-10-CM | POA: Diagnosis not present

## 2018-10-08 DIAGNOSIS — E1142 Type 2 diabetes mellitus with diabetic polyneuropathy: Secondary | ICD-10-CM | POA: Diagnosis not present

## 2018-10-08 DIAGNOSIS — I10 Essential (primary) hypertension: Secondary | ICD-10-CM | POA: Diagnosis not present

## 2018-10-08 DIAGNOSIS — Z9181 History of falling: Secondary | ICD-10-CM | POA: Diagnosis not present

## 2018-10-08 DIAGNOSIS — Z87440 Personal history of urinary (tract) infections: Secondary | ICD-10-CM | POA: Diagnosis not present

## 2018-10-08 DIAGNOSIS — Z7984 Long term (current) use of oral hypoglycemic drugs: Secondary | ICD-10-CM | POA: Diagnosis not present

## 2018-10-08 DIAGNOSIS — Z7951 Long term (current) use of inhaled steroids: Secondary | ICD-10-CM | POA: Diagnosis not present

## 2018-10-08 DIAGNOSIS — J189 Pneumonia, unspecified organism: Secondary | ICD-10-CM | POA: Diagnosis not present

## 2018-10-08 DIAGNOSIS — Z87891 Personal history of nicotine dependence: Secondary | ICD-10-CM | POA: Diagnosis not present

## 2018-10-08 DIAGNOSIS — E039 Hypothyroidism, unspecified: Secondary | ICD-10-CM | POA: Diagnosis not present

## 2018-10-08 DIAGNOSIS — J441 Chronic obstructive pulmonary disease with (acute) exacerbation: Secondary | ICD-10-CM | POA: Diagnosis not present

## 2018-10-08 DIAGNOSIS — Z9981 Dependence on supplemental oxygen: Secondary | ICD-10-CM | POA: Diagnosis not present

## 2018-10-08 DIAGNOSIS — J440 Chronic obstructive pulmonary disease with acute lower respiratory infection: Secondary | ICD-10-CM | POA: Diagnosis not present

## 2018-10-08 DIAGNOSIS — E7800 Pure hypercholesterolemia, unspecified: Secondary | ICD-10-CM | POA: Diagnosis not present

## 2018-10-10 DIAGNOSIS — J441 Chronic obstructive pulmonary disease with (acute) exacerbation: Secondary | ICD-10-CM | POA: Diagnosis not present

## 2018-10-10 DIAGNOSIS — Z7984 Long term (current) use of oral hypoglycemic drugs: Secondary | ICD-10-CM | POA: Diagnosis not present

## 2018-10-10 DIAGNOSIS — M069 Rheumatoid arthritis, unspecified: Secondary | ICD-10-CM | POA: Diagnosis not present

## 2018-10-10 DIAGNOSIS — Z87440 Personal history of urinary (tract) infections: Secondary | ICD-10-CM | POA: Diagnosis not present

## 2018-10-10 DIAGNOSIS — E1142 Type 2 diabetes mellitus with diabetic polyneuropathy: Secondary | ICD-10-CM | POA: Diagnosis not present

## 2018-10-10 DIAGNOSIS — Z9181 History of falling: Secondary | ICD-10-CM | POA: Diagnosis not present

## 2018-10-10 DIAGNOSIS — I10 Essential (primary) hypertension: Secondary | ICD-10-CM | POA: Diagnosis not present

## 2018-10-10 DIAGNOSIS — Z9981 Dependence on supplemental oxygen: Secondary | ICD-10-CM | POA: Diagnosis not present

## 2018-10-10 DIAGNOSIS — Z7952 Long term (current) use of systemic steroids: Secondary | ICD-10-CM | POA: Diagnosis not present

## 2018-10-10 DIAGNOSIS — Z7951 Long term (current) use of inhaled steroids: Secondary | ICD-10-CM | POA: Diagnosis not present

## 2018-10-10 DIAGNOSIS — M1991 Primary osteoarthritis, unspecified site: Secondary | ICD-10-CM | POA: Diagnosis not present

## 2018-10-10 DIAGNOSIS — J189 Pneumonia, unspecified organism: Secondary | ICD-10-CM | POA: Diagnosis not present

## 2018-10-10 DIAGNOSIS — E7800 Pure hypercholesterolemia, unspecified: Secondary | ICD-10-CM | POA: Diagnosis not present

## 2018-10-10 DIAGNOSIS — J440 Chronic obstructive pulmonary disease with acute lower respiratory infection: Secondary | ICD-10-CM | POA: Diagnosis not present

## 2018-10-10 DIAGNOSIS — Z87891 Personal history of nicotine dependence: Secondary | ICD-10-CM | POA: Diagnosis not present

## 2018-10-10 DIAGNOSIS — E039 Hypothyroidism, unspecified: Secondary | ICD-10-CM | POA: Diagnosis not present

## 2018-10-11 DIAGNOSIS — J440 Chronic obstructive pulmonary disease with acute lower respiratory infection: Secondary | ICD-10-CM | POA: Diagnosis not present

## 2018-10-11 DIAGNOSIS — J441 Chronic obstructive pulmonary disease with (acute) exacerbation: Secondary | ICD-10-CM | POA: Diagnosis not present

## 2018-10-11 DIAGNOSIS — E039 Hypothyroidism, unspecified: Secondary | ICD-10-CM | POA: Diagnosis not present

## 2018-10-11 DIAGNOSIS — Z87440 Personal history of urinary (tract) infections: Secondary | ICD-10-CM | POA: Diagnosis not present

## 2018-10-11 DIAGNOSIS — Z9181 History of falling: Secondary | ICD-10-CM | POA: Diagnosis not present

## 2018-10-11 DIAGNOSIS — Z7952 Long term (current) use of systemic steroids: Secondary | ICD-10-CM | POA: Diagnosis not present

## 2018-10-11 DIAGNOSIS — Z87891 Personal history of nicotine dependence: Secondary | ICD-10-CM | POA: Diagnosis not present

## 2018-10-11 DIAGNOSIS — Z7984 Long term (current) use of oral hypoglycemic drugs: Secondary | ICD-10-CM | POA: Diagnosis not present

## 2018-10-11 DIAGNOSIS — E1142 Type 2 diabetes mellitus with diabetic polyneuropathy: Secondary | ICD-10-CM | POA: Diagnosis not present

## 2018-10-11 DIAGNOSIS — Z9981 Dependence on supplemental oxygen: Secondary | ICD-10-CM | POA: Diagnosis not present

## 2018-10-11 DIAGNOSIS — M069 Rheumatoid arthritis, unspecified: Secondary | ICD-10-CM | POA: Diagnosis not present

## 2018-10-11 DIAGNOSIS — J189 Pneumonia, unspecified organism: Secondary | ICD-10-CM | POA: Diagnosis not present

## 2018-10-11 DIAGNOSIS — M1991 Primary osteoarthritis, unspecified site: Secondary | ICD-10-CM | POA: Diagnosis not present

## 2018-10-11 DIAGNOSIS — Z7951 Long term (current) use of inhaled steroids: Secondary | ICD-10-CM | POA: Diagnosis not present

## 2018-10-11 DIAGNOSIS — E7800 Pure hypercholesterolemia, unspecified: Secondary | ICD-10-CM | POA: Diagnosis not present

## 2018-10-11 DIAGNOSIS — I10 Essential (primary) hypertension: Secondary | ICD-10-CM | POA: Diagnosis not present

## 2018-10-12 DIAGNOSIS — J440 Chronic obstructive pulmonary disease with acute lower respiratory infection: Secondary | ICD-10-CM | POA: Diagnosis not present

## 2018-10-12 DIAGNOSIS — Z9181 History of falling: Secondary | ICD-10-CM | POA: Diagnosis not present

## 2018-10-12 DIAGNOSIS — E039 Hypothyroidism, unspecified: Secondary | ICD-10-CM | POA: Diagnosis not present

## 2018-10-12 DIAGNOSIS — M1991 Primary osteoarthritis, unspecified site: Secondary | ICD-10-CM | POA: Diagnosis not present

## 2018-10-12 DIAGNOSIS — J189 Pneumonia, unspecified organism: Secondary | ICD-10-CM | POA: Diagnosis not present

## 2018-10-12 DIAGNOSIS — Z9981 Dependence on supplemental oxygen: Secondary | ICD-10-CM | POA: Diagnosis not present

## 2018-10-12 DIAGNOSIS — E7800 Pure hypercholesterolemia, unspecified: Secondary | ICD-10-CM | POA: Diagnosis not present

## 2018-10-12 DIAGNOSIS — Z87891 Personal history of nicotine dependence: Secondary | ICD-10-CM | POA: Diagnosis not present

## 2018-10-12 DIAGNOSIS — J441 Chronic obstructive pulmonary disease with (acute) exacerbation: Secondary | ICD-10-CM | POA: Diagnosis not present

## 2018-10-12 DIAGNOSIS — Z7952 Long term (current) use of systemic steroids: Secondary | ICD-10-CM | POA: Diagnosis not present

## 2018-10-12 DIAGNOSIS — Z7984 Long term (current) use of oral hypoglycemic drugs: Secondary | ICD-10-CM | POA: Diagnosis not present

## 2018-10-12 DIAGNOSIS — E1142 Type 2 diabetes mellitus with diabetic polyneuropathy: Secondary | ICD-10-CM | POA: Diagnosis not present

## 2018-10-12 DIAGNOSIS — M069 Rheumatoid arthritis, unspecified: Secondary | ICD-10-CM | POA: Diagnosis not present

## 2018-10-12 DIAGNOSIS — Z7951 Long term (current) use of inhaled steroids: Secondary | ICD-10-CM | POA: Diagnosis not present

## 2018-10-12 DIAGNOSIS — Z87440 Personal history of urinary (tract) infections: Secondary | ICD-10-CM | POA: Diagnosis not present

## 2018-10-12 DIAGNOSIS — I10 Essential (primary) hypertension: Secondary | ICD-10-CM | POA: Diagnosis not present

## 2018-10-13 DIAGNOSIS — Z7984 Long term (current) use of oral hypoglycemic drugs: Secondary | ICD-10-CM | POA: Diagnosis not present

## 2018-10-13 DIAGNOSIS — Z87440 Personal history of urinary (tract) infections: Secondary | ICD-10-CM | POA: Diagnosis not present

## 2018-10-13 DIAGNOSIS — E7800 Pure hypercholesterolemia, unspecified: Secondary | ICD-10-CM | POA: Diagnosis not present

## 2018-10-13 DIAGNOSIS — Z9181 History of falling: Secondary | ICD-10-CM | POA: Diagnosis not present

## 2018-10-13 DIAGNOSIS — I10 Essential (primary) hypertension: Secondary | ICD-10-CM | POA: Diagnosis not present

## 2018-10-13 DIAGNOSIS — Z7951 Long term (current) use of inhaled steroids: Secondary | ICD-10-CM | POA: Diagnosis not present

## 2018-10-13 DIAGNOSIS — J441 Chronic obstructive pulmonary disease with (acute) exacerbation: Secondary | ICD-10-CM | POA: Diagnosis not present

## 2018-10-13 DIAGNOSIS — E039 Hypothyroidism, unspecified: Secondary | ICD-10-CM | POA: Diagnosis not present

## 2018-10-13 DIAGNOSIS — M1991 Primary osteoarthritis, unspecified site: Secondary | ICD-10-CM | POA: Diagnosis not present

## 2018-10-13 DIAGNOSIS — J440 Chronic obstructive pulmonary disease with acute lower respiratory infection: Secondary | ICD-10-CM | POA: Diagnosis not present

## 2018-10-13 DIAGNOSIS — Z9981 Dependence on supplemental oxygen: Secondary | ICD-10-CM | POA: Diagnosis not present

## 2018-10-13 DIAGNOSIS — J189 Pneumonia, unspecified organism: Secondary | ICD-10-CM | POA: Diagnosis not present

## 2018-10-13 DIAGNOSIS — Z87891 Personal history of nicotine dependence: Secondary | ICD-10-CM | POA: Diagnosis not present

## 2018-10-13 DIAGNOSIS — M069 Rheumatoid arthritis, unspecified: Secondary | ICD-10-CM | POA: Diagnosis not present

## 2018-10-13 DIAGNOSIS — Z7952 Long term (current) use of systemic steroids: Secondary | ICD-10-CM | POA: Diagnosis not present

## 2018-10-13 DIAGNOSIS — E1142 Type 2 diabetes mellitus with diabetic polyneuropathy: Secondary | ICD-10-CM | POA: Diagnosis not present

## 2018-10-16 DIAGNOSIS — J440 Chronic obstructive pulmonary disease with acute lower respiratory infection: Secondary | ICD-10-CM | POA: Diagnosis not present

## 2018-10-16 DIAGNOSIS — E7800 Pure hypercholesterolemia, unspecified: Secondary | ICD-10-CM | POA: Diagnosis not present

## 2018-10-16 DIAGNOSIS — M069 Rheumatoid arthritis, unspecified: Secondary | ICD-10-CM | POA: Diagnosis not present

## 2018-10-16 DIAGNOSIS — M1991 Primary osteoarthritis, unspecified site: Secondary | ICD-10-CM | POA: Diagnosis not present

## 2018-10-16 DIAGNOSIS — E1142 Type 2 diabetes mellitus with diabetic polyneuropathy: Secondary | ICD-10-CM | POA: Diagnosis not present

## 2018-10-16 DIAGNOSIS — Z9981 Dependence on supplemental oxygen: Secondary | ICD-10-CM | POA: Diagnosis not present

## 2018-10-16 DIAGNOSIS — Z87891 Personal history of nicotine dependence: Secondary | ICD-10-CM | POA: Diagnosis not present

## 2018-10-16 DIAGNOSIS — I10 Essential (primary) hypertension: Secondary | ICD-10-CM | POA: Diagnosis not present

## 2018-10-16 DIAGNOSIS — Z7952 Long term (current) use of systemic steroids: Secondary | ICD-10-CM | POA: Diagnosis not present

## 2018-10-16 DIAGNOSIS — Z9181 History of falling: Secondary | ICD-10-CM | POA: Diagnosis not present

## 2018-10-16 DIAGNOSIS — J189 Pneumonia, unspecified organism: Secondary | ICD-10-CM | POA: Diagnosis not present

## 2018-10-16 DIAGNOSIS — Z7984 Long term (current) use of oral hypoglycemic drugs: Secondary | ICD-10-CM | POA: Diagnosis not present

## 2018-10-16 DIAGNOSIS — J441 Chronic obstructive pulmonary disease with (acute) exacerbation: Secondary | ICD-10-CM | POA: Diagnosis not present

## 2018-10-16 DIAGNOSIS — E039 Hypothyroidism, unspecified: Secondary | ICD-10-CM | POA: Diagnosis not present

## 2018-10-16 DIAGNOSIS — Z87440 Personal history of urinary (tract) infections: Secondary | ICD-10-CM | POA: Diagnosis not present

## 2018-10-16 DIAGNOSIS — Z7951 Long term (current) use of inhaled steroids: Secondary | ICD-10-CM | POA: Diagnosis not present

## 2018-10-18 DIAGNOSIS — Z7951 Long term (current) use of inhaled steroids: Secondary | ICD-10-CM | POA: Diagnosis not present

## 2018-10-18 DIAGNOSIS — Z9981 Dependence on supplemental oxygen: Secondary | ICD-10-CM | POA: Diagnosis not present

## 2018-10-18 DIAGNOSIS — I10 Essential (primary) hypertension: Secondary | ICD-10-CM | POA: Diagnosis not present

## 2018-10-18 DIAGNOSIS — E1142 Type 2 diabetes mellitus with diabetic polyneuropathy: Secondary | ICD-10-CM | POA: Diagnosis not present

## 2018-10-18 DIAGNOSIS — E039 Hypothyroidism, unspecified: Secondary | ICD-10-CM | POA: Diagnosis not present

## 2018-10-18 DIAGNOSIS — J440 Chronic obstructive pulmonary disease with acute lower respiratory infection: Secondary | ICD-10-CM | POA: Diagnosis not present

## 2018-10-18 DIAGNOSIS — M069 Rheumatoid arthritis, unspecified: Secondary | ICD-10-CM | POA: Diagnosis not present

## 2018-10-18 DIAGNOSIS — Z87891 Personal history of nicotine dependence: Secondary | ICD-10-CM | POA: Diagnosis not present

## 2018-10-18 DIAGNOSIS — R918 Other nonspecific abnormal finding of lung field: Secondary | ICD-10-CM | POA: Diagnosis not present

## 2018-10-18 DIAGNOSIS — E7800 Pure hypercholesterolemia, unspecified: Secondary | ICD-10-CM | POA: Diagnosis not present

## 2018-10-18 DIAGNOSIS — J441 Chronic obstructive pulmonary disease with (acute) exacerbation: Secondary | ICD-10-CM | POA: Diagnosis not present

## 2018-10-18 DIAGNOSIS — Z7952 Long term (current) use of systemic steroids: Secondary | ICD-10-CM | POA: Diagnosis not present

## 2018-10-18 DIAGNOSIS — Z9181 History of falling: Secondary | ICD-10-CM | POA: Diagnosis not present

## 2018-10-18 DIAGNOSIS — Z7984 Long term (current) use of oral hypoglycemic drugs: Secondary | ICD-10-CM | POA: Diagnosis not present

## 2018-10-18 DIAGNOSIS — Z87440 Personal history of urinary (tract) infections: Secondary | ICD-10-CM | POA: Diagnosis not present

## 2018-10-18 DIAGNOSIS — J189 Pneumonia, unspecified organism: Secondary | ICD-10-CM | POA: Diagnosis not present

## 2018-10-18 DIAGNOSIS — M1991 Primary osteoarthritis, unspecified site: Secondary | ICD-10-CM | POA: Diagnosis not present

## 2018-10-19 DIAGNOSIS — Z7951 Long term (current) use of inhaled steroids: Secondary | ICD-10-CM | POA: Diagnosis not present

## 2018-10-19 DIAGNOSIS — E039 Hypothyroidism, unspecified: Secondary | ICD-10-CM | POA: Diagnosis not present

## 2018-10-19 DIAGNOSIS — Z9981 Dependence on supplemental oxygen: Secondary | ICD-10-CM | POA: Diagnosis not present

## 2018-10-19 DIAGNOSIS — M069 Rheumatoid arthritis, unspecified: Secondary | ICD-10-CM | POA: Diagnosis not present

## 2018-10-19 DIAGNOSIS — Z87891 Personal history of nicotine dependence: Secondary | ICD-10-CM | POA: Diagnosis not present

## 2018-10-19 DIAGNOSIS — E1142 Type 2 diabetes mellitus with diabetic polyneuropathy: Secondary | ICD-10-CM | POA: Diagnosis not present

## 2018-10-19 DIAGNOSIS — J440 Chronic obstructive pulmonary disease with acute lower respiratory infection: Secondary | ICD-10-CM | POA: Diagnosis not present

## 2018-10-19 DIAGNOSIS — E7800 Pure hypercholesterolemia, unspecified: Secondary | ICD-10-CM | POA: Diagnosis not present

## 2018-10-19 DIAGNOSIS — I10 Essential (primary) hypertension: Secondary | ICD-10-CM | POA: Diagnosis not present

## 2018-10-19 DIAGNOSIS — Z87440 Personal history of urinary (tract) infections: Secondary | ICD-10-CM | POA: Diagnosis not present

## 2018-10-19 DIAGNOSIS — M1991 Primary osteoarthritis, unspecified site: Secondary | ICD-10-CM | POA: Diagnosis not present

## 2018-10-19 DIAGNOSIS — J189 Pneumonia, unspecified organism: Secondary | ICD-10-CM | POA: Diagnosis not present

## 2018-10-19 DIAGNOSIS — Z7952 Long term (current) use of systemic steroids: Secondary | ICD-10-CM | POA: Diagnosis not present

## 2018-10-19 DIAGNOSIS — Z7984 Long term (current) use of oral hypoglycemic drugs: Secondary | ICD-10-CM | POA: Diagnosis not present

## 2018-10-19 DIAGNOSIS — J441 Chronic obstructive pulmonary disease with (acute) exacerbation: Secondary | ICD-10-CM | POA: Diagnosis not present

## 2018-10-19 DIAGNOSIS — Z9181 History of falling: Secondary | ICD-10-CM | POA: Diagnosis not present

## 2018-10-20 DIAGNOSIS — Z7984 Long term (current) use of oral hypoglycemic drugs: Secondary | ICD-10-CM | POA: Diagnosis not present

## 2018-10-20 DIAGNOSIS — Z7951 Long term (current) use of inhaled steroids: Secondary | ICD-10-CM | POA: Diagnosis not present

## 2018-10-20 DIAGNOSIS — J440 Chronic obstructive pulmonary disease with acute lower respiratory infection: Secondary | ICD-10-CM | POA: Diagnosis not present

## 2018-10-20 DIAGNOSIS — Z87891 Personal history of nicotine dependence: Secondary | ICD-10-CM | POA: Diagnosis not present

## 2018-10-20 DIAGNOSIS — Z9981 Dependence on supplemental oxygen: Secondary | ICD-10-CM | POA: Diagnosis not present

## 2018-10-20 DIAGNOSIS — E1169 Type 2 diabetes mellitus with other specified complication: Secondary | ICD-10-CM | POA: Diagnosis not present

## 2018-10-20 DIAGNOSIS — E1159 Type 2 diabetes mellitus with other circulatory complications: Secondary | ICD-10-CM | POA: Diagnosis not present

## 2018-10-20 DIAGNOSIS — M1991 Primary osteoarthritis, unspecified site: Secondary | ICD-10-CM | POA: Diagnosis not present

## 2018-10-20 DIAGNOSIS — J189 Pneumonia, unspecified organism: Secondary | ICD-10-CM | POA: Diagnosis not present

## 2018-10-20 DIAGNOSIS — Z87440 Personal history of urinary (tract) infections: Secondary | ICD-10-CM | POA: Diagnosis not present

## 2018-10-20 DIAGNOSIS — J441 Chronic obstructive pulmonary disease with (acute) exacerbation: Secondary | ICD-10-CM | POA: Diagnosis not present

## 2018-10-20 DIAGNOSIS — Z9181 History of falling: Secondary | ICD-10-CM | POA: Diagnosis not present

## 2018-10-20 DIAGNOSIS — I10 Essential (primary) hypertension: Secondary | ICD-10-CM | POA: Diagnosis not present

## 2018-10-20 DIAGNOSIS — E7800 Pure hypercholesterolemia, unspecified: Secondary | ICD-10-CM | POA: Diagnosis not present

## 2018-10-20 DIAGNOSIS — R918 Other nonspecific abnormal finding of lung field: Secondary | ICD-10-CM | POA: Diagnosis not present

## 2018-10-20 DIAGNOSIS — M069 Rheumatoid arthritis, unspecified: Secondary | ICD-10-CM | POA: Diagnosis not present

## 2018-10-20 DIAGNOSIS — E1142 Type 2 diabetes mellitus with diabetic polyneuropathy: Secondary | ICD-10-CM | POA: Diagnosis not present

## 2018-10-20 DIAGNOSIS — Z7689 Persons encountering health services in other specified circumstances: Secondary | ICD-10-CM | POA: Diagnosis not present

## 2018-10-20 DIAGNOSIS — E1140 Type 2 diabetes mellitus with diabetic neuropathy, unspecified: Secondary | ICD-10-CM | POA: Diagnosis not present

## 2018-10-20 DIAGNOSIS — E039 Hypothyroidism, unspecified: Secondary | ICD-10-CM | POA: Diagnosis not present

## 2018-10-20 DIAGNOSIS — Z7952 Long term (current) use of systemic steroids: Secondary | ICD-10-CM | POA: Diagnosis not present

## 2018-10-23 DIAGNOSIS — Z87891 Personal history of nicotine dependence: Secondary | ICD-10-CM | POA: Diagnosis not present

## 2018-10-23 DIAGNOSIS — Z9981 Dependence on supplemental oxygen: Secondary | ICD-10-CM | POA: Diagnosis not present

## 2018-10-23 DIAGNOSIS — J440 Chronic obstructive pulmonary disease with acute lower respiratory infection: Secondary | ICD-10-CM | POA: Diagnosis not present

## 2018-10-23 DIAGNOSIS — J189 Pneumonia, unspecified organism: Secondary | ICD-10-CM | POA: Diagnosis not present

## 2018-10-23 DIAGNOSIS — Z7951 Long term (current) use of inhaled steroids: Secondary | ICD-10-CM | POA: Diagnosis not present

## 2018-10-23 DIAGNOSIS — E039 Hypothyroidism, unspecified: Secondary | ICD-10-CM | POA: Diagnosis not present

## 2018-10-23 DIAGNOSIS — Z9181 History of falling: Secondary | ICD-10-CM | POA: Diagnosis not present

## 2018-10-23 DIAGNOSIS — E1142 Type 2 diabetes mellitus with diabetic polyneuropathy: Secondary | ICD-10-CM | POA: Diagnosis not present

## 2018-10-23 DIAGNOSIS — Z7952 Long term (current) use of systemic steroids: Secondary | ICD-10-CM | POA: Diagnosis not present

## 2018-10-23 DIAGNOSIS — M1991 Primary osteoarthritis, unspecified site: Secondary | ICD-10-CM | POA: Diagnosis not present

## 2018-10-23 DIAGNOSIS — I10 Essential (primary) hypertension: Secondary | ICD-10-CM | POA: Diagnosis not present

## 2018-10-23 DIAGNOSIS — Z87440 Personal history of urinary (tract) infections: Secondary | ICD-10-CM | POA: Diagnosis not present

## 2018-10-23 DIAGNOSIS — J441 Chronic obstructive pulmonary disease with (acute) exacerbation: Secondary | ICD-10-CM | POA: Diagnosis not present

## 2018-10-23 DIAGNOSIS — Z7984 Long term (current) use of oral hypoglycemic drugs: Secondary | ICD-10-CM | POA: Diagnosis not present

## 2018-10-23 DIAGNOSIS — M069 Rheumatoid arthritis, unspecified: Secondary | ICD-10-CM | POA: Diagnosis not present

## 2018-10-23 DIAGNOSIS — E7800 Pure hypercholesterolemia, unspecified: Secondary | ICD-10-CM | POA: Diagnosis not present

## 2018-10-25 DIAGNOSIS — Z7952 Long term (current) use of systemic steroids: Secondary | ICD-10-CM | POA: Diagnosis not present

## 2018-10-25 DIAGNOSIS — I10 Essential (primary) hypertension: Secondary | ICD-10-CM | POA: Diagnosis not present

## 2018-10-25 DIAGNOSIS — M1991 Primary osteoarthritis, unspecified site: Secondary | ICD-10-CM | POA: Diagnosis not present

## 2018-10-25 DIAGNOSIS — J189 Pneumonia, unspecified organism: Secondary | ICD-10-CM | POA: Diagnosis not present

## 2018-10-25 DIAGNOSIS — E1142 Type 2 diabetes mellitus with diabetic polyneuropathy: Secondary | ICD-10-CM | POA: Diagnosis not present

## 2018-10-25 DIAGNOSIS — J440 Chronic obstructive pulmonary disease with acute lower respiratory infection: Secondary | ICD-10-CM | POA: Diagnosis not present

## 2018-10-25 DIAGNOSIS — Z9981 Dependence on supplemental oxygen: Secondary | ICD-10-CM | POA: Diagnosis not present

## 2018-10-25 DIAGNOSIS — Z7984 Long term (current) use of oral hypoglycemic drugs: Secondary | ICD-10-CM | POA: Diagnosis not present

## 2018-10-25 DIAGNOSIS — Z87891 Personal history of nicotine dependence: Secondary | ICD-10-CM | POA: Diagnosis not present

## 2018-10-25 DIAGNOSIS — M069 Rheumatoid arthritis, unspecified: Secondary | ICD-10-CM | POA: Diagnosis not present

## 2018-10-25 DIAGNOSIS — E7800 Pure hypercholesterolemia, unspecified: Secondary | ICD-10-CM | POA: Diagnosis not present

## 2018-10-25 DIAGNOSIS — E039 Hypothyroidism, unspecified: Secondary | ICD-10-CM | POA: Diagnosis not present

## 2018-10-25 DIAGNOSIS — J441 Chronic obstructive pulmonary disease with (acute) exacerbation: Secondary | ICD-10-CM | POA: Diagnosis not present

## 2018-10-25 DIAGNOSIS — Z9181 History of falling: Secondary | ICD-10-CM | POA: Diagnosis not present

## 2018-10-25 DIAGNOSIS — Z87440 Personal history of urinary (tract) infections: Secondary | ICD-10-CM | POA: Diagnosis not present

## 2018-10-25 DIAGNOSIS — Z7951 Long term (current) use of inhaled steroids: Secondary | ICD-10-CM | POA: Diagnosis not present

## 2018-10-26 DIAGNOSIS — I10 Essential (primary) hypertension: Secondary | ICD-10-CM | POA: Diagnosis not present

## 2018-10-26 DIAGNOSIS — J440 Chronic obstructive pulmonary disease with acute lower respiratory infection: Secondary | ICD-10-CM | POA: Diagnosis not present

## 2018-10-26 DIAGNOSIS — Z7952 Long term (current) use of systemic steroids: Secondary | ICD-10-CM | POA: Diagnosis not present

## 2018-10-26 DIAGNOSIS — Z9181 History of falling: Secondary | ICD-10-CM | POA: Diagnosis not present

## 2018-10-26 DIAGNOSIS — J189 Pneumonia, unspecified organism: Secondary | ICD-10-CM | POA: Diagnosis not present

## 2018-10-26 DIAGNOSIS — E039 Hypothyroidism, unspecified: Secondary | ICD-10-CM | POA: Diagnosis not present

## 2018-10-26 DIAGNOSIS — Z9981 Dependence on supplemental oxygen: Secondary | ICD-10-CM | POA: Diagnosis not present

## 2018-10-26 DIAGNOSIS — E7800 Pure hypercholesterolemia, unspecified: Secondary | ICD-10-CM | POA: Diagnosis not present

## 2018-10-26 DIAGNOSIS — Z87440 Personal history of urinary (tract) infections: Secondary | ICD-10-CM | POA: Diagnosis not present

## 2018-10-26 DIAGNOSIS — E1142 Type 2 diabetes mellitus with diabetic polyneuropathy: Secondary | ICD-10-CM | POA: Diagnosis not present

## 2018-10-26 DIAGNOSIS — Z7951 Long term (current) use of inhaled steroids: Secondary | ICD-10-CM | POA: Diagnosis not present

## 2018-10-26 DIAGNOSIS — M069 Rheumatoid arthritis, unspecified: Secondary | ICD-10-CM | POA: Diagnosis not present

## 2018-10-26 DIAGNOSIS — Z87891 Personal history of nicotine dependence: Secondary | ICD-10-CM | POA: Diagnosis not present

## 2018-10-26 DIAGNOSIS — M1991 Primary osteoarthritis, unspecified site: Secondary | ICD-10-CM | POA: Diagnosis not present

## 2018-10-26 DIAGNOSIS — J441 Chronic obstructive pulmonary disease with (acute) exacerbation: Secondary | ICD-10-CM | POA: Diagnosis not present

## 2018-10-26 DIAGNOSIS — Z7984 Long term (current) use of oral hypoglycemic drugs: Secondary | ICD-10-CM | POA: Diagnosis not present

## 2018-10-27 DIAGNOSIS — I10 Essential (primary) hypertension: Secondary | ICD-10-CM | POA: Diagnosis not present

## 2018-10-27 DIAGNOSIS — R590 Localized enlarged lymph nodes: Secondary | ICD-10-CM | POA: Diagnosis not present

## 2018-10-27 DIAGNOSIS — J159 Unspecified bacterial pneumonia: Secondary | ICD-10-CM | POA: Diagnosis not present

## 2018-10-27 DIAGNOSIS — E1159 Type 2 diabetes mellitus with other circulatory complications: Secondary | ICD-10-CM | POA: Diagnosis not present

## 2018-10-30 DIAGNOSIS — Z9981 Dependence on supplemental oxygen: Secondary | ICD-10-CM | POA: Diagnosis not present

## 2018-10-30 DIAGNOSIS — I10 Essential (primary) hypertension: Secondary | ICD-10-CM | POA: Diagnosis not present

## 2018-10-30 DIAGNOSIS — M069 Rheumatoid arthritis, unspecified: Secondary | ICD-10-CM | POA: Diagnosis not present

## 2018-10-30 DIAGNOSIS — E7800 Pure hypercholesterolemia, unspecified: Secondary | ICD-10-CM | POA: Diagnosis not present

## 2018-10-30 DIAGNOSIS — Z7952 Long term (current) use of systemic steroids: Secondary | ICD-10-CM | POA: Diagnosis not present

## 2018-10-30 DIAGNOSIS — E039 Hypothyroidism, unspecified: Secondary | ICD-10-CM | POA: Diagnosis not present

## 2018-10-30 DIAGNOSIS — J441 Chronic obstructive pulmonary disease with (acute) exacerbation: Secondary | ICD-10-CM | POA: Diagnosis not present

## 2018-10-30 DIAGNOSIS — Z87891 Personal history of nicotine dependence: Secondary | ICD-10-CM | POA: Diagnosis not present

## 2018-10-30 DIAGNOSIS — Z7951 Long term (current) use of inhaled steroids: Secondary | ICD-10-CM | POA: Diagnosis not present

## 2018-10-30 DIAGNOSIS — J189 Pneumonia, unspecified organism: Secondary | ICD-10-CM | POA: Diagnosis not present

## 2018-10-30 DIAGNOSIS — Z87440 Personal history of urinary (tract) infections: Secondary | ICD-10-CM | POA: Diagnosis not present

## 2018-10-30 DIAGNOSIS — Z7984 Long term (current) use of oral hypoglycemic drugs: Secondary | ICD-10-CM | POA: Diagnosis not present

## 2018-10-30 DIAGNOSIS — E1142 Type 2 diabetes mellitus with diabetic polyneuropathy: Secondary | ICD-10-CM | POA: Diagnosis not present

## 2018-10-30 DIAGNOSIS — J440 Chronic obstructive pulmonary disease with acute lower respiratory infection: Secondary | ICD-10-CM | POA: Diagnosis not present

## 2018-10-30 DIAGNOSIS — M1991 Primary osteoarthritis, unspecified site: Secondary | ICD-10-CM | POA: Diagnosis not present

## 2018-10-30 DIAGNOSIS — Z9181 History of falling: Secondary | ICD-10-CM | POA: Diagnosis not present

## 2018-11-01 DIAGNOSIS — J440 Chronic obstructive pulmonary disease with acute lower respiratory infection: Secondary | ICD-10-CM | POA: Diagnosis not present

## 2018-11-01 DIAGNOSIS — Z7984 Long term (current) use of oral hypoglycemic drugs: Secondary | ICD-10-CM | POA: Diagnosis not present

## 2018-11-01 DIAGNOSIS — Z7952 Long term (current) use of systemic steroids: Secondary | ICD-10-CM | POA: Diagnosis not present

## 2018-11-01 DIAGNOSIS — Z9181 History of falling: Secondary | ICD-10-CM | POA: Diagnosis not present

## 2018-11-01 DIAGNOSIS — E7800 Pure hypercholesterolemia, unspecified: Secondary | ICD-10-CM | POA: Diagnosis not present

## 2018-11-01 DIAGNOSIS — M069 Rheumatoid arthritis, unspecified: Secondary | ICD-10-CM | POA: Diagnosis not present

## 2018-11-01 DIAGNOSIS — Z9981 Dependence on supplemental oxygen: Secondary | ICD-10-CM | POA: Diagnosis not present

## 2018-11-01 DIAGNOSIS — I10 Essential (primary) hypertension: Secondary | ICD-10-CM | POA: Diagnosis not present

## 2018-11-01 DIAGNOSIS — Z87891 Personal history of nicotine dependence: Secondary | ICD-10-CM | POA: Diagnosis not present

## 2018-11-01 DIAGNOSIS — M1991 Primary osteoarthritis, unspecified site: Secondary | ICD-10-CM | POA: Diagnosis not present

## 2018-11-01 DIAGNOSIS — J189 Pneumonia, unspecified organism: Secondary | ICD-10-CM | POA: Diagnosis not present

## 2018-11-01 DIAGNOSIS — Z87440 Personal history of urinary (tract) infections: Secondary | ICD-10-CM | POA: Diagnosis not present

## 2018-11-01 DIAGNOSIS — E039 Hypothyroidism, unspecified: Secondary | ICD-10-CM | POA: Diagnosis not present

## 2018-11-01 DIAGNOSIS — J441 Chronic obstructive pulmonary disease with (acute) exacerbation: Secondary | ICD-10-CM | POA: Diagnosis not present

## 2018-11-01 DIAGNOSIS — E1142 Type 2 diabetes mellitus with diabetic polyneuropathy: Secondary | ICD-10-CM | POA: Diagnosis not present

## 2018-11-01 DIAGNOSIS — Z7951 Long term (current) use of inhaled steroids: Secondary | ICD-10-CM | POA: Diagnosis not present

## 2018-11-08 DIAGNOSIS — I10 Essential (primary) hypertension: Secondary | ICD-10-CM | POA: Diagnosis not present

## 2018-11-08 DIAGNOSIS — E039 Hypothyroidism, unspecified: Secondary | ICD-10-CM | POA: Diagnosis not present

## 2018-11-08 DIAGNOSIS — Z9181 History of falling: Secondary | ICD-10-CM | POA: Diagnosis not present

## 2018-11-08 DIAGNOSIS — M069 Rheumatoid arthritis, unspecified: Secondary | ICD-10-CM | POA: Diagnosis not present

## 2018-11-08 DIAGNOSIS — J441 Chronic obstructive pulmonary disease with (acute) exacerbation: Secondary | ICD-10-CM | POA: Diagnosis not present

## 2018-11-08 DIAGNOSIS — J189 Pneumonia, unspecified organism: Secondary | ICD-10-CM | POA: Diagnosis not present

## 2018-11-08 DIAGNOSIS — Z9981 Dependence on supplemental oxygen: Secondary | ICD-10-CM | POA: Diagnosis not present

## 2018-11-08 DIAGNOSIS — Z87891 Personal history of nicotine dependence: Secondary | ICD-10-CM | POA: Diagnosis not present

## 2018-11-08 DIAGNOSIS — Z7984 Long term (current) use of oral hypoglycemic drugs: Secondary | ICD-10-CM | POA: Diagnosis not present

## 2018-11-08 DIAGNOSIS — Z7952 Long term (current) use of systemic steroids: Secondary | ICD-10-CM | POA: Diagnosis not present

## 2018-11-08 DIAGNOSIS — J440 Chronic obstructive pulmonary disease with acute lower respiratory infection: Secondary | ICD-10-CM | POA: Diagnosis not present

## 2018-11-08 DIAGNOSIS — E7800 Pure hypercholesterolemia, unspecified: Secondary | ICD-10-CM | POA: Diagnosis not present

## 2018-11-08 DIAGNOSIS — Z87440 Personal history of urinary (tract) infections: Secondary | ICD-10-CM | POA: Diagnosis not present

## 2018-11-08 DIAGNOSIS — M1991 Primary osteoarthritis, unspecified site: Secondary | ICD-10-CM | POA: Diagnosis not present

## 2018-11-08 DIAGNOSIS — Z7951 Long term (current) use of inhaled steroids: Secondary | ICD-10-CM | POA: Diagnosis not present

## 2018-11-08 DIAGNOSIS — E1142 Type 2 diabetes mellitus with diabetic polyneuropathy: Secondary | ICD-10-CM | POA: Diagnosis not present

## 2018-11-10 DIAGNOSIS — Z9181 History of falling: Secondary | ICD-10-CM | POA: Diagnosis not present

## 2018-11-10 DIAGNOSIS — Z87440 Personal history of urinary (tract) infections: Secondary | ICD-10-CM | POA: Diagnosis not present

## 2018-11-10 DIAGNOSIS — Z7951 Long term (current) use of inhaled steroids: Secondary | ICD-10-CM | POA: Diagnosis not present

## 2018-11-10 DIAGNOSIS — J440 Chronic obstructive pulmonary disease with acute lower respiratory infection: Secondary | ICD-10-CM | POA: Diagnosis not present

## 2018-11-10 DIAGNOSIS — M069 Rheumatoid arthritis, unspecified: Secondary | ICD-10-CM | POA: Diagnosis not present

## 2018-11-10 DIAGNOSIS — J441 Chronic obstructive pulmonary disease with (acute) exacerbation: Secondary | ICD-10-CM | POA: Diagnosis not present

## 2018-11-10 DIAGNOSIS — E1142 Type 2 diabetes mellitus with diabetic polyneuropathy: Secondary | ICD-10-CM | POA: Diagnosis not present

## 2018-11-10 DIAGNOSIS — E039 Hypothyroidism, unspecified: Secondary | ICD-10-CM | POA: Diagnosis not present

## 2018-11-10 DIAGNOSIS — E7800 Pure hypercholesterolemia, unspecified: Secondary | ICD-10-CM | POA: Diagnosis not present

## 2018-11-10 DIAGNOSIS — J189 Pneumonia, unspecified organism: Secondary | ICD-10-CM | POA: Diagnosis not present

## 2018-11-10 DIAGNOSIS — Z87891 Personal history of nicotine dependence: Secondary | ICD-10-CM | POA: Diagnosis not present

## 2018-11-10 DIAGNOSIS — Z9981 Dependence on supplemental oxygen: Secondary | ICD-10-CM | POA: Diagnosis not present

## 2018-11-10 DIAGNOSIS — Z7952 Long term (current) use of systemic steroids: Secondary | ICD-10-CM | POA: Diagnosis not present

## 2018-11-10 DIAGNOSIS — I10 Essential (primary) hypertension: Secondary | ICD-10-CM | POA: Diagnosis not present

## 2018-11-10 DIAGNOSIS — Z7984 Long term (current) use of oral hypoglycemic drugs: Secondary | ICD-10-CM | POA: Diagnosis not present

## 2018-11-10 DIAGNOSIS — M1991 Primary osteoarthritis, unspecified site: Secondary | ICD-10-CM | POA: Diagnosis not present

## 2018-11-13 DIAGNOSIS — Z87891 Personal history of nicotine dependence: Secondary | ICD-10-CM | POA: Diagnosis not present

## 2018-11-13 DIAGNOSIS — E1142 Type 2 diabetes mellitus with diabetic polyneuropathy: Secondary | ICD-10-CM | POA: Diagnosis not present

## 2018-11-13 DIAGNOSIS — E7800 Pure hypercholesterolemia, unspecified: Secondary | ICD-10-CM | POA: Diagnosis not present

## 2018-11-13 DIAGNOSIS — Z7951 Long term (current) use of inhaled steroids: Secondary | ICD-10-CM | POA: Diagnosis not present

## 2018-11-13 DIAGNOSIS — I10 Essential (primary) hypertension: Secondary | ICD-10-CM | POA: Diagnosis not present

## 2018-11-13 DIAGNOSIS — J440 Chronic obstructive pulmonary disease with acute lower respiratory infection: Secondary | ICD-10-CM | POA: Diagnosis not present

## 2018-11-13 DIAGNOSIS — Z9181 History of falling: Secondary | ICD-10-CM | POA: Diagnosis not present

## 2018-11-13 DIAGNOSIS — Z87440 Personal history of urinary (tract) infections: Secondary | ICD-10-CM | POA: Diagnosis not present

## 2018-11-13 DIAGNOSIS — E039 Hypothyroidism, unspecified: Secondary | ICD-10-CM | POA: Diagnosis not present

## 2018-11-13 DIAGNOSIS — Z7984 Long term (current) use of oral hypoglycemic drugs: Secondary | ICD-10-CM | POA: Diagnosis not present

## 2018-11-13 DIAGNOSIS — J441 Chronic obstructive pulmonary disease with (acute) exacerbation: Secondary | ICD-10-CM | POA: Diagnosis not present

## 2018-11-13 DIAGNOSIS — Z7952 Long term (current) use of systemic steroids: Secondary | ICD-10-CM | POA: Diagnosis not present

## 2018-11-13 DIAGNOSIS — J189 Pneumonia, unspecified organism: Secondary | ICD-10-CM | POA: Diagnosis not present

## 2018-11-13 DIAGNOSIS — Z9981 Dependence on supplemental oxygen: Secondary | ICD-10-CM | POA: Diagnosis not present

## 2018-11-13 DIAGNOSIS — M1991 Primary osteoarthritis, unspecified site: Secondary | ICD-10-CM | POA: Diagnosis not present

## 2018-11-13 DIAGNOSIS — M069 Rheumatoid arthritis, unspecified: Secondary | ICD-10-CM | POA: Diagnosis not present

## 2018-11-16 DIAGNOSIS — R599 Enlarged lymph nodes, unspecified: Secondary | ICD-10-CM | POA: Diagnosis not present

## 2018-11-16 DIAGNOSIS — R590 Localized enlarged lymph nodes: Secondary | ICD-10-CM | POA: Diagnosis not present

## 2018-11-16 DIAGNOSIS — J90 Pleural effusion, not elsewhere classified: Secondary | ICD-10-CM | POA: Diagnosis not present

## 2018-11-16 DIAGNOSIS — Z8709 Personal history of other diseases of the respiratory system: Secondary | ICD-10-CM | POA: Diagnosis not present

## 2018-11-16 DIAGNOSIS — I700 Atherosclerosis of aorta: Secondary | ICD-10-CM | POA: Diagnosis not present

## 2018-11-17 DIAGNOSIS — Z9181 History of falling: Secondary | ICD-10-CM | POA: Diagnosis not present

## 2018-11-17 DIAGNOSIS — E7800 Pure hypercholesterolemia, unspecified: Secondary | ICD-10-CM | POA: Diagnosis not present

## 2018-11-17 DIAGNOSIS — Z9981 Dependence on supplemental oxygen: Secondary | ICD-10-CM | POA: Diagnosis not present

## 2018-11-17 DIAGNOSIS — Z7984 Long term (current) use of oral hypoglycemic drugs: Secondary | ICD-10-CM | POA: Diagnosis not present

## 2018-11-17 DIAGNOSIS — J441 Chronic obstructive pulmonary disease with (acute) exacerbation: Secondary | ICD-10-CM | POA: Diagnosis not present

## 2018-11-17 DIAGNOSIS — E1142 Type 2 diabetes mellitus with diabetic polyneuropathy: Secondary | ICD-10-CM | POA: Diagnosis not present

## 2018-11-17 DIAGNOSIS — E039 Hypothyroidism, unspecified: Secondary | ICD-10-CM | POA: Diagnosis not present

## 2018-11-17 DIAGNOSIS — I10 Essential (primary) hypertension: Secondary | ICD-10-CM | POA: Diagnosis not present

## 2018-11-17 DIAGNOSIS — Z87440 Personal history of urinary (tract) infections: Secondary | ICD-10-CM | POA: Diagnosis not present

## 2018-11-17 DIAGNOSIS — Z7952 Long term (current) use of systemic steroids: Secondary | ICD-10-CM | POA: Diagnosis not present

## 2018-11-17 DIAGNOSIS — M1991 Primary osteoarthritis, unspecified site: Secondary | ICD-10-CM | POA: Diagnosis not present

## 2018-11-17 DIAGNOSIS — J189 Pneumonia, unspecified organism: Secondary | ICD-10-CM | POA: Diagnosis not present

## 2018-11-17 DIAGNOSIS — M069 Rheumatoid arthritis, unspecified: Secondary | ICD-10-CM | POA: Diagnosis not present

## 2018-11-17 DIAGNOSIS — J440 Chronic obstructive pulmonary disease with acute lower respiratory infection: Secondary | ICD-10-CM | POA: Diagnosis not present

## 2018-11-17 DIAGNOSIS — Z87891 Personal history of nicotine dependence: Secondary | ICD-10-CM | POA: Diagnosis not present

## 2018-11-17 DIAGNOSIS — Z7951 Long term (current) use of inhaled steroids: Secondary | ICD-10-CM | POA: Diagnosis not present

## 2018-11-24 DIAGNOSIS — J189 Pneumonia, unspecified organism: Secondary | ICD-10-CM | POA: Diagnosis not present

## 2018-11-24 DIAGNOSIS — R590 Localized enlarged lymph nodes: Secondary | ICD-10-CM | POA: Diagnosis not present

## 2019-01-20 IMAGING — DX DG CHEST 2V
2 series · 2 of 2 positions shown · non-contrast
Comparison: Chest radiograph June 12, 2016

CLINICAL DATA: Shortness of breath, cough. On home oxygen. History
of COPD.

EXAM:
CHEST - 2 VIEW

[chest lat]
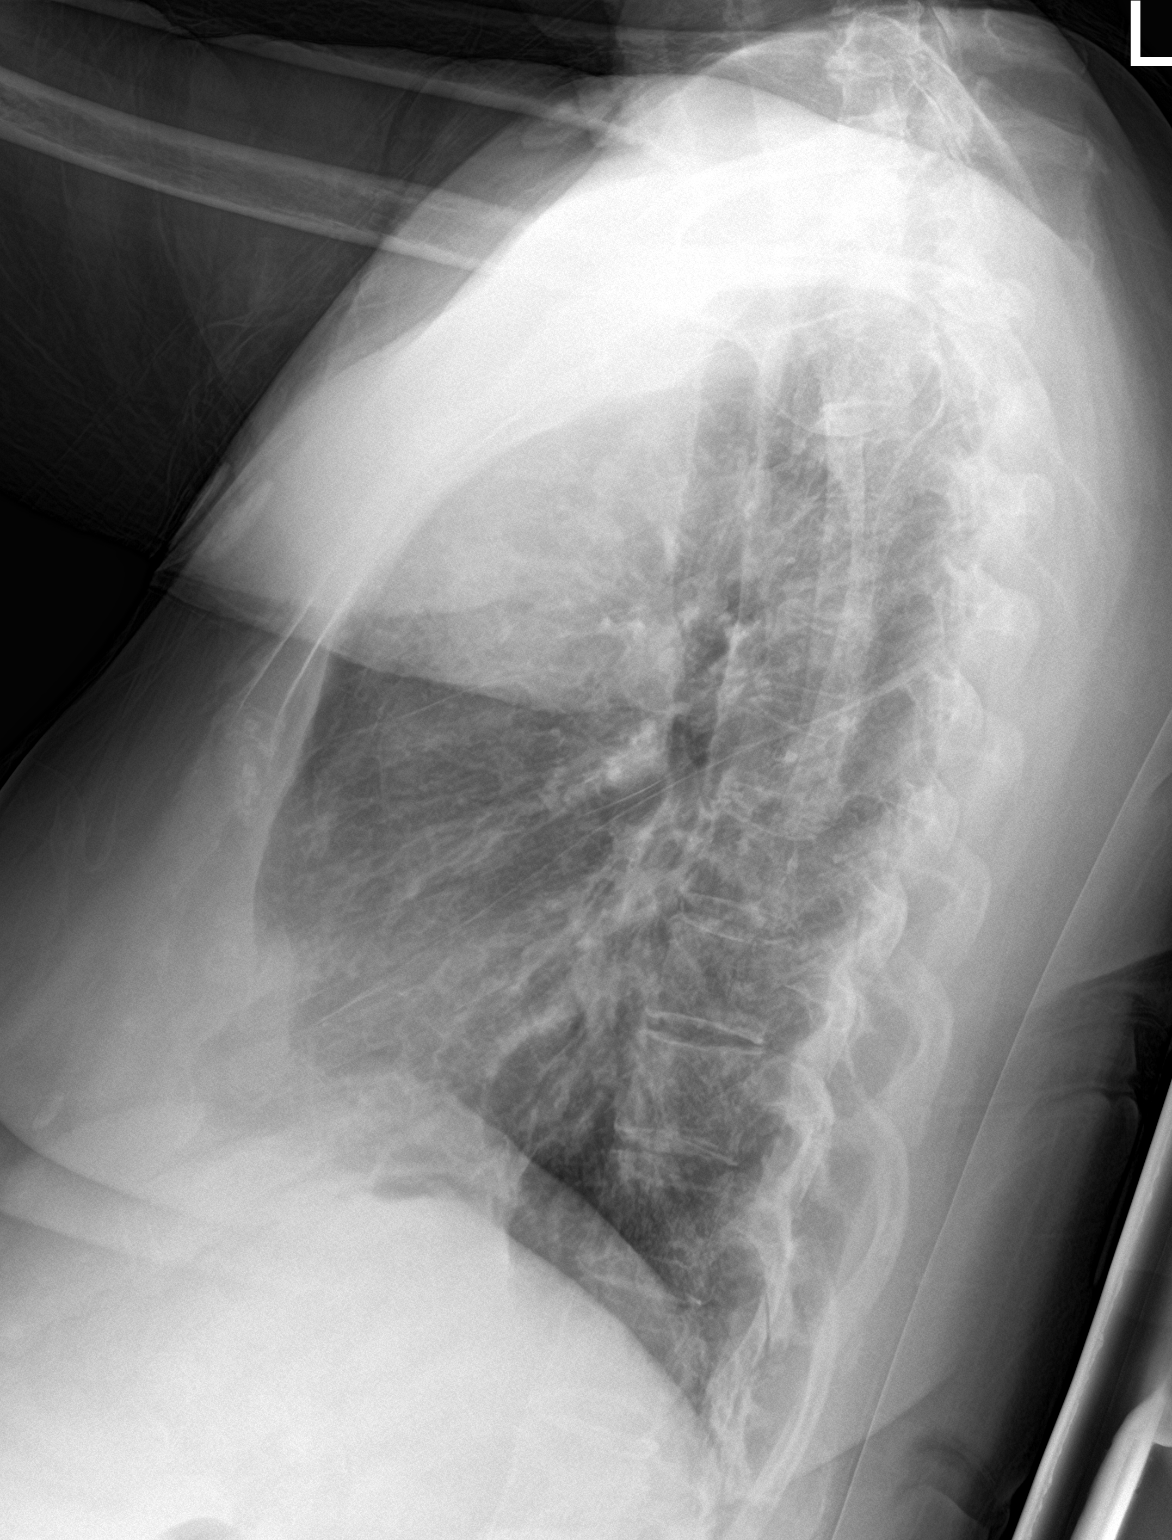

[chest ap]
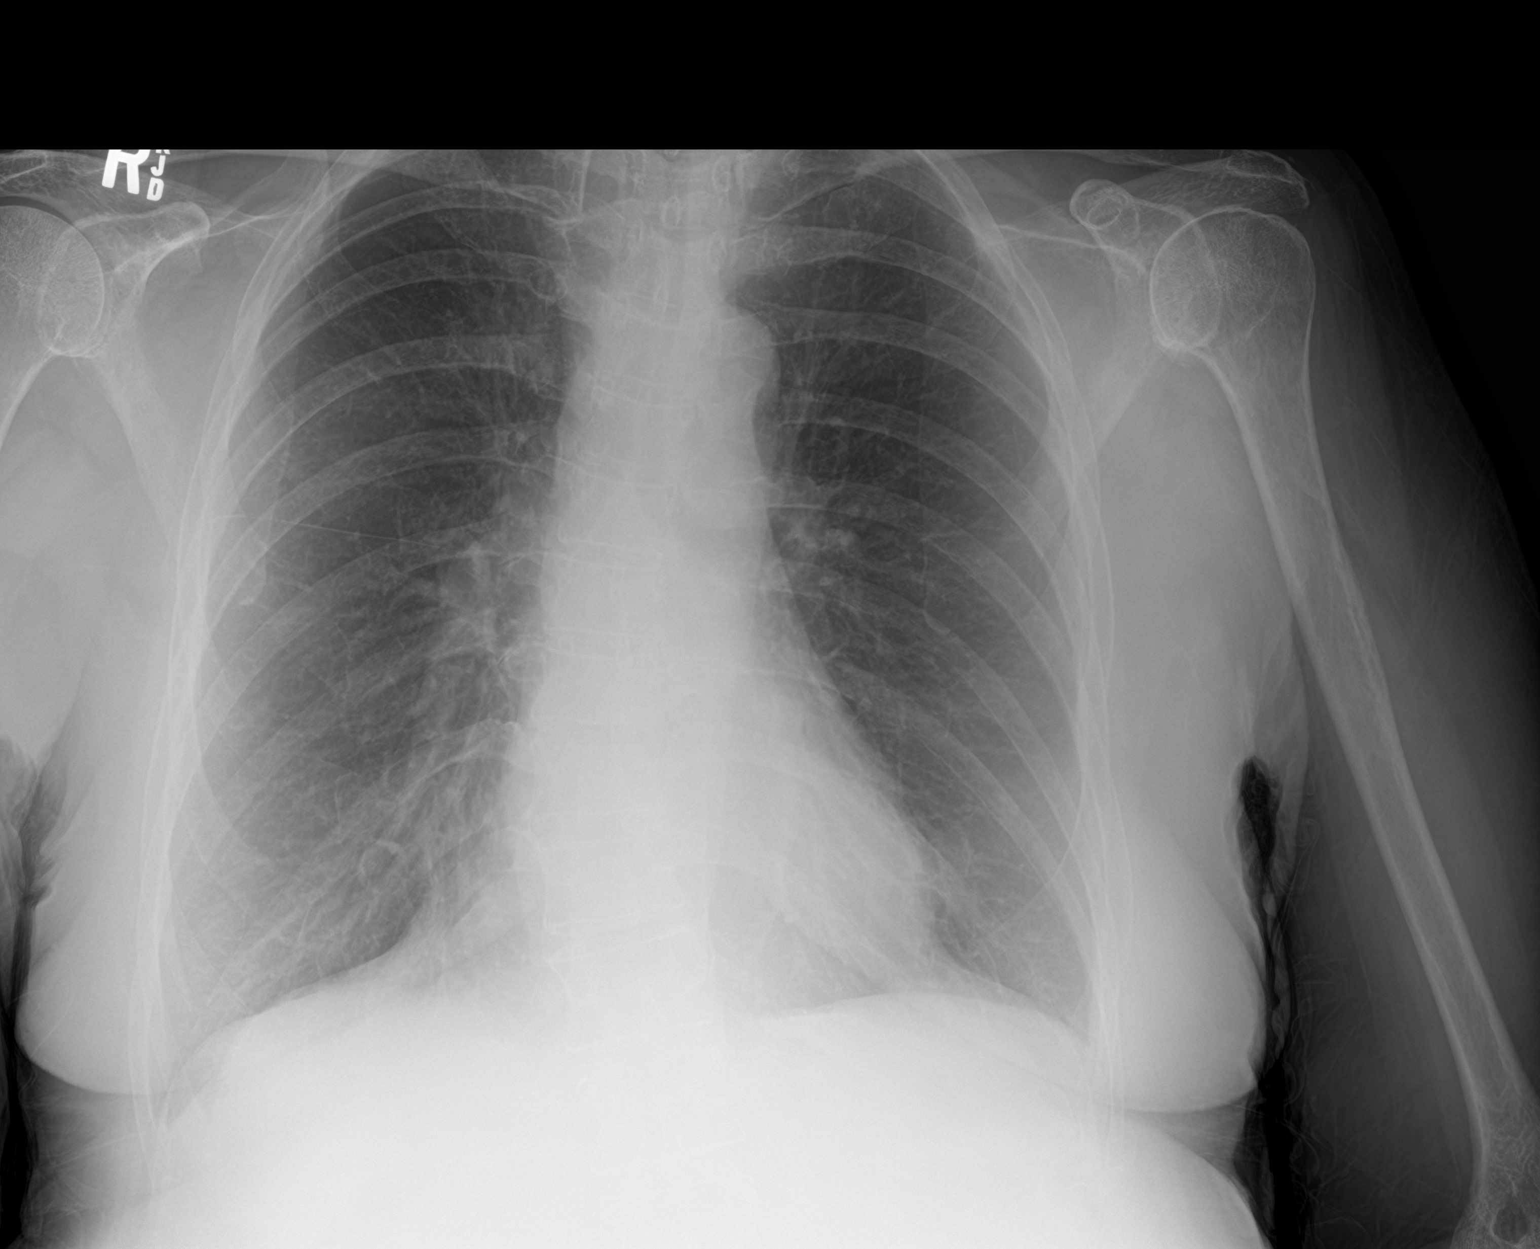

[2 of 2 positions shown; findings below may reference images not displayed]

FINDINGS: Cardiomediastinal silhouette is normal. No pleural effusions or
focal consolidations. Hyperinflation. Trachea projects midline and
there is no pneumothorax. Soft tissue planes and included osseous
structures are non-suspicious. Lower thoracic dextroscoliosis.
IMPRESSION: Hyperinflation without focal consolidation.

## 2019-02-19 DIAGNOSIS — E1169 Type 2 diabetes mellitus with other specified complication: Secondary | ICD-10-CM | POA: Diagnosis not present

## 2019-02-19 DIAGNOSIS — H6120 Impacted cerumen, unspecified ear: Secondary | ICD-10-CM | POA: Diagnosis not present

## 2019-02-19 DIAGNOSIS — N390 Urinary tract infection, site not specified: Secondary | ICD-10-CM | POA: Diagnosis not present

## 2019-02-19 DIAGNOSIS — Z23 Encounter for immunization: Secondary | ICD-10-CM | POA: Diagnosis not present

## 2019-02-19 DIAGNOSIS — H9193 Unspecified hearing loss, bilateral: Secondary | ICD-10-CM | POA: Diagnosis not present

## 2019-02-26 DIAGNOSIS — E1169 Type 2 diabetes mellitus with other specified complication: Secondary | ICD-10-CM | POA: Diagnosis not present

## 2019-02-26 DIAGNOSIS — E1159 Type 2 diabetes mellitus with other circulatory complications: Secondary | ICD-10-CM | POA: Diagnosis not present

## 2019-02-26 DIAGNOSIS — E1140 Type 2 diabetes mellitus with diabetic neuropathy, unspecified: Secondary | ICD-10-CM | POA: Diagnosis not present

## 2019-02-26 DIAGNOSIS — I10 Essential (primary) hypertension: Secondary | ICD-10-CM | POA: Diagnosis not present

## 2019-03-07 ENCOUNTER — Other Ambulatory Visit: Payer: Self-pay

## 2019-03-07 ENCOUNTER — Emergency Department (HOSPITAL_COMMUNITY): Payer: Medicare Other

## 2019-03-07 ENCOUNTER — Inpatient Hospital Stay (HOSPITAL_COMMUNITY)
Admission: EM | Admit: 2019-03-07 | Discharge: 2019-03-12 | DRG: 871 | Disposition: A | Discharge status: Home Health | Payer: Medicare Other | Attending: Internal Medicine | Admitting: Internal Medicine

## 2019-03-07 DIAGNOSIS — G9341 Metabolic encephalopathy: Secondary | ICD-10-CM | POA: Diagnosis present

## 2019-03-07 DIAGNOSIS — J441 Chronic obstructive pulmonary disease with (acute) exacerbation: Secondary | ICD-10-CM | POA: Diagnosis present

## 2019-03-07 DIAGNOSIS — Z7984 Long term (current) use of oral hypoglycemic drugs: Secondary | ICD-10-CM | POA: Diagnosis not present

## 2019-03-07 DIAGNOSIS — A419 Sepsis, unspecified organism: Principal | ICD-10-CM | POA: Diagnosis present

## 2019-03-07 DIAGNOSIS — Z79890 Hormone replacement therapy (postmenopausal): Secondary | ICD-10-CM

## 2019-03-07 DIAGNOSIS — E785 Hyperlipidemia, unspecified: Secondary | ICD-10-CM | POA: Diagnosis present

## 2019-03-07 DIAGNOSIS — Z7951 Long term (current) use of inhaled steroids: Secondary | ICD-10-CM | POA: Diagnosis not present

## 2019-03-07 DIAGNOSIS — N179 Acute kidney failure, unspecified: Secondary | ICD-10-CM

## 2019-03-07 DIAGNOSIS — Z20828 Contact with and (suspected) exposure to other viral communicable diseases: Secondary | ICD-10-CM | POA: Diagnosis present

## 2019-03-07 DIAGNOSIS — I959 Hypotension, unspecified: Secondary | ICD-10-CM | POA: Diagnosis not present

## 2019-03-07 DIAGNOSIS — E611 Iron deficiency: Secondary | ICD-10-CM | POA: Diagnosis present

## 2019-03-07 DIAGNOSIS — F419 Anxiety disorder, unspecified: Secondary | ICD-10-CM | POA: Diagnosis present

## 2019-03-07 DIAGNOSIS — Z87891 Personal history of nicotine dependence: Secondary | ICD-10-CM | POA: Diagnosis not present

## 2019-03-07 DIAGNOSIS — E119 Type 2 diabetes mellitus without complications: Secondary | ICD-10-CM | POA: Diagnosis present

## 2019-03-07 DIAGNOSIS — E872 Acidosis: Secondary | ICD-10-CM | POA: Diagnosis not present

## 2019-03-07 DIAGNOSIS — Z8701 Personal history of pneumonia (recurrent): Secondary | ICD-10-CM | POA: Diagnosis not present

## 2019-03-07 DIAGNOSIS — Z79899 Other long term (current) drug therapy: Secondary | ICD-10-CM

## 2019-03-07 DIAGNOSIS — F209 Schizophrenia, unspecified: Secondary | ICD-10-CM | POA: Diagnosis present

## 2019-03-07 DIAGNOSIS — E039 Hypothyroidism, unspecified: Secondary | ICD-10-CM | POA: Diagnosis present

## 2019-03-07 DIAGNOSIS — D649 Anemia, unspecified: Secondary | ICD-10-CM | POA: Diagnosis present

## 2019-03-07 DIAGNOSIS — R0902 Hypoxemia: Secondary | ICD-10-CM | POA: Diagnosis not present

## 2019-03-07 DIAGNOSIS — J449 Chronic obstructive pulmonary disease, unspecified: Secondary | ICD-10-CM | POA: Diagnosis not present

## 2019-03-07 DIAGNOSIS — N390 Urinary tract infection, site not specified: Secondary | ICD-10-CM | POA: Diagnosis not present

## 2019-03-07 DIAGNOSIS — M0579 Rheumatoid arthritis with rheumatoid factor of multiple sites without organ or systems involvement: Secondary | ICD-10-CM | POA: Diagnosis not present

## 2019-03-07 DIAGNOSIS — R404 Transient alteration of awareness: Secondary | ICD-10-CM | POA: Diagnosis not present

## 2019-03-07 DIAGNOSIS — R062 Wheezing: Secondary | ICD-10-CM

## 2019-03-07 DIAGNOSIS — J189 Pneumonia, unspecified organism: Secondary | ICD-10-CM | POA: Diagnosis not present

## 2019-03-07 DIAGNOSIS — I5032 Chronic diastolic (congestive) heart failure: Secondary | ICD-10-CM | POA: Diagnosis present

## 2019-03-07 DIAGNOSIS — I110 Hypertensive heart disease with heart failure: Secondary | ICD-10-CM | POA: Diagnosis present

## 2019-03-07 DIAGNOSIS — J9621 Acute and chronic respiratory failure with hypoxia: Secondary | ICD-10-CM

## 2019-03-07 DIAGNOSIS — R0689 Other abnormalities of breathing: Secondary | ICD-10-CM | POA: Diagnosis not present

## 2019-03-07 DIAGNOSIS — R0789 Other chest pain: Secondary | ICD-10-CM | POA: Diagnosis not present

## 2019-03-07 DIAGNOSIS — R0602 Shortness of breath: Secondary | ICD-10-CM | POA: Diagnosis not present

## 2019-03-07 LAB — COMPREHENSIVE METABOLIC PANEL
ALT: 11 U/L (ref 0–44)
AST: 23 U/L (ref 15–41)
Albumin: 2.7 g/dL — ABNORMAL LOW (ref 3.5–5.0)
Alkaline Phosphatase: 57 U/L (ref 38–126)
Anion gap: 10 (ref 5–15)
BUN: 21 mg/dL (ref 8–23)
CO2: 21 mmol/L — ABNORMAL LOW (ref 22–32)
Calcium: 8.2 mg/dL — ABNORMAL LOW (ref 8.9–10.3)
Chloride: 104 mmol/L (ref 98–111)
Creatinine, Ser: 1.43 mg/dL — ABNORMAL HIGH (ref 0.44–1.00)
GFR calc Af Amer: 42 mL/min — ABNORMAL LOW (ref >60)
GFR calc non Af Amer: 36 mL/min — ABNORMAL LOW (ref >60)
Glucose, Bld: 150 mg/dL — ABNORMAL HIGH (ref 70–99)
Potassium: 4 mmol/L (ref 3.5–5.1)
Sodium: 135 mmol/L (ref 135–145)
Total Bilirubin: 0.4 mg/dL (ref 0.3–1.2)
Total Protein: 5.3 g/dL — ABNORMAL LOW (ref 6.5–8.1)

## 2019-03-07 LAB — CBC WITH DIFFERENTIAL/PLATELET
Abs Immature Granulocytes: 0.14 10*3/uL — ABNORMAL HIGH (ref 0.00–0.07)
Basophils Absolute: 0 10*3/uL (ref 0.0–0.1)
Basophils Relative: 0 %
Eosinophils Absolute: 0 10*3/uL (ref 0.0–0.5)
Eosinophils Relative: 0 %
HCT: 28.7 % — ABNORMAL LOW (ref 36.0–46.0)
Hemoglobin: 8.8 g/dL — ABNORMAL LOW (ref 12.0–15.0)
Immature Granulocytes: 1 %
Lymphocytes Relative: 2 %
Lymphs Abs: 0.4 10*3/uL — ABNORMAL LOW (ref 0.7–4.0)
MCH: 26.4 pg (ref 26.0–34.0)
MCHC: 30.7 g/dL (ref 30.0–36.0)
MCV: 86.2 fL (ref 80.0–100.0)
Monocytes Absolute: 1.1 10*3/uL — ABNORMAL HIGH (ref 0.1–1.0)
Monocytes Relative: 6 %
Neutro Abs: 16.2 10*3/uL — ABNORMAL HIGH (ref 1.7–7.7)
Neutrophils Relative %: 91 %
Platelets: 230 10*3/uL (ref 150–400)
RBC: 3.33 MIL/uL — ABNORMAL LOW (ref 3.87–5.11)
RDW: 18.2 % — ABNORMAL HIGH (ref 11.5–15.5)
WBC: 17.8 10*3/uL — ABNORMAL HIGH (ref 4.0–10.5)
nRBC: 0 % (ref 0.0–0.2)

## 2019-03-07 LAB — URINALYSIS, ROUTINE W REFLEX MICROSCOPIC
Bilirubin Urine: NEGATIVE
Glucose, UA: NEGATIVE mg/dL
Hgb urine dipstick: NEGATIVE
Ketones, ur: NEGATIVE mg/dL
Nitrite: POSITIVE — AB
Protein, ur: NEGATIVE mg/dL
Specific Gravity, Urine: 1.016 (ref 1.005–1.030)
pH: 5 (ref 5.0–8.0)

## 2019-03-07 LAB — PROTIME-INR
INR: 1.1 (ref 0.8–1.2)
Prothrombin Time: 14 seconds (ref 11.4–15.2)

## 2019-03-07 LAB — LACTIC ACID, PLASMA
Lactic Acid, Venous: 2.6 mmol/L (ref 0.5–1.9)
Lactic Acid, Venous: 1.6 mmol/L (ref 0.5–1.9)

## 2019-03-07 LAB — SARS CORONAVIRUS 2 BY RT PCR (HOSPITAL ORDER, PERFORMED IN ~~LOC~~ HOSPITAL LAB): SARS Coronavirus 2: NEGATIVE

## 2019-03-07 LAB — TSH: TSH: 1.118 u[IU]/mL (ref 0.350–4.500)

## 2019-03-07 LAB — LIPASE, BLOOD: Lipase: 71 U/L — ABNORMAL HIGH (ref 11–51)

## 2019-03-07 LAB — GLUCOSE, CAPILLARY: Glucose-Capillary: 124 mg/dL — ABNORMAL HIGH (ref 70–99)

## 2019-03-07 LAB — APTT: aPTT: 35 seconds (ref 24–36)

## 2019-03-07 LAB — PROCALCITONIN: Procalcitonin: 25.11 ng/mL

## 2019-03-07 MED ORDER — SIMVASTATIN 20 MG PO TABS
40.0000 mg | ORAL_TABLET | Freq: Every day | ORAL | Status: DC
  Administered 2019-03-08 – 2019-03-11 (×4): 40 mg via ORAL
  Filled 2019-03-07 (×3): qty 2

## 2019-03-07 MED ORDER — ONDANSETRON HCL 4 MG/2ML IJ SOLN: 4.0000 mg | Freq: Four times a day (QID) | INTRAMUSCULAR | Status: DC | PRN

## 2019-03-07 MED ORDER — SODIUM CHLORIDE 0.9 % IV BOLUS
250.0000 mL | Freq: Once | INTRAVENOUS | Status: AC
  Administered 2019-03-07: 250 mL via INTRAVENOUS

## 2019-03-07 MED ORDER — VANCOMYCIN HCL IN DEXTROSE 1-5 GM/200ML-% IV SOLN: 1000.0000 mg | Freq: Once | INTRAVENOUS | Status: DC

## 2019-03-07 MED ORDER — SODIUM CHLORIDE 0.9 % IV SOLN
INTRAVENOUS | Status: DC
  Administered 2019-03-07 – 2019-03-08 (×3): via INTRAVENOUS

## 2019-03-07 MED ORDER — INSULIN ASPART 100 UNIT/ML ~~LOC~~ SOLN
0.0000 [IU] | Freq: Every day | SUBCUTANEOUS | Status: DC
  Administered 2019-03-09: 2 [IU] via SUBCUTANEOUS
  Administered 2019-03-10: 3 [IU] via SUBCUTANEOUS

## 2019-03-07 MED ORDER — ZIPRASIDONE HCL 40 MG PO CAPS
40.0000 mg | ORAL_CAPSULE | Freq: Every day | ORAL | Status: DC
  Administered 2019-03-07 – 2019-03-11 (×5): 40 mg via ORAL
  Filled 2019-03-07 (×6): qty 1

## 2019-03-07 MED ORDER — VANCOMYCIN HCL IN DEXTROSE 1-5 GM/200ML-% IV SOLN: 1000.0000 mg | INTRAVENOUS | Status: DC

## 2019-03-07 MED ORDER — SODIUM CHLORIDE 0.9 % IV BOLUS (SEPSIS)
500.0000 mL | Freq: Once | INTRAVENOUS | Status: AC
  Administered 2019-03-07: 500 mL via INTRAVENOUS

## 2019-03-07 MED ORDER — SODIUM CHLORIDE 0.9 % IV BOLUS
1000.0000 mL | Freq: Once | INTRAVENOUS | Status: AC
  Administered 2019-03-07: 1000 mL via INTRAVENOUS

## 2019-03-07 MED ORDER — SODIUM CHLORIDE 0.9 % IV SOLN
2.0000 g | Freq: Two times a day (BID) | INTRAVENOUS | Status: DC
  Administered 2019-03-07 – 2019-03-12 (×10): 2 g via INTRAVENOUS
  Filled 2019-03-07 (×11): qty 2

## 2019-03-07 MED ORDER — VENLAFAXINE HCL ER 75 MG PO CP24
150.0000 mg | ORAL_CAPSULE | Freq: Every morning | ORAL | Status: DC
  Administered 2019-03-08 – 2019-03-12 (×5): 150 mg via ORAL
  Filled 2019-03-07 (×5): qty 2

## 2019-03-07 MED ORDER — GUAIFENESIN ER 600 MG PO TB12
600.0000 mg | ORAL_TABLET | Freq: Two times a day (BID) | ORAL | Status: DC
  Administered 2019-03-07 – 2019-03-08 (×3): 600 mg via ORAL
  Filled 2019-03-07 (×3): qty 1

## 2019-03-07 MED ORDER — METHYLPREDNISOLONE SODIUM SUCC 125 MG IJ SOLR
60.0000 mg | Freq: Two times a day (BID) | INTRAMUSCULAR | Status: DC
  Administered 2019-03-08 – 2019-03-12 (×9): 60 mg via INTRAVENOUS
  Filled 2019-03-07 (×9): qty 2

## 2019-03-07 MED ORDER — PANTOPRAZOLE SODIUM 40 MG PO TBEC
40.0000 mg | DELAYED_RELEASE_TABLET | Freq: Every day | ORAL | Status: DC
  Administered 2019-03-08 – 2019-03-12 (×5): 40 mg via ORAL
  Filled 2019-03-07 (×5): qty 1

## 2019-03-07 MED ORDER — LEVOTHYROXINE SODIUM 75 MCG PO TABS
75.0000 ug | ORAL_TABLET | Freq: Every day | ORAL | Status: DC
  Administered 2019-03-08 – 2019-03-12 (×5): 75 ug via ORAL
  Filled 2019-03-07 (×5): qty 1

## 2019-03-07 MED ORDER — SODIUM CHLORIDE 0.9% FLUSH: 3.0000 mL | Freq: Once | INTRAVENOUS | Status: DC

## 2019-03-07 MED ORDER — FLUTICASONE FUROATE-VILANTEROL 100-25 MCG/INH IN AEPB
1.0000 | INHALATION_SPRAY | Freq: Every day | RESPIRATORY_TRACT | Status: DC
  Administered 2019-03-09 – 2019-03-10 (×2): 1 via RESPIRATORY_TRACT
  Filled 2019-03-07: qty 28

## 2019-03-07 MED ORDER — ENOXAPARIN SODIUM 40 MG/0.4ML ~~LOC~~ SOLN
40.0000 mg | SUBCUTANEOUS | Status: DC
  Administered 2019-03-08 – 2019-03-11 (×4): 40 mg via SUBCUTANEOUS
  Filled 2019-03-07 (×4): qty 0.4

## 2019-03-07 MED ORDER — VANCOMYCIN HCL 10 G IV SOLR
1500.0000 mg | Freq: Once | INTRAVENOUS | Status: AC
  Administered 2019-03-07: 1500 mg via INTRAVENOUS
  Filled 2019-03-07: qty 1500

## 2019-03-07 MED ORDER — SODIUM CHLORIDE 0.9 % IV BOLUS (SEPSIS)
1000.0000 mL | Freq: Once | INTRAVENOUS | Status: AC
  Administered 2019-03-07: 1000 mL via INTRAVENOUS

## 2019-03-07 MED ORDER — ACETAMINOPHEN 325 MG PO TABS
650.0000 mg | ORAL_TABLET | Freq: Four times a day (QID) | ORAL | Status: DC | PRN
  Administered 2019-03-11: 650 mg via ORAL
  Filled 2019-03-07: qty 2

## 2019-03-07 MED ORDER — SODIUM CHLORIDE 0.9 % IV SOLN
2.0000 g | Freq: Once | INTRAVENOUS | Status: AC
  Administered 2019-03-07: 2 g via INTRAVENOUS
  Filled 2019-03-07: qty 2

## 2019-03-07 MED ORDER — FLUTICASONE-UMECLIDIN-VILANT 100-62.5-25 MCG/INH IN AEPB: 1.0000 | INHALATION_SPRAY | Freq: Every day | RESPIRATORY_TRACT | Status: DC

## 2019-03-07 MED ORDER — ALBUTEROL SULFATE (2.5 MG/3ML) 0.083% IN NEBU
2.5000 mg | INHALATION_SOLUTION | Freq: Four times a day (QID) | RESPIRATORY_TRACT | Status: DC
  Administered 2019-03-08: 2.5 mg via RESPIRATORY_TRACT
  Filled 2019-03-07: qty 3

## 2019-03-07 MED ORDER — ACETAMINOPHEN 650 MG RE SUPP: 650.0000 mg | Freq: Four times a day (QID) | RECTAL | Status: DC | PRN

## 2019-03-07 MED ORDER — ALBUTEROL SULFATE HFA 108 (90 BASE) MCG/ACT IN AERS
2.0000 | INHALATION_SPRAY | Freq: Once | RESPIRATORY_TRACT | Status: AC
  Administered 2019-03-07: 2 via RESPIRATORY_TRACT
  Filled 2019-03-07: qty 6.7

## 2019-03-07 MED ORDER — SODIUM CHLORIDE 0.9% FLUSH
3.0000 mL | Freq: Two times a day (BID) | INTRAVENOUS | Status: DC
  Administered 2019-03-08 – 2019-03-11 (×6): 3 mL via INTRAVENOUS
  Administered 2019-03-12: 10 mL via INTRAVENOUS

## 2019-03-07 MED ORDER — ONDANSETRON HCL 4 MG PO TABS: 4.0000 mg | ORAL_TABLET | Freq: Four times a day (QID) | ORAL | Status: DC | PRN

## 2019-03-07 MED ORDER — METHYLPREDNISOLONE SODIUM SUCC 125 MG IJ SOLR
125.0000 mg | Freq: Once | INTRAMUSCULAR | Status: AC
  Administered 2019-03-07: 125 mg via INTRAVENOUS
  Filled 2019-03-07: qty 2

## 2019-03-07 MED ORDER — METHYLPREDNISOLONE SODIUM SUCC 125 MG IJ SOLR: 60.0000 mg | Freq: Three times a day (TID) | INTRAMUSCULAR | Status: DC

## 2019-03-07 MED ORDER — NYSTATIN 100000 UNIT/GM EX POWD
1.0000 g | Freq: Two times a day (BID) | CUTANEOUS | Status: DC | PRN
  Filled 2019-03-07: qty 15

## 2019-03-07 MED ORDER — INSULIN ASPART 100 UNIT/ML ~~LOC~~ SOLN
0.0000 [IU] | Freq: Three times a day (TID) | SUBCUTANEOUS | Status: DC
  Administered 2019-03-08: 3 [IU] via SUBCUTANEOUS
  Administered 2019-03-08: 2 [IU] via SUBCUTANEOUS
  Administered 2019-03-08 – 2019-03-09 (×3): 8 [IU] via SUBCUTANEOUS
  Administered 2019-03-09: 2 [IU] via SUBCUTANEOUS
  Administered 2019-03-10: 3 [IU] via SUBCUTANEOUS
  Administered 2019-03-10: 5 [IU] via SUBCUTANEOUS
  Administered 2019-03-11: 8 [IU] via SUBCUTANEOUS
  Administered 2019-03-11 (×2): 3 [IU] via SUBCUTANEOUS
  Administered 2019-03-12: 11 [IU] via SUBCUTANEOUS
  Administered 2019-03-12: 3 [IU] via SUBCUTANEOUS

## 2019-03-07 MED ORDER — GABAPENTIN 600 MG PO TABS
1200.0000 mg | ORAL_TABLET | Freq: Two times a day (BID) | ORAL | Status: DC
  Administered 2019-03-07 – 2019-03-12 (×10): 1200 mg via ORAL
  Filled 2019-03-07 (×11): qty 2

## 2019-03-07 MED ORDER — UMECLIDINIUM BROMIDE 62.5 MCG/INH IN AEPB
1.0000 | INHALATION_SPRAY | Freq: Every day | RESPIRATORY_TRACT | Status: DC
  Filled 2019-03-07: qty 7

## 2019-03-07 NOTE — ED Notes (Signed)
Heart healthy carb modified dinner tray ordered 

## 2019-03-07 NOTE — H&P (Addendum)
History and Physical    CANESHA Clark MPN:[id number] DOB: 03/25/1946 DOA: 03/07/2019  Referring MD/NP/PA: Marda Stalker, MD PCP: Maryella Shivers, MD  Patient coming from: Home via EMS  Chief Complaint: Altered mental status  I have personally briefly reviewed patient's old medical records in [place]   HPI: Karla Clark is a 73 y.o. female with medical history significant of HTN, HLD, COPD, DM type II, pneumonia, rheumatoid arthritis, hypothyroidism, anxiety, and schizophrenia presenting after found to be acutely altered at home.  History is mostly obtained from the patient's daughter over the phone as the patient is unable to provide her own history at this time due to her current state.  At baseline the patient is not very mobile due to her rheumatoid arthritis.  At around 4:30-5 am her daughter heard the patient hollering and found her the floor unable to get up.  With assistance they were able to get her to bed, but noticed that she was short of breath and wheezing.  She denied any complaints of pain and had had multiple other falls per the daughter.  Daughter states that the patient had had a intermittent cough, but nothing out of the ordinary otherwise seem fine earlier that night.  Patient also complained of associated symptoms of nausea and vomiting.  Patient normally gets pneumonia 2 or 3 times a year.  Last admitted to the hospital for pneumonia back in June.  At some point time they had question of patient aspirating, but she reportedly passed swallow studies.  Despite this the patient daughter thinks that she likely is aspirating.  She had also previously been recommended to go home on oxygen, but refused once the people came to drop off equipment.  Daughter notes that she intermittently says odd things, but was talking confused than usual this morning.  In route with EMS patient was noted to be acutely altered and  Hypotensive with systolic blood pressures in the  70s with O2 saturations 88% on 2 L and increased to 4 L with improvement in O2 saturations.  Given 500 mL normal saline IV fluids in route     ED Course: Patient was noted to be afebrile, tachypneic, and hypotensive 88/48 with improvement with bolus of 2.5 L normal saline IV fluids.  Labs significant for WBC 17.8, hemoglobin 8.8, BUN 21,Creatitine 1.43, and lactic acid 2.6.  X-ray showing a multifocal pneumonia. COVID-19 screening negative.  Blood cultures were obtained and patient was started on empiric antibiotics of vancomycin and cefepime.   TRH called to admit.  Review of systems: A complete 10 point review of systems was unable to be performed due to patient being altered.  Past Medical History:  Diagnosis Date  . Anxiety   . COPD (chronic obstructive pulmonary disease) ([place])   . Diabetes mellitus without complication ([place])   . Elevated cholesterol   . Hypertension   . Hypothyroidism   . Osteoarthritis   . RA (rheumatoid arthritis) ([place])   . 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 ABDOMINAL HYSTERECTOMY       reports that she quit smoking about 33 years ago. She has never used smokeless tobacco. She reports that she does not drink alcohol or use drugs.  Allergies  Allergen Reactions  . Ace Inhibitors Cough    No family history on file.  Prior to Admission medications   Medication Sig Start Date End Date Taking? Authorizing Provider  albuterol (  PROVENTIL) (2.5 MG/3ML) 0.083% nebulizer solution Take 2.5 mg by nebulization every 6 (six) hours as needed for wheezing or shortness of breath.   Yes [provider]  Fluticasone-Umeclidin-Vilant (TRELEGY ELLIPTA) 100-62.5-25 MCG/INH AEPB Inhale 1 puff into the lungs daily. 12/05/17  Yes [provider]  doxycycline (VIBRAMYCIN) 100 MG capsule Take 1 capsule (100 mg total) by mouth 2 (two) times daily. Patient not taking: Reported on 03/07/2019 08/14/17   Roxy Horseman, PA-C  folic acid (FOLVITE) 1 MG tablet TAKE 1 TABLET BY MOUTH EVERY DAY 03/30/17   Pollyann Savoy, MD  gabapentin (NEURONTIN) 600 MG tablet Take 1,200 mg by mouth 2 (two) times daily. 03/02/19   [provider]  levothyroxine (SYNTHROID) 75 MCG tablet Take 75 mcg by mouth daily. 01/21/19   [provider]  losartan (COZAAR) 100 MG tablet Take 100 mg by mouth daily. 02/16/19   [provider]  metFORMIN (GLUCOPHAGE-XR) 500 MG 24 hr tablet TK 2 TS PO D 07/05/16   [provider]  NYSTATIN powder APPLY POWDER TWICE DAILY AS NEEDED 02/05/16   [provider]  pantoprazole (PROTONIX) 40 MG tablet Take 40 mg by mouth daily. 12/28/18   [provider]  pioglitazone (ACTOS) 30 MG tablet TK 1 T PO D 01/27/16   [provider]  simvastatin (ZOCOR) 40 MG tablet TK 1 T PO HS 01/12/16   [provider]  venlafaxine XR (EFFEXOR-XR) 150 MG 24 hr capsule Take 150 mg by mouth every morning. 02/15/19   [provider]  ziprasidone (GEODON) 20 MG capsule Take 20 mg by mouth every morning. 02/26/19   [provider]    Physical Exam:  Constitutional: Elderly female who appears chronically ill Vitals:   03/07/19 1245 03/07/19 1300 03/07/19 1315 03/07/19 1330  BP: (!) 113/57 (!) 104/47 (!) 112/48 (!) 114/54  Pulse: 90 89 90 90  Resp: 18 19 18 19   Temp:      TempSrc:      SpO2: 100% 99% 98% 100%  Weight:      Height:       Eyes: PERRL, lids and conjunctivae normal ENMT: Mucous membranes are dry.  Posterior pharynx clear of any exudate or lesions. Neck: normal, supple, no masses, no thyromegaly Respiratory: Bilateral pulmonary rhonchi appreciated with expiratory wheeze.  Patient on tube repleted O2 saturations Cardiovascular: Regular rate and rhythm, no murmurs / rubs / gallops. No extremity edema. 2+ pedal pulses. No carotid bruits.  Abdomen: no tenderness, no masses palpated. No hepatosplenomegaly. Bowel sounds  positive.  Musculoskeletal: no clubbing / cyanosis.  Joint deformity secondary to rheumatoid arthritis Neurologic: CN 2-12 grossly intact. Sensation intact, DTR normal. Strength 4+/5 in all 4.  Psychiatric:  Lethargic, but arousable.  Oriented to person and place.     Labs on Admission: I have personally reviewed following labs and imaging studies  CBC: Recent Labs  Lab 03/07/19 1123  WBC 17.8*  NEUTROABS 16.2*  HGB 8.8*  HCT 28.7*  MCV 86.2  PLT 230   Basic Metabolic Panel: Recent Labs  Lab 03/07/19 1123  NA 135  K 4.0  CL 104  CO2 21*  GLUCOSE 150*  BUN 21  CREATININE 1.43*  CALCIUM 8.2*   GFR: Estimated Creatinine Clearance: 33.2 mL/min (A) (by C-G formula based on SCr of 1.43 mg/dL (H)). Liver Function Tests: Recent Labs  Lab 03/07/19 1123  AST 23  ALT 11  ALKPHOS 57  BILITOT 0.4  PROT 5.3*  ALBUMIN 2.7*  Recent Labs  Lab 03/07/19 1203  LIPASE 71*   No results for input(s): AMMONIA in the last 168 hours. Coagulation Profile: Recent Labs  Lab 03/07/19 1203  INR 1.1   Cardiac Enzymes: No results for input(s): CKTOTAL, CKMB, CKMBINDEX, TROPONINI in the last 168 hours. BNP (last 3 results) No results for input(s): PROBNP in the last 8760 hours. HbA1C: No results for input(s): HGBA1C in the last 72 hours. CBG: No results for input(s): GLUCAP in the last 168 hours. Lipid Profile: No results for input(s): CHOL, HDL, LDLCALC, TRIG, CHOLHDL, LDLDIRECT in the last 72 hours. Thyroid Function Tests: No results for input(s): TSH, T4TOTAL, FREET4, T3FREE, THYROIDAB in the last 72 hours. Anemia Panel: No results for input(s): VITAMINB12, FOLATE, FERRITIN, TIBC, IRON, RETICCTPCT in the last 72 hours. Urine analysis: No results found for: COLORURINE, APPEARANCEUR, LABSPEC, PHURINE, GLUCOSEU, HGBUR, BILIRUBINUR, KETONESUR, PROTEINUR, UROBILINOGEN, NITRITE, LEUKOCYTESUR Sepsis Labs: Recent Results (from the past 240 hour(s))  SARS Coronavirus 2 by RT PCR  (hospital order, performed in [facility] hospital lab) Nasopharyngeal Nasopharyngeal Swab     Status: None   Collection Time: 03/07/19 12:12 PM   Specimen: Nasopharyngeal Swab  Result Value Ref Range Status   SARS Coronavirus 2 NEGATIVE NEGATIVE Final    Comment: (NOTE) If result is NEGATIVE SARS-CoV-2 target nucleic acids are NOT DETECTED. The SARS-CoV-2 RNA is generally detectable in upper and lower  respiratory specimens during the acute phase of infection. The lowest  concentration of SARS-CoV-2 viral copies this assay can detect is 250  copies / mL. A negative result does not preclude SARS-CoV-2 infection  and should not be used as the sole basis for treatment or other  patient management decisions.  A negative result may occur with  improper specimen collection / handling, submission of specimen other  than nasopharyngeal swab, presence of viral mutation(s) within the  areas targeted by this assay, and inadequate number of viral copies  (<250 copies / mL). A negative result must be combined with clinical  observations, patient history, and epidemiological information. If result is POSITIVE SARS-CoV-2 target nucleic acids are DETECTED. The SARS-CoV-2 RNA is generally detectable in upper and lower  respiratory specimens dur ing the acute phase of infection.  Positive  results are indicative of active infection with SARS-CoV-2.  Clinical  correlation with patient history and other diagnostic information is  necessary to determine patient infection status.  Positive results do  not rule out bacterial infection or co-infection with other viruses. If result is PRESUMPTIVE POSTIVE SARS-CoV-2 nucleic acids MAY BE PRESENT.   A presumptive positive result was obtained on the submitted specimen  and confirmed on repeat testing.  While 2019 novel coronavirus  (SARS-CoV-2) nucleic acids may be present in the submitted sample  additional confirmatory testing may be necessary for  epidemiological  and / or clinical management purposes  to differentiate between  SARS-CoV-2 and other Sarbecovirus currently known to infect humans.  If clinically indicated additional testing with an alternate test  methodology [phone]) is advised. The SARS-CoV-2 RNA is generally  detectable in upper and lower respiratory sp ecimens during the acute  phase of infection. The expected result is Negative. Fact Sheet for Patients:  BoilerBrush.com.cy Fact Sheet for Healthcare Providers: https://pope.com/ This test is not yet approved or cleared by the [place] FDA and has been authorized for detection and/or diagnosis of SARS-CoV-2 by FDA under an Emergency Use Authorization (EUA).  This EUA will remain in effect (meaning this test can be used)  for the duration of the COVID-19 declaration under Section 564(b)(1) of the Act, 21 U.S.C. section 360bbb-3(b)(1), unless the authorization is terminated or revoked sooner. Performed at [facility], [address] [place], [place] [address]      Radiological Exams on Admission: Dg Chest Portable 1 View  Result Date: 03/07/2019 CLINICAL DATA:  Shortness of breath and altered mental status. EXAM: PORTABLE CHEST 1 VIEW COMPARISON:  10/18/2018 FINDINGS: Lungs are adequately inflated demonstrate new multifocal airspace process over the right lung and left base/retrocardiac region likely multifocal pneumonia. Suggestion of a small amount right pleural fluid. Cardiomediastinal silhouette and remainder of the exam is unchanged. IMPRESSION: Multifocal bilateral airspace process worse over the right lung likely multifocal pneumonia with suggestion of small right effusion. Electronically Signed   By: Elberta Fortisaniel  Boyle M.D.   On: 03/07/2019 11:47    EKG: Independently reviewed.  Sinus rhythm at 94 bpm and otherwise similar to previous tracings  Assessment/Plan Sepsis secondary to pneumonia: Acute.   Patient presents after being found to be hypotensive and tachypneic with WBC 17.8 and lactic acid 2.6.  Chest x-ray showing a multifocal pneumonia, but COVID-19 screening was negative. -Admit to a telemetry bed -Aspiration precautions  -Follow-up blood, urine, and sputum cultures -Check pro calcitonin(25.11) -Continue empiric antibiotics of vancomycin and cefepime -Trend lactic acid level -Mucinex -Recheck CBC in a.m.  Respiratory failure with hypoxia, COPD exacerbation: Acute on chronic.  Initially noted to be hypoxic was 88% with EMS apartment placement to 4 L nasal cannula oxygen to improve her O2 saturation greater than 92%.  Still noted to be wheezing on physical exam.  Daughter made note of the patient previously being sent home on oxygen, but refusing oxygen once she was home. -Continuous pulse oximetry with nasal cannula oxygen to maintain O2 saturations -Albuterol nebs 4 times daily -Continue Trelegy -Solu-Medrol 125 mg IV x1 dose, then 60 mg IV every 12 hours  Urinary tract infection: Urinalysis was positive for signs of infection. -Follow-up urine culture -Antibiotics as seen above  Acute metabolic encephalopathy: Patient noted to be altered and making odd statements than usual.  -Neurochecks -Advance diet as tolerated   Transient hypotension: Acute.  Patient intermittently found to have maps less than 65.  Normally on antihypertensives of losartan. -Hold antihypertensive agents -Bolus normal saline IV fluids as needed and placed on a continuous rate of 100 mL/h  Acute kidney injury: Patient baseline creatinine previously noted to be within normal limits but presents with creatinine 1.43 with BUN 21. -IV fluids -Hold nephrotoxic agents -Recheck kidney function in a.m  Normocytic anemia: Acute.  Hemoglobin noted to be 8.8 on admission.  No reports of bleeding or noted. -Continue monitor  Elevated lipase: Acute.  Lipase 71 on admission.  Patient denies any reports of any  significant abdominal pain. -IV fluids    Hypothyroidism -Check TSH -Continue levothyroxine  Diabetes mellitus type 2 -Hypoglycemic protocols -Hold Metformin and Actos -CBGs before every meal and at bedtime with moderate SSI  Rheumatoid arthritis  Schzophrenia -Continue Effexor and Geodon  DVT prophylaxis: Lovenox Code Status: full Family Communication: Discussed plan of care with the patient's daughter over the phone Disposition Plan: To be determined Consults called: None  Admission status: Inpatient   Clydie Braunondell A Smith MD [facility] Pager [phone]   If 7PM-7AM, please contact night-coverage [email or web address] Password [email or web address]  03/07/2019, 1:52 PM

## 2019-03-07 NOTE — Progress Notes (Signed)
Pharmacy Antibiotic Note  Karla Clark is a 73 y.o. female admitted on 03/07/2019 with sepsis.  Pharmacy has been consulted for vancomycin and cefepime dosing. Pt is afebrile and WBC is elevated at 17.8. SCr is elevated at 1.43.   Plan: Vancomycin 1500mg  IV x 1 then 1gm IV Q48H Cefepime 2gm IV Q12H F/u renal fxn, C&S, clinical status and peak/trough at SS    Temp (24hrs), Avg:98.8 F (37.1 C), Min:98 F (36.7 C), Max:99.2 F (37.3 C)  Recent Labs  Lab 03/07/19 1123  WBC 17.8*  CREATININE 1.43*    Estimated Creatinine Clearance: 33.2 mL/min (A) (by C-G formula based on SCr of 1.43 mg/dL (H)).    Allergies  Allergen Reactions  . Ace Inhibitors Cough    Antimicrobials this admission: Vanc 11/4>> Cefepime 11/4>>  Dose adjustments this admission: N/A  Microbiology results: Pending  Thank you for allowing pharmacy to be a part of this patient's care.  Susi Goslin, Rande Lawman 03/07/2019 12:15 PM

## 2019-03-07 NOTE — ED Notes (Signed)
Dr Tamala Julian paged about BP, new verbal orders given

## 2019-03-07 NOTE — ED Notes (Signed)
Lattie Haw (daughter): [phone]

## 2019-03-07 NOTE — ED Provider Notes (Signed)
[facility]   CSN: [id number] Arrival date & time: 03/07/19  1115     History   Chief Complaint Chief Complaint  Patient presents with   Altered Mental Status   Hypotension    HPI Karla Clark is a 73 y.o. female.     The history is provided by the patient and medical records. No language interpreter was used.  Shortness of Breath Severity:  Severe Onset quality:  Gradual Duration:  3 days Timing:  Constant Progression:  Waxing and waning Chronicity:  New Context: URI   Relieved by:  Nothing Worsened by:  Coughing Ineffective treatments:  None tried Associated symptoms: cough and sputum production   Associated symptoms: no chest pain, no diaphoresis, no fever, no headaches, no neck pain, no rash, no vomiting and no wheezing     Past Medical History:  Diagnosis Date   Anxiety    COPD (chronic obstructive pulmonary disease) (HCC)    Diabetes mellitus without complication (HCC)    Elevated cholesterol    Hypertension    Hypothyroidism    Osteoarthritis    RA (rheumatoid arthritis) (HCC)    Schizophrenia (HCC)    delusional    Patient Active Problem List   Diagnosis Date Noted   Rheumatoid arthritis with rheumatoid factor of multiple sites without organ or systems involvement (HCC) 07/19/2016   High risk medications (not anticoagulants) long-term use 07/19/2016   Osteoarthritis of both knees 07/19/2016   Osteoarthritis of both hands 07/19/2016   Primary osteoarthritis of both feet 07/19/2016    Past Surgical History:  Procedure Laterality Date   ABDOMINAL HYSTERECTOMY       OB History   No obstetric history on file.      Home Medications    Prior to Admission medications   Medication Sig Start Date End Date Taking? Authorizing Provider  budesonide (PULMICORT) 0.5 MG/2ML nebulizer solution INHALE CONTENTS OF 1 VIAL VIA NEBULIZER BID 06/12/16   [provider]   clotrimazole-betamethasone (LOTRISONE) cream APP CREAM TO VULAR AREA BID PRF REDNESS AND ITCHING 01/13/16   [provider]  doxycycline (VIBRAMYCIN) 100 MG capsule Take 1 capsule (100 mg total) by mouth 2 (two) times daily. 08/14/17   Roxy HorsemanBrowning, Robert, PA-C  fluPHENAZine (PROLIXIN) 1 MG tablet TK 1 T PO BID 02/19/16   [provider]  folic acid (FOLVITE) 1 MG tablet TAKE 1 TABLET BY MOUTH EVERY DAY 03/30/17   Pollyann Savoyeveshwar, Shaili, MD  hydrOXYzine (ATARAX/VISTARIL) 25 MG tablet TK 1/2 T PO BID PRN 01/04/16   [provider]  levothyroxine (SYNTHROID, LEVOTHROID) 88 MCG tablet TK 1 T PO D 02/27/16   [provider]  metFORMIN (GLUCOPHAGE-XR) 500 MG 24 hr tablet TK 2 TS PO D 07/05/16   [provider]  nystatin cream (MYCOSTATIN) APP EXT AA BID FOR 7 TO 10 DAYS 02/05/16   [provider]  NYSTATIN powder APPLY POWDER TWICE DAILY AS NEEDED 02/05/16   [provider]  omeprazole (PRILOSEC) 20 MG capsule TK 1 C PO D 01/27/16   [provider]  pioglitazone (ACTOS) 30 MG tablet TK 1 T PO D 01/27/16   [provider]  simvastatin (ZOCOR) 40 MG tablet TK 1 T PO HS 01/12/16   [provider]  valsartan (DIOVAN) 160 MG tablet TK 1 T PO QD 01/07/16   [provider]  venlafaxine XR (EFFEXOR-XR) 75 MG 24 hr capsule TK 1 C PO QAM 02/19/16  [provider]    Family History No family history on file.  Social History Social History   Tobacco Use   Smoking status: Former Smoker    Quit date: 03/11/1986    Years since quitting: 33.0   Smokeless tobacco: Never Used  Substance Use Topics   Alcohol use: No   Drug use: No     Allergies   Ace inhibitors   Review of Systems Review of Systems  Constitutional: Positive for chills. Negative for diaphoresis, fatigue and fever.  HENT: Negative for congestion.   Respiratory: Positive for cough, sputum production, chest tightness and shortness of breath.  Negative for wheezing and stridor.   Cardiovascular: Negative for chest pain, palpitations and leg swelling.  Gastrointestinal: Negative for constipation, diarrhea, nausea and vomiting.  Genitourinary: Negative for dysuria, flank pain and frequency.  Musculoskeletal: Negative for back pain, neck pain and neck stiffness.  Skin: Negative for rash and wound.  Neurological: Negative for light-headedness, numbness and headaches.  Psychiatric/Behavioral: Negative for agitation.  All other systems reviewed and are negative.    Physical Exam Updated Vital Signs BP (!) 91/48 (BP Location: Left Arm)    Pulse 92    Temp 98 F (36.7 C) (Oral)    Resp 18    SpO2 94%   Physical Exam Vitals signs and nursing note reviewed.  Constitutional:      General: She is not in acute distress.    Appearance: She is well-developed. She is ill-appearing. She is not toxic-appearing or diaphoretic.  HENT:     Head: Normocephalic and atraumatic.     Right Ear: External ear normal.     Left Ear: External ear normal.     Nose: Nose normal. No congestion or rhinorrhea.     Mouth/Throat:     Mouth: Mucous membranes are moist.     Pharynx: No oropharyngeal exudate or posterior oropharyngeal erythema.  Eyes:     Conjunctiva/sclera: Conjunctivae normal.     Pupils: Pupils are equal, round, and reactive to light.  Neck:     Musculoskeletal: Normal range of motion and neck supple.  Cardiovascular:     Rate and Rhythm: Normal rate.     Pulses: Normal pulses.     Heart sounds: No murmur.  Pulmonary:     Effort: Pulmonary effort is normal. No respiratory distress.     Breath sounds: No stridor. Rhonchi present. No wheezing.  Chest:     Chest wall: No tenderness.  Abdominal:     General: There is no distension.     Tenderness: There is no abdominal tenderness. There is no right CVA tenderness, left CVA tenderness or rebound.  Musculoskeletal:        General: No tenderness.     Right lower leg: No edema.      Left lower leg: No edema.  Skin:    General: Skin is warm.     Capillary Refill: Capillary refill takes less than 2 seconds.     Findings: No erythema or rash.  Neurological:     Mental Status: She is alert and oriented to person, place, and time.     Motor: No abnormal muscle tone.     Deep Tendon Reflexes: Reflexes are normal and symmetric.  Psychiatric:        Mood and Affect: Mood normal.      ED Treatments / Results  Labs (all labs ordered are listed, but only abnormal results are displayed) Labs Reviewed  LACTIC ACID, PLASMA -  Abnormal; Notable for the following components:      Result Value   Lactic Acid, Venous 2.6 (*)    All other components within normal limits  COMPREHENSIVE METABOLIC PANEL - Abnormal; Notable for the following components:   CO2 21 (*)    Glucose, Bld 150 (*)    Creatinine, Ser 1.43 (*)    Calcium 8.2 (*)    Total Protein 5.3 (*)    Albumin 2.7 (*)    GFR calc non Af Amer 36 (*)    GFR calc Af Amer 42 (*)    All other components within normal limits  CBC WITH DIFFERENTIAL/PLATELET - Abnormal; Notable for the following components:   WBC 17.8 (*)    RBC 3.33 (*)    Hemoglobin 8.8 (*)    HCT 28.7 (*)    RDW 18.2 (*)    Neutro Abs 16.2 (*)    Lymphs Abs 0.4 (*)    Monocytes Absolute 1.1 (*)    Abs Immature Granulocytes 0.14 (*)    All other components within normal limits  LIPASE, BLOOD - Abnormal; Notable for the following components:   Lipase 71 (*)    All other components within normal limits  SARS CORONAVIRUS 2 BY RT PCR (HOSPITAL ORDER, PERFORMED IN [place] [facility])  URINE CULTURE  CULTURE, BLOOD (ROUTINE X 2)  CULTURE, BLOOD (ROUTINE X 2)  EXPECTORATED SPUTUM ASSESSMENT W REFEX TO RESP CULTURE  LACTIC ACID, PLASMA  APTT  PROTIME-INR  PROCALCITONIN  URINALYSIS, ROUTINE W REFLEX MICROSCOPIC  TSH    EKG EKG Interpretation  Date/Time:  Wednesday March 07 2019 11:19:38 EST Ventricular Rate:  94 PR Interval:    QRS  Duration: 88 QT Interval:  381 QTC Calculation: 477 R Axis:   63 Text Interpretation: Sinus rhythm Low voltage, precordial leads Borderline T abnormalities, anterior leads When compared to prior, no significant changes seen. No STEMI Confirmed by Theda Belfast ([id number]) on 03/07/2019 11:27:11 AM   Radiology Dg Chest Portable 1 View  Result Date: 03/07/2019 CLINICAL DATA:  Shortness of breath and altered mental status. EXAM: PORTABLE CHEST 1 VIEW COMPARISON:  10/18/2018 FINDINGS: Lungs are adequately inflated demonstrate new multifocal airspace process over the right lung and left base/retrocardiac region likely multifocal pneumonia. Suggestion of a small amount right pleural fluid. Cardiomediastinal silhouette and remainder of the exam is unchanged. IMPRESSION: Multifocal bilateral airspace process worse over the right lung likely multifocal pneumonia with suggestion of small right effusion. Electronically Signed   By: Elberta Fortis M.D.   On: 03/07/2019 11:47    Procedures Procedures (including critical care time)  Karla Clark was evaluated in Emergency Department on 03/07/2019 for the symptoms described in the history of present illness. She was evaluated in the context of the global COVID-19 pandemic, which necessitated consideration that the patient might be at risk for infection with the SARS-CoV-2 virus that causes COVID-19. Institutional protocols and algorithms that pertain to the evaluation of patients at risk for COVID-19 are in a state of rapid change based on information released by regulatory bodies including the CDC and federal and state organizations. These policies and algorithms were followed during the patient's care in the ED.  Marland KitchenCRITICAL CARE Performed by: Canary Brim Lamark Schue Total critical care time: 35 minutes Critical care time was exclusive of separately billable procedures and treating other patients. Critical care was necessary to treat or prevent imminent or  life-threatening deterioration. Critical care was time spent personally by me on the following activities:  development of treatment plan with patient and/or surrogate as well as nursing, discussions with consultants, evaluation of patient's response to treatment, examination of patient, obtaining history from patient or surrogate, ordering and performing treatments and interventions, ordering and review of laboratory studies, ordering and review of radiographic studies, pulse oximetry and re-evaluation of patient's condition.   Medications Ordered in ED Medications  sodium chloride flush (NS) 0.9 % injection 3 mL (3 mLs Intravenous Not Given 03/07/19 1139)  sodium chloride 0.9 % bolus 1,000 mL (0 mLs Intravenous Stopped 03/07/19 1240)    And  sodium chloride 0.9 % bolus 500 mL (has no administration in time range)  ceFEPIme (MAXIPIME) 2 g in sodium chloride 0.9 % 100 mL IVPB (has no administration in time range)  vancomycin (VANCOCIN) IVPB 1000 mg/200 mL premix (has no administration in time range)  enoxaparin (LOVENOX) injection 40 mg (has no administration in time range)  sodium chloride flush (NS) 0.9 % injection 3 mL (3 mLs Intravenous Not Given 03/07/19 1501)  acetaminophen (TYLENOL) tablet 650 mg (has no administration in time range)    Or  acetaminophen (TYLENOL) suppository 650 mg (has no administration in time range)  ondansetron (ZOFRAN) tablet 4 mg (has no administration in time range)    Or  ondansetron (ZOFRAN) injection 4 mg (has no administration in time range)  guaiFENesin (MUCINEX) 12 hr tablet 600 mg (has no administration in time range)  levothyroxine (SYNTHROID) tablet 75 mcg (has no administration in time range)  Fluticasone-Umeclidin-Vilant 100-62.5-25 MCG/INH AEPB 1 puff (has no administration in time range)  ziprasidone (GEODON) capsule 40 mg (has no administration in time range)  venlafaxine XR (EFFEXOR-XR) 24 hr capsule 150 mg (has no administration in time range)   simvastatin (ZOCOR) tablet 40 mg (has no administration in time range)  pantoprazole (PROTONIX) EC tablet 40 mg (has no administration in time range)  gabapentin (NEURONTIN) tablet 1,200 mg (has no administration in time range)  albuterol (PROVENTIL) (2.5 MG/3ML) 0.083% nebulizer solution 2.5 mg (has no administration in time range)  nystatin (MYCOSTATIN/NYSTOP) topical powder 1 g (has no administration in time range)  sodium chloride 0.9 % bolus 1,000 mL (0 mLs Intravenous Stopped 03/07/19 1353)  albuterol (VENTOLIN HFA) 108 (90 Base) MCG/ACT inhaler 2 puff (2 puffs Inhalation Given 03/07/19 1212)  ceFEPIme (MAXIPIME) 2 g in sodium chloride 0.9 % 100 mL IVPB (0 g Intravenous Stopped 03/07/19 1242)  vancomycin (VANCOCIN) 1,500 mg in sodium chloride 0.9 % 500 mL IVPB (1,500 mg Intravenous New Bag/Given 03/07/19 1309)     Initial Impression / Assessment and Plan / ED Course  I have reviewed the triage vital signs and the nursing notes.  Pertinent labs & imaging results that were available during my care of the patient were reviewed by me and considered in my medical decision making (see chart for details).        Karla Clark is a 73 y.o. female with a past medical history significant for diabetes, COPD, hypertension, hypothyroidism, anxiety, osteoarthritis, and rheumatoid arthritis who presents with chills, cough, shortness of breath, and hypoxia.  Patient reports that for the last year that she has had increasing cough and shortness of breath.  She reports she is coughed up thick white sputum.  She is unsure of any sick contacts but was found to have oxygen saturations in the 80s on oxygen.  Patient is now on 4 L to improve her oxygen saturations.  She does not take oxygen at home.  Patient was found  to be hypotensive with EMS with a blood pressure in the 70s.  She was also confused from her baseline.  Patient given 500 of normal saline by EMS with some improvement in her blood pressure up to  the 90s on arrival.  Patient denies any urinary symptoms or GI symptoms.  She denies any chest pain or palpitations.  She denies any new leg pain or leg swelling.  No history of DVT or PE.  On exam, patient's right lung is extremely rhonchorous compared to the left.  Patient has small amount of wheezing and will be given some albuterol.  Chest and abdomen nontender.  Legs have no significant edema.  Patient has some deformity due to rheumatoid arthritis.  EKG shows no STEMI.  X-ray shows concern for multifocal pneumonia.  Given the patient's hypotension, tachypnea, hypoxia, subjective chills, leukocytosis, and pneumonia on x-ray, patient was made a code sepsis and will be given broad-spectrum antibiotics.  She will be given fluids for her hypotension as well.  Patient is awaiting results of other labs and Covid test prior to admission.  Anticipate admission for the multifocal pneumonia sepsis.  1:29 PM Covid test is negative.  Patient be admitted for sepsis from pneumonia.    Final Clinical Impressions(s) / ED Diagnoses   Final diagnoses:  Hypotension, unspecified hypotension type  Multifocal pneumonia  Sepsis, due to unspecified organism, unspecified whether acute organ dysfunction present [facility])     Clinical Impression: 1. Hypotension, unspecified hypotension type   2. Multifocal pneumonia   3. Sepsis, due to unspecified organism, unspecified whether acute organ dysfunction present [facility])     Disposition: Admit  This note was prepared with assistance of Dragon voice recognition software. Occasional wrong-word or sound-a-like substitutions may have occurred due to the inherent limitations of voice recognition software.     Hue Steveson, Gwenyth Allegra, MD 03/07/19 [phone]

## 2019-03-07 NOTE — ED Triage Notes (Signed)
Per EMS- pt here from home for eval of altered mental status/hypotension of 53X systolic. Given 59ml by EMS. Upon triage,. Pt 88% on 2L O2. O2 increased to 4L, sats improved to 99%.

## 2019-03-08 LAB — CBC
HCT: 26.3 % — ABNORMAL LOW (ref 36.0–46.0)
Hemoglobin: 8 g/dL — ABNORMAL LOW (ref 12.0–15.0)
MCH: 26.3 pg (ref 26.0–34.0)
MCHC: 30.4 g/dL (ref 30.0–36.0)
MCV: 86.5 fL (ref 80.0–100.0)
Platelets: 206 10*3/uL (ref 150–400)
RBC: 3.04 MIL/uL — ABNORMAL LOW (ref 3.87–5.11)
RDW: 18.5 % — ABNORMAL HIGH (ref 11.5–15.5)
WBC: 14.7 10*3/uL — ABNORMAL HIGH (ref 4.0–10.5)
nRBC: 0 % (ref 0.0–0.2)

## 2019-03-08 LAB — BASIC METABOLIC PANEL
Anion gap: 6 (ref 5–15)
BUN: 19 mg/dL (ref 8–23)
CO2: 18 mmol/L — ABNORMAL LOW (ref 22–32)
Calcium: 7.6 mg/dL — ABNORMAL LOW (ref 8.9–10.3)
Chloride: 112 mmol/L — ABNORMAL HIGH (ref 98–111)
Creatinine, Ser: 0.81 mg/dL (ref 0.44–1.00)
GFR calc Af Amer: >60 mL/min (ref >60)
GFR calc non Af Amer: >60 mL/min (ref >60)
Glucose, Bld: 152 mg/dL — ABNORMAL HIGH (ref 70–99)
Potassium: 4.8 mmol/L (ref 3.5–5.1)
Sodium: 136 mmol/L (ref 135–145)

## 2019-03-08 LAB — URINE CULTURE: Culture: <10000 — AB

## 2019-03-08 LAB — MRSA PCR SCREENING: MRSA by PCR: NEGATIVE

## 2019-03-08 LAB — GLUCOSE, CAPILLARY
Glucose-Capillary: 157 mg/dL — ABNORMAL HIGH (ref 70–99)
Glucose-Capillary: 241 mg/dL — ABNORMAL HIGH (ref 70–99)
Glucose-Capillary: 255 mg/dL — ABNORMAL HIGH (ref 70–99)
Glucose-Capillary: 124 mg/dL — ABNORMAL HIGH (ref 70–99)

## 2019-03-08 LAB — HEMOGLOBIN A1C
Hgb A1c MFr Bld: 5.9 % — ABNORMAL HIGH (ref 4.8–5.6)
Mean Plasma Glucose: 122.63 mg/dL

## 2019-03-08 MED ORDER — IPRATROPIUM-ALBUTEROL 0.5-2.5 (3) MG/3ML IN SOLN
3.0000 mL | Freq: Four times a day (QID) | RESPIRATORY_TRACT | Status: DC
  Administered 2019-03-08 – 2019-03-11 (×12): 3 mL via RESPIRATORY_TRACT
  Filled 2019-03-08 (×13): qty 3

## 2019-03-08 MED ORDER — SODIUM CHLORIDE 0.9 % IV SOLN
INTRAVENOUS | Status: DC | PRN
  Administered 2019-03-08: 1000 mL via INTRAVENOUS

## 2019-03-08 MED ORDER — VANCOMYCIN HCL IN DEXTROSE 750-5 MG/150ML-% IV SOLN
750.0000 mg | INTRAVENOUS | Status: DC
  Administered 2019-03-08 – 2019-03-09 (×2): 750 mg via INTRAVENOUS
  Filled 2019-03-08 (×2): qty 150

## 2019-03-08 MED ORDER — SODIUM CHLORIDE 0.9 % IV BOLUS
250.0000 mL | Freq: Once | INTRAVENOUS | Status: AC
  Administered 2019-03-08: 250 mL via INTRAVENOUS

## 2019-03-08 NOTE — Evaluation (Signed)
Physical Therapy Evaluation Patient Details Name: Karla Clark MRN: [id number] DOB: 03-Apr-1946 Today's Date: 03/08/2019   History of Present Illness  73 y.o. female with a past medical history significant for diabetes, COPD, hypertension, hypothyroidism, anxiety, osteoarthritis, and rheumatoid arthritis who presents with chills, cough, shortness of breath, and hypoxia.  Reports of inreased cough and SoB for last year. Pt admitted 02/04/19 for treatment of sepsis secondary to PNA, respiratory failure withhypoxia and UTI.  Clinical Impression  PTA pt living with daughter in single story home with ramped entrance. Pt with varying reports of prior level of mobility from transfer to wc to ambulation with RW. Pt report needing assist with ADLs and iADLs. Pt is currently limited in safe mobility by decreased cognition, in presence of decreased strength, ROM and endurance. Pt currently requires minA for bed mobility, transfers and ambulation of 2 feet with RW. PT recommending HHPT level rehab at discharge. PT will continue to follow acutely.     Follow Up Recommendations Home health PT;Supervision/Assistance - 24 hour    Equipment Recommendations  None recommended by PT    Recommendations for Other Services OT consult     Precautions / Restrictions Precautions Precautions: Fall Restrictions Weight Bearing Restrictions: No      Mobility  Bed Mobility Overal bed mobility: Needs Assistance Bed Mobility: Rolling;Supine to Sit;Sit to Supine Rolling: Min assist   Supine to sit: Min assist Sit to supine: Min assist   General bed mobility comments: min A for pad scoot of hips to EoB, pt able to bring LE off bed and trunk to upright, requires assist with squaring hips in bed with return to supine  Transfers Overall transfer level: Needs assistance Equipment used: Rolling walker (2 wheeled) Transfers: Sit to/from Stand Sit to Stand: Min assist         General transfer comment: min A  for power up and steadying with RW  Ambulation/Gait Ambulation/Gait assistance: Min Web designer (Feet): 2 Feet Assistive device: Rolling walker (2 wheeled) Gait Pattern/deviations: Step-to pattern;Decreased step length - right;Decreased step length - left;Trunk flexed Gait velocity: slowed Gait velocity interpretation: <1.31 ft/sec, indicative of household ambulator General Gait Details: min A for steadying with lateral steps to HoB        Balance Overall balance assessment: Needs assistance Sitting-balance support: Feet unsupported;No upper extremity supported;Feet supported;Single extremity supported Sitting balance-Leahy Scale: Fair     Standing balance support: During functional activity;Bilateral upper extremity supported Standing balance-Leahy Scale: Poor Standing balance comment: requires UE support for steadying in standing                             Pertinent Vitals/Pain Pain Assessment: Faces Faces Pain Scale: Hurts little more Pain Location: bilateral knees with movement and weightbearing Pain Descriptors / Indicators: Grimacing;Guarding Pain Intervention(s): Limited activity within patient's tolerance;Monitored during session;Repositioned    Home Living Family/patient expects to be discharged to:: Private residence Living Arrangements: Children Available Help at Discharge: Available PRN/intermittently;Family Type of Home: House Home Access: Ramped entrance     Home Layout: One level Home Equipment: Alex - 2 wheels;Bedside commode;Wheelchair - manual      Prior Function Level of Independence: Needs assistance   Gait / Transfers Assistance Needed: pt alternately states she does not walk and uses wc and then that she walks with RW  ADL's / Homemaking Assistance Needed: needs assist with bathing and dressing as well as iADLs  Extremity/Trunk Assessment   Upper Extremity Assessment Upper Extremity Assessment: RUE  deficits/detail;LUE deficits/detail RUE Deficits / Details: decreased ROM in fingers and hands, lacks full shoulder flexion RUE Coordination: decreased fine motor LUE Deficits / Details: decreased ROM in fingers and hands, lacks full shoulder flexion LUE Coordination: decreased fine motor    Lower Extremity Assessment Lower Extremity Assessment: RLE deficits/detail;LLE deficits/detail RLE Deficits / Details: ROM WFL, pain with weightbearing, strength grossly 3+/5 RLE Sensation: WNL RLE Coordination: decreased fine motor LLE Deficits / Details: lacking full flex/ext in hip and knee due to pain with movement, strength in available range grossly 3+/5 LLE Coordination: decreased fine motor    Cervical / Trunk Assessment Cervical / Trunk Assessment: Kyphotic  Communication   Communication: No difficulties  Cognition Arousal/Alertness: Awake/alert Behavior During Therapy: WFL for tasks assessed/performed Overall Cognitive Status: History of cognitive impairments - at baseline                                 General Comments: pt with alternating stories of PLOF, can't recall when she was last walking with RW      General Comments General comments (skin integrity, edema, etc.): Pt on 2L O2 via [place], SaO2 >90%O2 throughout session, VSS        Assessment/Plan    PT Assessment Patient needs continued PT services  PT Problem List Decreased range of motion;Decreased strength;Decreased activity tolerance;Decreased balance;Decreased mobility;Decreased cognition;Decreased coordination;Cardiopulmonary status limiting activity;Pain       PT Treatment Interventions DME instruction;Gait training;Therapeutic activities;Functional mobility training;Therapeutic exercise;Balance training;Cognitive remediation;Patient/family education    PT Goals (Current goals can be found in the Care Plan section)  Acute Rehab PT Goals Patient Stated Goal: feel better PT Goal Formulation: With  patient Time For Goal Achievement: 03/22/19 Potential to Achieve Goals: Good    Frequency Min 3X/week    AM-PAC PT "6 Clicks" Mobility  Outcome Measure Help needed turning from your back to your side while in a flat bed without using bedrails?: A Little Help needed moving from lying on your back to sitting on the side of a flat bed without using bedrails?: A Little Help needed moving to and from a bed to a chair (including a wheelchair)?: A Little Help needed standing up from a chair using your arms (e.g., wheelchair or bedside chair)?: A Little Help needed to walk in hospital room?: A Lot Help needed climbing 3-5 steps with a railing? : Total 6 Click Score: 15    End of Session Equipment Utilized During Treatment: Gait belt;Oxygen Activity Tolerance: Patient tolerated treatment well Patient left: in bed;with call bell/phone within reach;Other (comment)(set up for breakfast) Nurse Communication: Mobility status PT Visit Diagnosis: Unsteadiness on feet (R26.81);Other abnormalities of gait and mobility (R26.89);Muscle weakness (generalized) (M62.81);Difficulty in walking, not elsewhere classified (R26.2);Pain Pain - Right/Left: (bilateral ) Pain - part of body: Knee    Time: 0354-6568 PT Time Calculation (min) (ACUTE ONLY): 27 min   Charges:   PT Evaluation $PT Eval Moderate Complexity: 1 Mod PT Treatments $Therapeutic Activity: 8-22 mins        Carmelina Balducci B. Beverely Risen PT, DPT Acute Rehabilitation Services Pager [phone] Office [phone]   Elon Alas Fleet 03/08/2019, 10:00 AM

## 2019-03-08 NOTE — Progress Notes (Signed)
PROGRESS NOTE    Karla Clark  RAX:[id number] DOB: [birth year] DOA: 03/07/2019 PCP: Charlott Rakes, MD   Brief Narrative:  Patient is a 70 female with history of hypertension, hyperlipidemia, COPD, diabetes type 2, pneumonia, remote arthritis, hypothyroidism, anxiety, schizophrenia who was brought to the emergency department due to altered mental status.  At baseline, patient is not very mobile due to her rheumatoid arthritis.  On presentation she was found to be altered, hypotensive, hypoxic.  She had leukocytosis, acute kidney injury and lactic acid of 2.6.  Chest x-ray showed multifocal pneumonia.  Covid test was negative.  Started on broad start antibiotics and admitted for further management.  Assessment & Plan:   Principal Problem:   Sepsis due to pneumonia [facility]) Active Problems:   Rheumatoid arthritis with rheumatoid factor of multiple sites without organ or systems involvement (HCC)   Acute lower UTI   Acute on chronic respiratory failure with hypoxia (HCC)   COPD with acute exacerbation (HCC)   Acute metabolic encephalopathy   Transient hypotension   AKI (acute kidney injury) (HCC)   Sepsis present on admission: Was hypotensive, tachycardic on presentation.  Elevated lactic acid, leukocytosis.  Continue broad-spectrum antibiotics for now.  Lactic acidosis resolved.  Fluids stopped.  Follow-up cultures  Acute respiratory failure with hypoxia: Could be combination of multifocal pneumonia, COPD exacerbation.  Continue antibiotics, bronchodilators.  Currently on 4 L of oxygen by nasal cannula.  Not on oxygen at home.  Multifocal pneumonia: Multifocal bilateral airspace process worse over the right lung likely multifocal pneumonia with suggestion of small right effusion.  Continue current  antibiotics.  She had wheezes and crackles this morning.  Discontinue IV fluids.  Will check sputum culture. MRSA screen negative.  Will consider discontinuing vancomycin soon. Aspiration  pneumonia is also possibility.  Will request  for speech therapy evaluation.  COPD exacerbation: Was wheezing this morning.  Continue steroids.  She was previously on oxygen at home but not using currently.  Suspected UTI: Urinalysis was suspicious for UTI.  Follow-up urine culture.  Continue current antibiotics  Acute metabolic encephalopathy: Could be secondary to sepsis, acute respiratory failure.  Mental status much improved.  Currently she is alert and awake and oriented  AKI: Resolved with IV fluids  Normocytic anemia: Hemoglobin currently stable.  Continue to monitor.  Hypothyroidism: Continue Synthyroid  DM type 2:On metformin, Actos at home.  Continue sliding scale insulin for now.  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  Mostly bedbound due to her arthritis.  Requesting PT/OT evaluation          DVT prophylaxis: Lovenox Code Status: Full Family Communication:  Disposition Plan: Undetermined at this point   Consultants: None  Procedures:None  Antimicrobials:  Anti-infectives (From admission, onward)   Start     Dose/Rate Route Frequency Ordered Stop   03/09/19 1300  vancomycin (VANCOCIN) IVPB 1000 mg/200 mL premix     1,000 mg 200 mL/hr over 60 Minutes Intravenous Every 48 hours 03/07/19 1236     03/08/19 0000  ceFEPIme (MAXIPIME) 2 g in sodium chloride 0.9 % 100 mL IVPB     2 g 200 mL/hr over 30 Minutes Intravenous Every 12 hours 03/07/19 1236     03/07/19 1230  vancomycin (VANCOCIN) 1,500 mg in sodium chloride 0.9 % 500 mL IVPB     1,500 mg 250 mL/hr over 120 Minutes Intravenous  Once 03/07/19 1217 03/07/19 1641   03/07/19 1200  vancomycin (VANCOCIN) IVPB 1000 mg/200 mL  premix  Status:  Discontinued     1,000 mg 200 mL/hr over 60 Minutes Intravenous  Once 03/07/19 1152 03/07/19 1217   03/07/19 1200  ceFEPIme (MAXIPIME) 2 g in sodium chloride 0.9 % 100 mL IVPB     2 g 200 mL/hr over 30  Minutes Intravenous  Once 03/07/19 1152 03/07/19 1242      Subjective:  Patient seen and examined the bedside this morning.  Hemodynamically stable.  Looks comfortable this morning.  Was not in any kind of respiratory distress.  Was eating her breakfast.  Denies any shortness of breath or chest pain.  Alert, awake, tells current month.   Objective: Vitals:   03/08/19 0500 03/08/19 0600 03/08/19 0755 03/08/19 0800  BP: (!) 146/59 (!) 111/48  114/67  Pulse: 95 86 88 86  Resp: 16 17 20 18   Temp:    97.7 F (36.5 C)  TempSrc:    Oral  SpO2: 98% 98%  100%  Weight:      Height:        Intake/Output Summary (Last 24 hours) at 03/08/2019 1056 Last data filed at 03/08/2019 0900 Gross per 24 hour  Intake 4733.1 ml  Output 200 ml  Net 4533.1 ml   Filed Weights   03/07/19 1200  Weight: 77.1 kg    Examination:  General exam: Deconditioned/debilitated, obese  HEENT:PERRL,Oral mucosa moist, Ear/Nose normal on gross exam Respiratory system: Bilateral decreased air entry, scattered wheezes, rhonchi Cardiovascular system: S1 & S2 heard, RRR. No JVD, murmurs, rubs, gallops or clicks. No pedal edema. Gastrointestinal system: Abdomen is nondistended, soft and nontender. No organomegaly or masses felt. Normal bowel sounds heard. Central nervous system: Alert and oriented. No focal neurological deficits. Extremities: No edema, no clubbing ,no cyanosis, distal peripheral pulses palpable. Skin: No rashes, lesions or ulcers,no icterus ,no pallor   Data Reviewed: I have personally reviewed following labs and imaging studies  CBC: Recent Labs  Lab 03/07/19 1123 03/08/19 0346  WBC 17.8* 14.7*  NEUTROABS 16.2*  --   HGB 8.8* 8.0*  HCT 28.7* 26.3*  MCV 86.2 86.5  PLT 230 206   Basic Metabolic Panel: Recent Labs  Lab 03/07/19 1123 03/08/19 0346  NA 135 136  K 4.0 4.8  CL 104 112*  CO2 21* 18*  GLUCOSE 150* 152*  BUN 21 19  CREATININE 1.43* 0.81  CALCIUM 8.2* 7.6*   GFR:  Estimated Creatinine Clearance: 58.6 mL/min (by C-G formula based on SCr of 0.81 mg/dL). Liver Function Tests: Recent Labs  Lab 03/07/19 1123  AST 23  ALT 11  ALKPHOS 57  BILITOT 0.4  PROT 5.3*  ALBUMIN 2.7*   Recent Labs  Lab 03/07/19 1203  LIPASE 71*   No results for input(s): AMMONIA in the last 168 hours. Coagulation Profile: Recent Labs  Lab 03/07/19 1203  INR 1.1   Cardiac Enzymes: No results for input(s): CKTOTAL, CKMB, CKMBINDEX, TROPONINI in the last 168 hours. BNP (last 3 results) No results for input(s): PROBNP in the last 8760 hours. HbA1C: Recent Labs    03/08/19 0346  HGBA1C 5.9*   CBG: Recent Labs  Lab 03/07/19 2040 03/08/19 0810  GLUCAP 124* 157*   Lipid Profile: No results for input(s): CHOL, HDL, LDLCALC, TRIG, CHOLHDL, LDLDIRECT in the last 72 hours. Thyroid Function Tests: Recent Labs    03/07/19 2107  TSH 1.118   Anemia Panel: No results for input(s): VITAMINB12, FOLATE, FERRITIN, TIBC, IRON, RETICCTPCT in the last 72 hours. Sepsis Labs: Recent Labs  Lab 03/07/19 1123 03/07/19 1430 03/07/19 1444  PROCALCITON  --  25.11  --   LATICACIDVEN 2.6*  --  1.6    Recent Results (from the past 240 hour(s))  SARS Coronavirus 2 by RT PCR (hospital order, performed in [facility] hospital lab) Nasopharyngeal Nasopharyngeal Swab     Status: None   Collection Time: 03/07/19 12:12 PM   Specimen: Nasopharyngeal Swab  Result Value Ref Range Status   SARS Coronavirus 2 NEGATIVE NEGATIVE Final    Comment: (NOTE) If result is NEGATIVE SARS-CoV-2 target nucleic acids are NOT DETECTED. The SARS-CoV-2 RNA is generally detectable in upper and lower  respiratory specimens during the acute phase of infection. The lowest  concentration of SARS-CoV-2 viral copies this assay can detect is 250  copies / mL. A negative result does not preclude SARS-CoV-2 infection  and should not be used as the sole basis for treatment or other  patient management  decisions.  A negative result may occur with  improper specimen collection / handling, submission of specimen other  than nasopharyngeal swab, presence of viral mutation(s) within the  areas targeted by this assay, and inadequate number of viral copies  (<250 copies / mL). A negative result must be combined with clinical  observations, patient history, and epidemiological information. If result is POSITIVE SARS-CoV-2 target nucleic acids are DETECTED. The SARS-CoV-2 RNA is generally detectable in upper and lower  respiratory specimens dur ing the acute phase of infection.  Positive  results are indicative of active infection with SARS-CoV-2.  Clinical  correlation with patient history and other diagnostic information is  necessary to determine patient infection status.  Positive results do  not rule out bacterial infection or co-infection with other viruses. If result is PRESUMPTIVE POSTIVE SARS-CoV-2 nucleic acids MAY BE PRESENT.   A presumptive positive result was obtained on the submitted specimen  and confirmed on repeat testing.  While 2019 novel coronavirus  (SARS-CoV-2) nucleic acids may be present in the submitted sample  additional confirmatory testing may be necessary for epidemiological  and / or clinical management purposes  to differentiate between  SARS-CoV-2 and other Sarbecovirus currently known to infect humans.  If clinically indicated additional testing with an alternate test  methodology [id number]) is advised. The SARS-CoV-2 RNA is generally  detectable in upper and lower respiratory sp ecimens during the acute  phase of infection. The expected result is Negative. Fact Sheet for Patients:  BoilerBrush.com.cy Fact Sheet for Healthcare Providers: https://pope.com/ This test is not yet approved or cleared by the [place] FDA and has been authorized for detection and/or diagnosis of SARS-CoV-2 by FDA under an  Emergency Use Authorization (EUA).  This EUA will remain in effect (meaning this test can be used) for the duration of the COVID-19 declaration under Section 564(b)(1) of the Act, 21 U.S.C. section 360bbb-3(b)(1), unless the authorization is terminated or revoked sooner. Performed at [facility], [address], [place], [place] [address]   Blood culture (routine x 2)     Status: None (Preliminary result)   Collection Time: 03/07/19 12:53 PM   Specimen: BLOOD RIGHT WRIST  Result Value Ref Range Status   Specimen Description BLOOD RIGHT WRIST  Final   Special Requests   Final    BOTTLES DRAWN AEROBIC AND ANAEROBIC Blood Culture adequate volume   Culture   Final    NO GROWTH < 12 HOURS Performed at [facility], [address], [place], [place] [address]  Report Status PENDING  Incomplete  Blood culture (routine x 2)     Status: None (Preliminary result)   Collection Time: 03/07/19 12:53 PM   Specimen: BLOOD LEFT WRIST  Result Value Ref Range Status   Specimen Description BLOOD LEFT WRIST  Final   Special Requests   Final    BOTTLES DRAWN AEROBIC AND ANAEROBIC Blood Culture results may not be optimal due to an inadequate volume of blood received in culture bottles   Culture   Final    NO GROWTH < 12 HOURS Performed at [facility], [place], [place], [place] [address]    Report Status PENDING  Incomplete  MRSA PCR Screening     Status: None   Collection Time: 03/07/19  8:34 PM   Specimen: Nasal Mucosa; Nasopharyngeal  Result Value Ref Range Status   MRSA by PCR NEGATIVE NEGATIVE Final    Comment:        The GeneXpert MRSA Assay (FDA approved for NASAL specimens only), is one component of a comprehensive MRSA colonization surveillance program. It is not intended to diagnose MRSA infection nor to guide or monitor treatment for MRSA infections. Performed at [facility], [place] [address] [place], [place] [address]          Radiology  Studies: Dg Chest Portable 1 View  Result Date: 03/07/2019 CLINICAL DATA:  Shortness of breath and altered mental status. EXAM: PORTABLE CHEST 1 VIEW COMPARISON:  10/18/2018 FINDINGS: Lungs are adequately inflated demonstrate new multifocal airspace process over the right lung and left base/retrocardiac region likely multifocal pneumonia. Suggestion of a small amount right pleural fluid. Cardiomediastinal silhouette and remainder of the exam is unchanged. IMPRESSION: Multifocal bilateral airspace process worse over the right lung likely multifocal pneumonia with suggestion of small right effusion. Electronically Signed   By: Marin Olp M.D.   On: 03/07/2019 11:47        Scheduled Meds: . enoxaparin (LOVENOX) injection  40 mg Subcutaneous Q24H  . fluticasone furoate-vilanterol  1 puff Inhalation Daily  . gabapentin  1,200 mg Oral BID  . guaiFENesin  600 mg Oral BID  . insulin aspart  0-15 Units Subcutaneous TID WC  . insulin aspart  0-5 Units Subcutaneous QHS  . ipratropium-albuterol  3 mL Nebulization Q6H  . levothyroxine  75 mcg Oral Q0600  . methylPREDNISolone (SOLU-MEDROL) injection  60 mg Intravenous Q12H  . pantoprazole  40 mg Oral Daily  . simvastatin  40 mg Oral q1800  . sodium chloride flush  3 mL Intravenous Once  . sodium chloride flush  3 mL Intravenous Q12H  . venlafaxine XR  150 mg Oral q morning - 10a  . ziprasidone  40 mg Oral QHS   Continuous Infusions: . ceFEPime (MAXIPIME) IV 2 g (03/07/19 2319)  . [START ON 03/09/2019] vancomycin       LOS: 1 day    Time spent: More than 50% of that time was spent in counseling and/or coordination of care.      Shelly Coss, MD [facility] Pager [phone]  If 7PM-7AM, please contact night-coverage [email or web address] Password [email or web address] 03/08/2019, 10:56 AM

## 2019-03-08 NOTE — Evaluation (Signed)
Occupational Therapy Evaluation Patient Details Name: Karla Clark MRN: [id number] DOB: 09/12/1945 Today's Date: 03/08/2019    History of Present Illness 73 y.o. female with a past medical history significant for diabetes, COPD, hypertension, hypothyroidism, anxiety, osteoarthritis, and rheumatoid arthritis who presents with chills, cough, shortness of breath, and hypoxia.  Reports of inreased cough and SoB for last year. Pt admitted 02/04/19 for treatment of sepsis secondary to PNA, respiratory failure withhypoxia and UTI.   Clinical Impression   Pt admitted with the above diagnoses and presents with below problem list. Pt will benefit from continued acute OT to address the below listed deficits and maximize independence with basic ADLs prior to d/c home. PTA it seems pt was needing some assist with bathing and dressing. She reports to this OT that she utilizes a w/c to get to the bathroom at home, "someone pushes me in my wheelchair." Pt also reports that between daughter and [facility] aide someone is usually present with her. No family present to confirm this. Pt is currently min to mod A with UB/LB ADLs. Sidestepped along EOB with min guard to min A.      Follow Up Recommendations  Home health OT;Supervision/Assistance - 24 hour    Equipment Recommendations  None recommended by OT    Recommendations for Other Services       Precautions / Restrictions Precautions Precautions: Fall Restrictions Weight Bearing Restrictions: No      Mobility Bed Mobility Overal bed mobility: Needs Assistance Bed Mobility: Supine to Sit;Sit to Supine     Supine to sit: Min guard;HOB elevated Sit to supine: Mod assist   General bed mobility comments: extra time and effort to come to EOB but able to do so without physical assist this session. Assist needed to advance BLE up onto bed. Pt reports she has an adjustable bed at [employer] Overall transfer level: Needs assistance Equipment used:  Rolling walker (2 wheeled) Transfers: Sit to/from Stand Sit to Stand: Min assist         General transfer comment: min A for power up and steadying with RW    Balance Overall balance assessment: Needs assistance Sitting-balance support: Feet unsupported;No upper extremity supported;Feet supported;Single extremity supported Sitting balance-Leahy Scale: Fair     Standing balance support: During functional activity;Bilateral upper extremity supported Standing balance-Leahy Scale: Poor Standing balance comment: requires UE support for steadying in standing                           ADL either performed or assessed with clinical judgement   ADL Overall ADL's : Needs assistance/impaired Eating/Feeding: Set up Eating/Feeding Details (indicate cue type and reason): needs assist to open containers and pour can drink into cup. noted to use only R hand during feedinf tasks. Did not observe bilateral tasks this session.  Grooming: Moderate assistance;Sitting   Upper Body Bathing: Moderate assistance;Sitting   Lower Body Bathing: Moderate assistance;Sit to/from stand   Upper Body Dressing : Moderate assistance;Sitting   Lower Body Dressing: Moderate assistance;Sit to/from stand   Toilet Transfer: Minimal assistance;Stand-pivot;BSC;RW Toilet Transfer Details (indicate cue type and reason): clinical judgement. pt sidestepped each direction along EOB. Toileting- Clothing Manipulation and Hygiene: Moderate assistance;Sit to/from stand         General ADL Comments: Pt completed bed mobility and sidestepped along EOB.      Vision         Perception     Praxis  Pertinent Vitals/Pain Pain Assessment: Faces Faces Pain Scale: Hurts little more Pain Location: bilateral knees with movement and weightbearing Pain Descriptors / Indicators: Grimacing;Guarding Pain Intervention(s): Monitored during session;Limited activity within patient's tolerance     Hand Dominance  Right   Extremity/Trunk Assessment Upper Extremity Assessment Upper Extremity Assessment: RUE deficits/detail;LUE deficits/detail RUE Deficits / Details: decreased ROM in fingers and hands, lacks full shoulder flexion RUE Coordination: decreased fine motor LUE Deficits / Details: decreased ROM in fingers and hands, lacks full shoulder flexion LUE Coordination: decreased fine motor   Lower Extremity Assessment Lower Extremity Assessment: Defer to PT evaluation       Communication Communication Communication: No difficulties   Cognition Arousal/Alertness: Awake/alert Behavior During Therapy: Flat affect Overall Cognitive Status: History of cognitive impairments - at baseline                                     General Comments  Pt received in bed eating lunch. Pt's [place] noted to be in lap with sats in mid 80s and telemetry alarm ringing. Cued to place O2 back on "oh I forgot that was there." Sats on 2L quickly stabilized to low 90s.    Exercises     Shoulder Instructions      Home Living Family/patient expects to be discharged to:: Private residence Living Arrangements: Children Available Help at Discharge: Available PRN/intermittently;Family Type of Home: House Home Access: Ramped entrance     Home Layout: One level     Bathroom Shower/Tub: Producer, television/film/video: Standard Bathroom Accessibility: Yes   Home Equipment: Environmental consultant - 2 wheels;Bedside commode;Wheelchair - manual;Hand held shower head          Prior Functioning/Environment Level of Independence: Needs assistance  Gait / Transfers Assistance Needed: pt states she uses w/c to get to bathroom "someone pushes me." Noted to have given conflicting reports to PT.  ADL's / Homemaking Assistance Needed: needs assist with bathing and dressing as well as iADLs   Comments: Pt reports she has an aide who stay with her when her daughter is not able to be at home.         OT Problem List:  Decreased strength;Decreased activity tolerance;Impaired balance (sitting and/or standing);Decreased knowledge of use of DME or AE;Decreased knowledge of precautions;Decreased safety awareness;Pain      OT Treatment/Interventions: Self-care/ADL training;Energy conservation;DME and/or AE instruction;Therapeutic activities;Cognitive remediation/compensation;Patient/family education;Balance training    OT Goals(Current goals can be found in the care plan section) Acute Rehab OT Goals Patient Stated Goal: feel better OT Goal Formulation: With patient Time For Goal Achievement: 03/22/19 Potential to Achieve Goals: Good ADL Goals Pt Will Perform Grooming: with min assist;sitting Pt Will Perform Upper Body Bathing: with min assist;sitting Pt Will Perform Lower Body Bathing: with min assist;sit to/from stand Pt Will Transfer to Toilet: bedside commode;stand pivot transfer;with min guard assist Pt Will Perform Toileting - Clothing Manipulation and hygiene: with min guard assist;sit to/from stand  OT Frequency: Min 2X/week   Barriers to D/C:            Co-evaluation              AM-PAC OT "6 Clicks" Daily Activity     Outcome Measure Help from another person eating meals?: A Little Help from another person taking care of personal grooming?: A Lot Help from another person toileting, which includes using toliet, bedpan, or urinal?: A Lot  Help from another person bathing (including washing, rinsing, drying)?: A Lot Help from another person to put on and taking off regular upper body clothing?: A Little Help from another person to put on and taking off regular lower body clothing?: A Lot 6 Click Score: 14   End of Session Equipment Utilized During Treatment: Rolling walker  Activity Tolerance: Patient tolerated treatment well;Patient limited by fatigue Patient left: in bed;with call bell/phone within reach;with bed alarm set  OT Visit Diagnosis: Unsteadiness on feet (R26.81);Other  abnormalities of gait and mobility (R26.89);Muscle weakness (generalized) (M62.81);Pain;Other symptoms and signs involving cognitive function                Time: 4742-5956 OT Time Calculation (min): 20 min Charges:  OT General Charges $OT Visit: 1 Visit OT Evaluation $OT Eval Low Complexity: [place], OT Acute Rehabilitation Services Pager: [phone] Office: [phone]   Karla Clark 03/08/2019, 1:12 PM

## 2019-03-08 NOTE — TOC Initial Note (Signed)
Transition of Care [facility]) - Initial/Assessment Note    Patient Details  Name: Karla Clark MRN: [id number] Date of Birth: 05/18/1945  [facility]) CM/SW Contact:    Cherylann Parr, RN Phone Number: 03/08/2019, 3:03 PM  Clinical Narrative:        PTA from home with daughter.  Daughter answered phone at bedside with pt.  Pt has PCP and daughter denied barrier with paying for medications at discharge.  CM requested to speak directly with pt - pt deferred questions to daughter.  CM informed of recommendation of HH - daughter/pt informed CM that they would think about it, "I don't think they can do anything else".  Per daughter pt was discharged from [facility] earlier this year as pt no longer needed services. Family will provide needed supervision at discharge.    [facility] department will follow up with pt closer to discharge to determine if [facility] is still needed or desired.            Expected Discharge Plan: Home w Home Health Services Barriers to Discharge: Continued Medical Work up   Patient Goals and CMS Choice        Expected Discharge Plan and Services Expected Discharge Plan: Home w Home Health Services       Living arrangements for the past 2 months: Single Family Home                                      Prior Living Arrangements/Services Living arrangements for the past 2 months: Single Family Home Lives with:: Adult Children          Need for Family Participation in Patient Care: Yes (Comment) Care giver support system in place?: Yes (comment) Current home services: DME Criminal Activity/Legal Involvement Pertinent to Current Situation/Hospitalization: No - Comment as needed  Activities of Daily Living   ADL Screening (condition at time of admission) Patient's cognitive ability adequate to safely complete daily activities?: Yes Is the patient deaf or have difficulty hearing?: No Does the patient have difficulty concentrating,  remembering, or making decisions?: Yes Patient able to express need for assistance with ADLs?: Yes Does the patient have difficulty dressing or bathing?: Yes Independently performs ADLs?: Yes (appropriate for developmental age) Does the patient have difficulty walking or climbing stairs?: Yes Weakness of Legs: Both Weakness of Arms/Hands: Both  Permission Sought/Granted                  Emotional Assessment              Admission diagnosis:  Hypotension, unspecified hypotension type [I95.9] Multifocal pneumonia [J18.9] Sepsis, due to unspecified organism, unspecified whether acute organ dysfunction present (HCC) [A41.9] Sepsis due to pneumonia (HCC) [J18.9, A41.9] Patient Active Problem List   Diagnosis Date Noted  . Sepsis due to pneumonia (HCC) 03/07/2019  . Acute lower UTI 03/07/2019  . Acute on chronic respiratory failure with hypoxia (HCC) 03/07/2019  . COPD with acute exacerbation (HCC) 03/07/2019  . Acute metabolic encephalopathy 03/07/2019  . Transient hypotension 03/07/2019  . AKI (acute kidney injury) (HCC) 03/07/2019  . Rheumatoid arthritis with rheumatoid factor of multiple sites without organ or systems involvement (HCC) 07/19/2016  . High risk medications (not anticoagulants) long-term use 07/19/2016  . Osteoarthritis of both knees 07/19/2016  . Osteoarthritis of both hands 07/19/2016  . Primary osteoarthritis of both feet 07/19/2016   PCP:  Maryella Shivers, MD Pharmacy:   [facility] [place], [place] - [address] AT [address] [address] [place] [address] Phone: [phone] Fax: [phone]     Social Determinants of Health (SDOH) Interventions    Readmission Risk Interventions No flowsheet data found.

## 2019-03-08 NOTE — Progress Notes (Signed)
Pharmacy Antibiotic Note  Karla Clark is a 73 y.o. female admitted on 03/07/2019 with sepsis.  Pharmacy has been consulted for vancomycin and cefepime dosing.   SCr is improving, down to 0.81 with estimated CrCl ~58 mL/min.  WBC is trending down. Patient is now afebrile. PCT elevated at 25.   MRSA PCR is negative - may be able to discontinue Vancomycin based on this alone.  MD noted in progress note - considering to discontinue soon.   Plan: Continue Cefepime 2gm IV every 12H - will adjust up to every 8 hours when CrCl > 60 mL/min.  Adjust Vancomycin for improve renal function to 750 mg IV every 24 hours.  Goal AUC 400-550. Expected AUC: 438 (using 0.5 VD due to BMI >30) SCr used: 0.81 F/u renal fxn, C&S, clinical status and peak/trough at SS *Likely discontinue vancomycin soon with negative MRSA pcr   Height: 5\' 2"  (157.5 cm) Weight: 170 lb (77.1 kg)(per last visit) IBW/kg (Calculated) : 50.1  Temp (24hrs), Avg:98.1 F (36.7 C), Min:97.7 F (36.5 C), Max:98.4 F (36.9 C)  Recent Labs  Lab 03/07/19 1123 03/07/19 1444 03/08/19 0346  WBC 17.8*  --  14.7*  CREATININE 1.43*  --  0.81  LATICACIDVEN 2.6* 1.6  --     Estimated Creatinine Clearance: 58.6 mL/min (by C-G formula based on SCr of 0.81 mg/dL).    Allergies  Allergen Reactions  . Ace Inhibitors Cough    Antimicrobials this admission: Vanc 11/4>> Cefepime 11/4>>  Dose adjustments this admission: N/A  Microbiology results: 11/4 MRSA pcr negative 11/4 BCx >> 11/4 UCx >> 11/4 COVID negative  Thank you for allowing pharmacy to be a part of this patient's care.  Brain Hilts 03/08/2019 1:11 PM

## 2019-03-09 ENCOUNTER — Inpatient Hospital Stay (HOSPITAL_COMMUNITY): Payer: Medicare Other

## 2019-03-09 LAB — FERRITIN: Ferritin: 62 ng/mL (ref 11–307)

## 2019-03-09 LAB — CBC WITH DIFFERENTIAL/PLATELET
Abs Immature Granulocytes: 0.13 10*3/uL — ABNORMAL HIGH (ref 0.00–0.07)
Basophils Absolute: 0 10*3/uL (ref 0.0–0.1)
Basophils Relative: 0 %
Eosinophils Absolute: 0 10*3/uL (ref 0.0–0.5)
Eosinophils Relative: 0 %
HCT: 24.8 % — ABNORMAL LOW (ref 36.0–46.0)
Hemoglobin: 7.5 g/dL — ABNORMAL LOW (ref 12.0–15.0)
Immature Granulocytes: 1 %
Lymphocytes Relative: 5 %
Lymphs Abs: 0.7 10*3/uL (ref 0.7–4.0)
MCH: 26 pg (ref 26.0–34.0)
MCHC: 30.2 g/dL (ref 30.0–36.0)
MCV: 85.8 fL (ref 80.0–100.0)
Monocytes Absolute: 0.5 10*3/uL (ref 0.1–1.0)
Monocytes Relative: 4 %
Neutro Abs: 11.9 10*3/uL — ABNORMAL HIGH (ref 1.7–7.7)
Neutrophils Relative %: 90 %
Platelets: 227 10*3/uL (ref 150–400)
RBC: 2.89 MIL/uL — ABNORMAL LOW (ref 3.87–5.11)
RDW: 18.9 % — ABNORMAL HIGH (ref 11.5–15.5)
WBC: 13.2 10*3/uL — ABNORMAL HIGH (ref 4.0–10.5)
nRBC: 0 % (ref 0.0–0.2)

## 2019-03-09 LAB — IRON AND TIBC
Iron: 20 ug/dL — ABNORMAL LOW (ref 28–170)
Saturation Ratios: 6 % — ABNORMAL LOW (ref 10.4–31.8)
TIBC: 329 ug/dL (ref 250–450)
UIBC: 309 ug/dL

## 2019-03-09 LAB — BASIC METABOLIC PANEL
Anion gap: 6 (ref 5–15)
BUN: 12 mg/dL (ref 8–23)
CO2: 19 mmol/L — ABNORMAL LOW (ref 22–32)
Calcium: 8.1 mg/dL — ABNORMAL LOW (ref 8.9–10.3)
Chloride: 111 mmol/L (ref 98–111)
Creatinine, Ser: 0.86 mg/dL (ref 0.44–1.00)
GFR calc Af Amer: >60 mL/min (ref >60)
GFR calc non Af Amer: >60 mL/min (ref >60)
Glucose, Bld: 154 mg/dL — ABNORMAL HIGH (ref 70–99)
Potassium: 4.4 mmol/L (ref 3.5–5.1)
Sodium: 136 mmol/L (ref 135–145)

## 2019-03-09 LAB — GLUCOSE, CAPILLARY
Glucose-Capillary: 140 mg/dL — ABNORMAL HIGH (ref 70–99)
Glucose-Capillary: 277 mg/dL — ABNORMAL HIGH (ref 70–99)
Glucose-Capillary: 278 mg/dL — ABNORMAL HIGH (ref 70–99)
Glucose-Capillary: 243 mg/dL — ABNORMAL HIGH (ref 70–99)

## 2019-03-09 MED ORDER — GUAIFENESIN-DM 100-10 MG/5ML PO SYRP
10.0000 mL | ORAL_SOLUTION | Freq: Four times a day (QID) | ORAL | Status: DC
  Administered 2019-03-09 – 2019-03-12 (×10): 10 mL via ORAL
  Filled 2019-03-09 (×9): qty 10

## 2019-03-09 MED ORDER — FUROSEMIDE 10 MG/ML IJ SOLN
40.0000 mg | Freq: Two times a day (BID) | INTRAMUSCULAR | Status: AC
  Administered 2019-03-09 (×2): 40 mg via INTRAVENOUS
  Filled 2019-03-09 (×2): qty 4

## 2019-03-09 MED ORDER — TRAZODONE HCL 50 MG PO TABS
50.0000 mg | ORAL_TABLET | Freq: Once | ORAL | Status: AC
  Administered 2019-03-09: 50 mg via ORAL
  Filled 2019-03-09: qty 1

## 2019-03-09 MED ORDER — RESOURCE THICKENUP CLEAR PO POWD
ORAL | Status: DC | PRN
  Filled 2019-03-09: qty 125

## 2019-03-09 NOTE — Progress Notes (Signed)
Pt's daughter reported that pt had spit out a pea - when asked about it, pt said it was from dinner which was over an hour before. Discussed pocketing with daughter.

## 2019-03-09 NOTE — Evaluation (Signed)
Clinical/Bedside Swallow Evaluation Patient Details  Name: Karla Clark MRN: [id number] Date of Birth: 1945-05-13  Today's Date: 03/09/2019 Time: SLP Start Time (ACUTE ONLY): 0951 SLP Stop Time (ACUTE ONLY): 1005 SLP Time Calculation (min) (ACUTE ONLY): 14 min  Past Medical History:  Past Medical History:  Diagnosis Date  . Anxiety   . COPD (chronic obstructive pulmonary disease) (HCC)   . Diabetes mellitus without complication (HCC)   . Elevated cholesterol   . Hypertension   . Hypothyroidism   . Osteoarthritis   . RA (rheumatoid arthritis) (HCC)   . Schizophrenia (HCC)    delusional   Past Surgical History:  Past Surgical History:  Procedure Laterality Date  . ABDOMINAL HYSTERECTOMY     HPI:  Patient is a 8 female with history of hypertension, hyperlipidemia, COPD, diabetes type 2, pneumonia, remote arthritis, hypothyroidism, anxiety, schizophrenia who was brought to the emergency department due to altered mental status.  At baseline, patient is not very mobile due to her rheumatoid arthritis.  On presentation she was found to be altered, hypotensive, hypoxic.  She had leukocytosis, acute kidney injury and lactic acid of 2.6.  Chest x-ray showed multifocal pneumonia.  Covid test was negative.  Started on broad start antibiotics and admitted for further management. Multifocal bilateral airspace process worse over the right lung likely multifocal pneumonia with suggestion of small right effusion.     Assessment / Plan / Recommendation Clinical Impression  Pt denies difficulty swallowing or breathing, but does present with concern for a respiratory based dysphagia. Pt is able to take 2-3 consecutive sips without coughing, but when pushed to complete the 3 oz water swallow pt clearly struggles with timing of breathing and swallowing and sustained airway closure and had immediate cough. Suspect pt is protecting her airway with adequate rate and can continue a regular diet and thin  liquids while admitted, but will proceed with instrumental assessment as well for further diagnostic assessment given pna.  SLP Visit Diagnosis: Dysphagia, oropharyngeal phase (R13.12)    Aspiration Risk  Moderate aspiration risk    Diet Recommendation Regular;Thin liquid   Liquid Administration via: Cup;Straw Medication Administration: Whole meds with liquid Supervision: Patient able to self feed Compensations: Minimize environmental distractions;Slow rate;Small sips/bites Postural Changes: Seated upright at 90 degrees    Other  Recommendations Oral Care Recommendations: Oral care BID   Follow up Recommendations 24 hour supervision/assistance      Frequency and Duration min 2x/week  2 weeks       Prognosis        Swallow Study   General HPI: Patient is a 58 female with history of hypertension, hyperlipidemia, COPD, diabetes type 2, pneumonia, remote arthritis, hypothyroidism, anxiety, schizophrenia who was brought to the emergency department due to altered mental status.  At baseline, patient is not very mobile due to her rheumatoid arthritis.  On presentation she was found to be altered, hypotensive, hypoxic.  She had leukocytosis, acute kidney injury and lactic acid of 2.6.  Chest x-ray showed multifocal pneumonia.  Covid test was negative.  Started on broad start antibiotics and admitted for further management. Multifocal bilateral airspace process worse over the right lung likely multifocal pneumonia with suggestion of small right effusion.   Type of Study: Bedside Swallow Evaluation Previous Swallow Assessment: none Diet Prior to this Study: Regular;Thin liquids Temperature Spikes Noted: No Respiratory Status: Nasal cannula History of Recent Intubation: No Behavior/Cognition: Alert;Cooperative;Pleasant mood Oral Cavity Assessment: Within Functional Limits Oral Care Completed by SLP: No Oral  Cavity - Dentition: Adequate natural dentition Vision: Functional for  self-feeding Self-Feeding Abilities: Able to feed self Patient Positioning: Upright in bed Baseline Vocal Quality: Breathy;Low vocal intensity Volitional Cough: Strong Volitional Swallow: Able to elicit    Oral/Motor/Sensory Function Overall Oral Motor/Sensory Function: Within functional limits   Ice Chips Ice chips: Not tested   Thin Liquid Thin Liquid: Impaired Presentation: Straw Pharyngeal  Phase Impairments: Suspected delayed Swallow;Cough - Immediate    Nectar Thick Nectar Thick Liquid: Within functional limits   Honey Thick Honey Thick Liquid: Within functional limits   Puree Puree: Within functional limits Presentation: Self Fed;Spoon   Solid     Solid: Within functional limits     Karla [place], MA [place] Pager [phone] Office [phone]  Karla Clark, Karla Clark 03/09/2019,10:13 AM

## 2019-03-09 NOTE — Care Management Important Message (Signed)
Important Message  Patient Details  Name: Karla Clark MRN: [id number] Date of Birth: 14-Aug-1945   Medicare Important Message Given:  Yes     Ferron Ishmael 03/09/2019, 2:26 PM

## 2019-03-09 NOTE — Progress Notes (Signed)
PROGRESS NOTE    Karla Clark  RUE:[id number] DOB: [birth year] DOA: 03/07/2019 PCP: Charlott RakesHodges, [place], MD   Brief Narrative:  Patient is a [age] female with history of hypertension, hyperlipidemia, COPD, diabetes type 2, pneumonia, remote arthritis, hypothyroidism, anxiety, schizophrenia who was brought to the emergency department due to altered mental status.  At baseline, patient is not very mobile due to her rheumatoid arthritis.  On presentation she was found to be altered, hypotensive, hypoxic.  She had leukocytosis, acute kidney injury and lactic acid of 2.6.  Chest x-ray showed multifocal pneumonia.  Covid test was negative.  Started on broad start antibiotics and admitted for further management.  Assessment & Plan:   Principal Problem:   Sepsis due to pneumonia [facility](HCC) Active Problems:   Rheumatoid arthritis with rheumatoid factor of multiple sites without organ or systems involvement (HCC)   Acute lower UTI   Acute on chronic respiratory failure with hypoxia (HCC)   COPD with acute exacerbation (HCC)   Acute metabolic encephalopathy   Transient hypotension   AKI (acute kidney injury) (HCC)   Sepsis present on admission: Was hypotensive, tachycardic on presentation.  Elevated lactic acid, leukocytosis.  Continue broad-spectrum antibiotics for now.  Lactic acidosis resolved.  Fluids stopped.  Follow-up cultures  Acute respiratory failure with hypoxia: Could be combination of multifocal pneumonia, COPD exacerbation.  Continue antibiotics, bronchodilators.  Currently on 4 L of oxygen by nasal cannula.  Not on oxygen at home.  Will check if she qualifies  for home oxygen.  She has a lot of rhonchi and wheezes today.  We will give 2 doses of Lasix  Multifocal pneumonia: Multifocal bilateral airspace process worse over the right lung likely multifocal pneumonia with suggestion of small right effusion.  Continue current  antibiotics.  She had wheezes and crackles this morning.   Discontinued IV fluids.  Will check sputum culture. MRSA screen negative.  Will consider discontinuing vancomycin soon. Aspiration pneumonia is also possibility.  Requested  speech therapy evaluation.  COPD exacerbation: Was wheezing this morning.  Continue steroids.  She was previously on oxygen at home but not using currently.She might qualify for home oxygen  Suspected UTI: Urine culture did not show significant growth.  Acute metabolic encephalopathy: Could be secondary to sepsis, acute respiratory failure.  Mental status much improved.  Currently she is alert and awake and oriented  AKI: Resolved   Normocytic anemia: Hemoglobin in the range of 7.  Will check iron studies.  Hypothyroidism: Continue Synthyroid  DM type 2:On metformin, Actos at home.  Continue sliding scale insulin for now.  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  Mostly bedbound due to her arthritis.  PT/OT evaluation done and recommended home health          DVT prophylaxis: Lovenox Code Status: Full Family Communication: Called daughter on 03/08/19 Disposition Plan: [place] home in next 24-48 hrs   Consultants: None  Procedures:None  Antimicrobials:  Anti-infectives (From admission, onward)   Start     Dose/Rate Route Frequency Ordered Stop   03/09/19 1300  vancomycin (VANCOCIN) IVPB 1000 mg/200 mL premix  Status:  Discontinued     1,000 mg 200 mL/hr over 60 Minutes Intravenous Every 48 hours 03/07/19 1236 03/08/19 1339   03/08/19 1400  vancomycin (VANCOCIN) IVPB 750 mg/150 ml premix     750 mg 150 mL/hr over 60 Minutes Intravenous Every 24 hours 03/08/19 1339     03/08/19 0000  ceFEPIme (MAXIPIME) 2 g in sodium  chloride 0.9 % 100 mL IVPB     2 g 200 mL/hr over 30 Minutes Intravenous Every 12 hours 03/07/19 1236     03/07/19 1230  vancomycin (VANCOCIN) 1,500 mg in sodium chloride 0.9 % 500 mL IVPB     1,500 mg 250 mL/hr over 120  Minutes Intravenous  Once 03/07/19 1217 03/07/19 1641   03/07/19 1200  vancomycin (VANCOCIN) IVPB 1000 mg/200 mL premix  Status:  Discontinued     1,000 mg 200 mL/hr over 60 Minutes Intravenous  Once 03/07/19 1152 03/07/19 1217   03/07/19 1200  ceFEPIme (MAXIPIME) 2 g in sodium chloride 0.9 % 100 mL IVPB     2 g 200 mL/hr over 30 Minutes Intravenous  Once 03/07/19 1152 03/07/19 1242      Subjective:  Patient seen and examined the bedside this morning.  Looks more comfortable today.  But he still having a lot of cough, wheezing.  Does not look in acute respiratory distress.  Denies any new complaints.   Objective: Vitals:   03/09/19 0000 03/09/19 0256 03/09/19 0802 03/09/19 0811  BP: 122/65  (!) 141/74   Pulse: 83  83 83  Resp: 16   20  Temp: 98.1 F (36.7 C)  97.8 F (36.6 C)   TempSrc: Oral  Oral   SpO2: 100% 93% 96%   Weight:      Height:        Intake/Output Summary (Last 24 hours) at 03/09/2019 1034 Last data filed at 03/09/2019 1017 Gross per 24 hour  Intake 278.91 ml  Output 1100 ml  Net -821.09 ml   Filed Weights   03/07/19 1200  Weight: 77.1 kg    Examination:  General exam: Deconditioned/debilitated, obese  HEENT:PERRL,Oral mucosa moist, Ear/Nose normal on gross exam Respiratory system: Bilateral decreased air entry, scattered wheezes, rhonchi Cardiovascular system: S1 & S2 heard, RRR. No JVD, murmurs, rubs, gallops or clicks. No pedal edema. Gastrointestinal system: Abdomen is nondistended, soft and nontender. No organomegaly or masses felt. Normal bowel sounds heard. Central nervous system: Alert and oriented. No focal neurological deficits. Extremities: No edema, no clubbing ,no cyanosis, distal peripheral pulses palpable. Skin: No rashes, lesions or ulcers,no icterus ,no pallor   Data Reviewed: I have personally reviewed following labs and imaging studies  CBC: Recent Labs  Lab 03/07/19 1123 03/08/19 0346 03/09/19 0708  WBC 17.8* 14.7* 13.2*   NEUTROABS 16.2*  --  11.9*  HGB 8.8* 8.0* 7.5*  HCT 28.7* 26.3* 24.8*  MCV 86.2 86.5 85.8  PLT 230 206 227   Basic Metabolic Panel: Recent Labs  Lab 03/07/19 1123 03/08/19 0346 03/09/19 0708  NA 135 136 136  K 4.0 4.8 4.4  CL 104 112* 111  CO2 21* 18* 19*  GLUCOSE 150* 152* 154*  BUN 21 19 12   CREATININE 1.43* 0.81 0.86  CALCIUM 8.2* 7.6* 8.1*   GFR: Estimated Creatinine Clearance: 55.2 mL/min (by C-G formula based on SCr of 0.86 mg/dL). Liver Function Tests: Recent Labs  Lab 03/07/19 1123  AST 23  ALT 11  ALKPHOS 57  BILITOT 0.4  PROT 5.3*  ALBUMIN 2.7*   Recent Labs  Lab 03/07/19 1203  LIPASE 71*   No results for input(s): AMMONIA in the last 168 hours. Coagulation Profile: Recent Labs  Lab 03/07/19 1203  INR 1.1   Cardiac Enzymes: No results for input(s): CKTOTAL, CKMB, CKMBINDEX, TROPONINI in the last 168 hours. BNP (last 3 results) No results for input(s): PROBNP in the last 8760 hours.  HbA1C: Recent Labs    03/08/19 0346  HGBA1C 5.9*   CBG: Recent Labs  Lab 03/08/19 0810 03/08/19 1140 03/08/19 1643 03/08/19 2105 03/09/19 0803  GLUCAP 157* 241* 255* 124* 140*   Lipid Profile: No results for input(s): CHOL, HDL, LDLCALC, TRIG, CHOLHDL, LDLDIRECT in the last 72 hours. Thyroid Function Tests: Recent Labs    03/07/19 2107  TSH 1.118   Anemia Panel: No results for input(s): VITAMINB12, FOLATE, FERRITIN, TIBC, IRON, RETICCTPCT in the last 72 hours. Sepsis Labs: Recent Labs  Lab 03/07/19 1123 03/07/19 1430 03/07/19 1444  PROCALCITON  --  25.11  --   LATICACIDVEN 2.6*  --  1.6    Recent Results (from the past 240 hour(s))  SARS Coronavirus 2 by RT PCR (hospital order, performed in [facility] hospital lab) Nasopharyngeal Nasopharyngeal Swab     Status: None   Collection Time: 03/07/19 12:12 PM   Specimen: Nasopharyngeal Swab  Result Value Ref Range Status   SARS Coronavirus 2 NEGATIVE NEGATIVE Final    Comment: (NOTE) If  result is NEGATIVE SARS-CoV-2 target nucleic acids are NOT DETECTED. The SARS-CoV-2 RNA is generally detectable in upper and lower  respiratory specimens during the acute phase of infection. The lowest  concentration of SARS-CoV-2 viral copies this assay can detect is 250  copies / mL. A negative result does not preclude SARS-CoV-2 infection  and should not be used as the sole basis for treatment or other  patient management decisions.  A negative result may occur with  improper specimen collection / handling, submission of specimen other  than nasopharyngeal swab, presence of viral mutation(s) within the  areas targeted by this assay, and inadequate number of viral copies  (<250 copies / mL). A negative result must be combined with clinical  observations, patient history, and epidemiological information. If result is POSITIVE SARS-CoV-2 target nucleic acids are DETECTED. The SARS-CoV-2 RNA is generally detectable in upper and lower  respiratory specimens dur ing the acute phase of infection.  Positive  results are indicative of active infection with SARS-CoV-2.  Clinical  correlation with patient history and other diagnostic information is  necessary to determine patient infection status.  Positive results do  not rule out bacterial infection or co-infection with other viruses. If result is PRESUMPTIVE POSTIVE SARS-CoV-2 nucleic acids MAY BE PRESENT.   A presumptive positive result was obtained on the submitted specimen  and confirmed on repeat testing.  While 2019 novel coronavirus  (SARS-CoV-2) nucleic acids may be present in the submitted sample  additional confirmatory testing may be necessary for epidemiological  and / or clinical management purposes  to differentiate between  SARS-CoV-2 and other Sarbecovirus currently known to infect humans.  If clinically indicated additional testing with an alternate test  methodology [id number]) is advised. The SARS-CoV-2 RNA is generally   detectable in upper and lower respiratory sp ecimens during the acute  phase of infection. The expected result is Negative. Fact Sheet for Patients:  BoilerBrush.com.cy Fact Sheet for Healthcare Providers: https://pope.com/ This test is not yet approved or cleared by the [place] FDA and has been authorized for detection and/or diagnosis of SARS-CoV-2 by FDA under an Emergency Use Authorization (EUA).  This EUA will remain in effect (meaning this test can be used) for the duration of the COVID-19 declaration under Section 564(b)(1) of the Act, 21 U.S.C. section 360bbb-3(b)(1), unless the authorization is terminated or revoked sooner. Performed at [facility], [address], [place], [place] [address]  Blood culture (routine x 2)     Status: None (Preliminary result)   Collection Time: 03/07/19 12:53 PM   Specimen: BLOOD RIGHT WRIST  Result Value Ref Range Status   Specimen Description BLOOD RIGHT WRIST  Final   Special Requests   Final    BOTTLES DRAWN AEROBIC AND ANAEROBIC Blood Culture adequate volume   Culture   Final    NO GROWTH < 24 HOURS Performed at [facility], [address], [place], [place] [address]    Report Status PENDING  Incomplete  Blood culture (routine x 2)     Status: None (Preliminary result)   Collection Time: 03/07/19 12:53 PM   Specimen: BLOOD LEFT WRIST  Result Value Ref Range Status   Specimen Description BLOOD LEFT WRIST  Final   Special Requests   Final    BOTTLES DRAWN AEROBIC AND ANAEROBIC Blood Culture results may not be optimal due to an inadequate volume of blood received in culture bottles   Culture   Final    NO GROWTH < 24 HOURS Performed at [facility], [address] [place], [place] [address]    Report Status PENDING  Incomplete  Urine culture     Status: Abnormal   Collection Time: 03/07/19  7:02 PM   Specimen: Urine, Random  Result Value Ref Range Status    Specimen Description URINE, RANDOM  Final   Special Requests NONE  Final   Culture (A)  Final    <10,000 COLONIES/mL INSIGNIFICANT GROWTH Performed at [facility], [address], [place], [place] [address]    Report Status 03/08/2019 FINAL  Final  MRSA PCR Screening     Status: None   Collection Time: 03/07/19  8:34 PM   Specimen: Nasal Mucosa; Nasopharyngeal  Result Value Ref Range Status   MRSA by PCR NEGATIVE NEGATIVE Final    Comment:        The GeneXpert MRSA Assay (FDA approved for NASAL specimens only), is one component of a comprehensive MRSA colonization surveillance program. It is not intended to diagnose MRSA infection nor to guide or monitor treatment for MRSA infections. Performed at [facility], [address], [place], [place] [address]          Radiology Studies: Dg Chest Portable 1 View  Result Date: 03/07/2019 CLINICAL DATA:  Shortness of breath and altered mental status. EXAM: PORTABLE CHEST 1 VIEW COMPARISON:  10/18/2018 FINDINGS: Lungs are adequately inflated demonstrate new multifocal airspace process over the right lung and left base/retrocardiac region likely multifocal pneumonia. Suggestion of a small amount right pleural fluid. Cardiomediastinal silhouette and remainder of the exam is unchanged. IMPRESSION: Multifocal bilateral airspace process worse over the right lung likely multifocal pneumonia with suggestion of small right effusion. Electronically Signed   By: Elberta Fortis M.D.   On: 03/07/2019 11:47        Scheduled Meds: . enoxaparin (LOVENOX) injection  40 mg Subcutaneous Q24H  . fluticasone furoate-vilanterol  1 puff Inhalation Daily  . furosemide  40 mg Intravenous Q12H  . gabapentin  1,200 mg Oral BID  . guaiFENesin-dextromethorphan  10 mL Oral Q6H  . insulin aspart  0-15 Units Subcutaneous TID WC  . insulin aspart  0-5 Units Subcutaneous QHS  . ipratropium-albuterol  3 mL Nebulization Q6H  . levothyroxine  75 mcg  Oral Q0600  . methylPREDNISolone (SOLU-MEDROL) injection  60 mg Intravenous Q12H  . pantoprazole  40 mg Oral Daily  . simvastatin  40 mg Oral q1800  . sodium chloride flush  3 mL Intravenous Once  . sodium chloride flush  3 mL Intravenous Q12H  . venlafaxine XR  150 mg Oral q morning - 10a  . ziprasidone  40 mg Oral QHS   Continuous Infusions: . sodium chloride 1,000 mL (03/08/19 1305)  . ceFEPime (MAXIPIME) IV 2 g (03/08/19 2248)  . vancomycin 750 mg (03/08/19 1626)     LOS: 2 days    Time spent: 25 mins.More than 50% of that time was spent in counseling and/or coordination of care.      Shelly Coss, MD [facility] Pager [phone]  If 7PM-7AM, please contact night-coverage [email or web address] Password [facility] 03/09/2019, 10:34 AM

## 2019-03-09 NOTE — Progress Notes (Signed)
Modified Barium Swallow Progress Note  Patient Details  Name: Karla Clark MRN: [id number] Date of Birth: 09-08-1945  Today's Date: 03/09/2019  Modified Barium Swallow completed.  Full report located under Chart Review in the Imaging Section.  Brief recommendations include the following:  Clinical Impression   Pt presents with a mild-moderate oropharyngeal dysphagia, most likely related to deconditioning and subsequent respiratory decompensation.  Pt's swallow response was triggered at the level of the pyriform sinuses and pt appeared to have decreased laryngeal closure during the swallow which lead to deep penetration and/or silent aspiration of small amounts of thin liquids via cup sips and straw.  Strategies were attempted to maximize airway protection; however pt continued with at least deep penetration of thin liquids.  Pt did have a strong volitional cough which cleared small amounts of penetrates from the cords, but needed mod cues to successfully employ strategies so I have concerns about her ability to carryover recommendations at bedside.  Nectar thick liquids mitigated aspiration and deep penetration (trace, high penetration was observed but this is not uncommon with pt's age and comorbidities).  For now, I would recommend downgrading pt to nectar thick liquids to maximize safety with PO intake, although I feel her potential to advance quickly is good.    Swallow Evaluation Recommendations       SLP Diet Recommendations: Dysphagia 3 (Mech soft) solids;Nectar thick liquid   Liquid Administration via: Cup   Medication Administration: Whole meds with puree   Supervision: Full supervision/cueing for compensatory strategies   Compensations: Minimize environmental distractions;Slow rate;Small sips/bites   Postural Changes: Remain semi-upright after after feeds/meals (Comment);Seated upright at 90 degrees   Oral Care Recommendations: Oral care BID   Other Recommendations:  Order thickener from pharmacy    Joseantonio Dittmar, Elmyra Ricks L 03/09/2019,3:58 PM

## 2019-03-09 NOTE — Plan of Care (Signed)
  Problem: Activity: Goal: Risk for activity intolerance will decrease Outcome: Progressing   Problem: Nutrition: Goal: Adequate nutrition will be maintained Outcome: Progressing   

## 2019-03-10 ENCOUNTER — Inpatient Hospital Stay (HOSPITAL_COMMUNITY): Payer: Medicare Other

## 2019-03-10 DIAGNOSIS — R0602 Shortness of breath: Secondary | ICD-10-CM

## 2019-03-10 LAB — BASIC METABOLIC PANEL
Anion gap: 11 (ref 5–15)
BUN: 16 mg/dL (ref 8–23)
CO2: 23 mmol/L (ref 22–32)
Calcium: 8.4 mg/dL — ABNORMAL LOW (ref 8.9–10.3)
Chloride: 105 mmol/L (ref 98–111)
Creatinine, Ser: 0.86 mg/dL (ref 0.44–1.00)
GFR calc Af Amer: >60 mL/min (ref >60)
GFR calc non Af Amer: >60 mL/min (ref >60)
Glucose, Bld: 215 mg/dL — ABNORMAL HIGH (ref 70–99)
Potassium: 4 mmol/L (ref 3.5–5.1)
Sodium: 139 mmol/L (ref 135–145)

## 2019-03-10 LAB — CBC WITH DIFFERENTIAL/PLATELET
Abs Immature Granulocytes: 0.23 10*3/uL — ABNORMAL HIGH (ref 0.00–0.07)
Basophils Absolute: 0 10*3/uL (ref 0.0–0.1)
Basophils Relative: 0 %
Eosinophils Absolute: 0 10*3/uL (ref 0.0–0.5)
Eosinophils Relative: 0 %
HCT: 25 % — ABNORMAL LOW (ref 36.0–46.0)
Hemoglobin: 7.7 g/dL — ABNORMAL LOW (ref 12.0–15.0)
Immature Granulocytes: 2 %
Lymphocytes Relative: 3 %
Lymphs Abs: 0.3 10*3/uL — ABNORMAL LOW (ref 0.7–4.0)
MCH: 26.2 pg (ref 26.0–34.0)
MCHC: 30.8 g/dL (ref 30.0–36.0)
MCV: 85 fL (ref 80.0–100.0)
Monocytes Absolute: 0.4 10*3/uL (ref 0.1–1.0)
Monocytes Relative: 4 %
Neutro Abs: 9 10*3/uL — ABNORMAL HIGH (ref 1.7–7.7)
Neutrophils Relative %: 91 %
Platelets: 269 10*3/uL (ref 150–400)
RBC: 2.94 MIL/uL — ABNORMAL LOW (ref 3.87–5.11)
RDW: 19.1 % — ABNORMAL HIGH (ref 11.5–15.5)
WBC: 10 10*3/uL (ref 4.0–10.5)
nRBC: 0 % (ref 0.0–0.2)

## 2019-03-10 LAB — ECHOCARDIOGRAM COMPLETE
Height: 62 in
Weight: 2720 oz

## 2019-03-10 LAB — GLUCOSE, CAPILLARY
Glucose-Capillary: 193 mg/dL — ABNORMAL HIGH (ref 70–99)
Glucose-Capillary: 225 mg/dL — ABNORMAL HIGH (ref 70–99)
Glucose-Capillary: 123 mg/dL — ABNORMAL HIGH (ref 70–99)
Glucose-Capillary: 289 mg/dL — ABNORMAL HIGH (ref 70–99)

## 2019-03-10 MED ORDER — FUROSEMIDE 10 MG/ML IJ SOLN
40.0000 mg | Freq: Two times a day (BID) | INTRAMUSCULAR | Status: AC
  Administered 2019-03-10 (×2): 40 mg via INTRAVENOUS
  Filled 2019-03-10: qty 4

## 2019-03-10 MED ORDER — BUDESONIDE 0.25 MG/2ML IN SUSP
0.2500 mg | Freq: Two times a day (BID) | RESPIRATORY_TRACT | Status: DC
  Administered 2019-03-10 – 2019-03-12 (×4): 0.25 mg via RESPIRATORY_TRACT
  Filled 2019-03-10 (×4): qty 2

## 2019-03-10 MED ORDER — SODIUM CHLORIDE 0.9 % IV SOLN
510.0000 mg | Freq: Once | INTRAVENOUS | Status: AC
  Administered 2019-03-10: 510 mg via INTRAVENOUS
  Filled 2019-03-10: qty 17

## 2019-03-10 NOTE — Progress Notes (Signed)
Patient out of bed to chair for evening meal. Will have her out of bed for all meals tomorrow and encourage safe swallowing guidelines. Memory is poor. Patient has asked for thin liquids multiple times throughout the day and has had frequent reminders of her aspiration risk.

## 2019-03-10 NOTE — Plan of Care (Signed)
  Problem: Clinical Measurements: Goal: Respiratory complications will improve Outcome: Progressing   

## 2019-03-10 NOTE — Progress Notes (Signed)
PROGRESS NOTE    Karla Clark  ZOX:[id number] DOB: [birth year] DOA: 03/07/2019 PCP: Charlott RakesHodges, [place], MD   Brief Narrative:  Patient is a [age] female with history of hypertension, hyperlipidemia, COPD, diabetes type 2, pneumonia, remote arthritis, hypothyroidism, anxiety, schizophrenia who was brought to the emergency department due to altered mental status.  At baseline, patient is not very mobile due to her rheumatoid arthritis.  On presentation she was found to be altered, hypotensive, hypoxic.  She had leukocytosis, acute kidney injury and lactic acid of 2.6.  Chest x-ray showed multifocal pneumonia.  Covid test was negative.  Started on broad start antibiotics and admitted for further management.  Assessment & Plan:   Principal Problem:   Sepsis due to pneumonia [facility](HCC) Active Problems:   Rheumatoid arthritis with rheumatoid factor of multiple sites without organ or systems involvement (HCC)   Acute lower UTI   Acute on chronic respiratory failure with hypoxia (HCC)   COPD with acute exacerbation (HCC)   Acute metabolic encephalopathy   Transient hypotension   AKI (acute kidney injury) (HCC)   Sepsis present on admission: Was hypotensive, tachycardic on presentation.  Elevated lactic acid, leukocytosis.  Continue broad-spectrum antibiotics for now.  Lactic acidosis resolved.  Fluids stopped. Negative  cultures  Acute respiratory failure with hypoxia: Could be combination of multifocal pneumonia, COPD exacerbation,volume overload.  Continue antibiotics, bronchodilators.  Currently on 4 L of oxygen by nasal cannula.  Not on oxygen at home.  Will check if she qualifies  for home oxygen.  She has a lot of rhonchi and wheezes today.  We will give 2 doses of Lasix again.  Multifocal pneumonia: Multifocal bilateral airspace process worse over the right lung likely multifocal pneumonia with suggestion of small right effusion.  Continue current  antibiotics.  She had wheezes and crackles  this morning.  Discontinued IV fluids.  Will check sputum culture. MRSA screen negative.  Will consider discontinuing vancomycin soon. Aspiration pneumonia is also possibility.  Requested  speech therapy evaluation.  COPD exacerbation: Was wheezing this morning.  Continue steroids.  She was previously on oxygen at home but not using currently.She might qualify for home oxygen  Suspected UTI: Urine culture did not show significant growth.  Acute metabolic encephalopathy: Could be secondary to sepsis, acute respiratory failure.  Mental status much improved.  Currently she is alert and awake and oriented  AKI: Resolved   Normocytic anemia: Hemoglobin in the range of 7.  Iron deficiency. Given  IV iron.  Hypothyroidism: Continue Synthyroid  DM type 2:On metformin, Actos at home.  Continue sliding scale insulin for now.  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  Mostly bedbound due to her arthritis.  PT/OT evaluation done and recommended home health          DVT prophylaxis: Lovenox Code Status: Full Family Communication: Called daughter on 03/08/19 Disposition Plan: [place] home in next 24-48 hrs   Consultants: None  Procedures:None  Antimicrobials:  Anti-infectives (From admission, onward)   Start     Dose/Rate Route Frequency Ordered Stop   03/09/19 1300  vancomycin (VANCOCIN) IVPB 1000 mg/200 mL premix  Status:  Discontinued     1,000 mg 200 mL/hr over 60 Minutes Intravenous Every 48 hours 03/07/19 1236 03/08/19 1339   03/08/19 1400  vancomycin (VANCOCIN) IVPB 750 mg/150 ml premix  Status:  Discontinued     750 mg 150 mL/hr over 60 Minutes Intravenous Every 24 hours 03/08/19 1339 03/09/19 1446   03/08/19  0000  ceFEPIme (MAXIPIME) 2 g in sodium chloride 0.9 % 100 mL IVPB     2 g 200 mL/hr over 30 Minutes Intravenous Every 12 hours 03/07/19 1236     03/07/19 1230  vancomycin (VANCOCIN) 1,500 mg in sodium  chloride 0.9 % 500 mL IVPB     1,500 mg 250 mL/hr over 120 Minutes Intravenous  Once 03/07/19 1217 03/07/19 1641   03/07/19 1200  vancomycin (VANCOCIN) IVPB 1000 mg/200 mL premix  Status:  Discontinued     1,000 mg 200 mL/hr over 60 Minutes Intravenous  Once 03/07/19 1152 03/07/19 1217   03/07/19 1200  ceFEPIme (MAXIPIME) 2 g in sodium chloride 0.9 % 100 mL IVPB     2 g 200 mL/hr over 30 Minutes Intravenous  Once 03/07/19 1152 03/07/19 1242      Subjective:  Patient seen and examined the bedside this morning.  Hemodynamically stable.  She is still wheezing.  But she  looks better today.   Objective: Vitals:   03/10/19 0236 03/10/19 0400 03/10/19 0743 03/10/19 0809  BP:  (!) 146/68 (!) 141/65   Pulse:   74 68  Resp:   20 18  Temp:  97.7 F (36.5 C) 98.4 F (36.9 C)   TempSrc:  Axillary Oral   SpO2: 93% 95% 99% 99%  Weight:      Height:        Intake/Output Summary (Last 24 hours) at 03/10/2019 1016 Last data filed at 03/10/2019 0747 Gross per 24 hour  Intake 850 ml  Output 2700 ml  Net -1850 ml   Filed Weights   03/07/19 1200  Weight: 77.1 kg    Examination:  General exam: Deconditioned/debilitated, obese  HEENT:PERRL,Oral mucosa moist, Ear/Nose normal on gross exam Respiratory system: Bilateral decreased air entry, scattered wheezes, rhonchi Cardiovascular system: S1 & S2 heard, RRR. No JVD, murmurs, rubs, gallops or clicks. No pedal edema. Gastrointestinal system: Abdomen is nondistended, soft and nontender. No organomegaly or masses felt. Normal bowel sounds heard. Central nervous system: Alert and oriented. No focal neurological deficits. Extremities: No edema, no clubbing ,no cyanosis, distal peripheral pulses palpable. Skin: No rashes, lesions or ulcers,no icterus ,no pallor   Data Reviewed: I have personally reviewed following labs and imaging studies  CBC: Recent Labs  Lab 03/07/19 1123 03/08/19 0346 03/09/19 0708 03/10/19 0333  WBC 17.8* 14.7*  13.2* 10.0  NEUTROABS 16.2*  --  11.9* 9.0*  HGB 8.8* 8.0* 7.5* 7.7*  HCT 28.7* 26.3* 24.8* 25.0*  MCV 86.2 86.5 85.8 85.0  PLT 230 206 227 109   Basic Metabolic Panel: Recent Labs  Lab 03/07/19 1123 03/08/19 0346 03/09/19 0708 03/10/19 0333  NA 135 136 136 139  K 4.0 4.8 4.4 4.0  CL 104 112* 111 105  CO2 21* 18* 19* 23  GLUCOSE 150* 152* 154* 215*  BUN 21 19 12 16   CREATININE 1.43* 0.81 0.86 0.86  CALCIUM 8.2* 7.6* 8.1* 8.4*   GFR: Estimated Creatinine Clearance: 55.2 mL/min (by C-G formula based on SCr of 0.86 mg/dL). Liver Function Tests: Recent Labs  Lab 03/07/19 1123  AST 23  ALT 11  ALKPHOS 57  BILITOT 0.4  PROT 5.3*  ALBUMIN 2.7*   Recent Labs  Lab 03/07/19 1203  LIPASE 71*   No results for input(s): AMMONIA in the last 168 hours. Coagulation Profile: Recent Labs  Lab 03/07/19 1203  INR 1.1   Cardiac Enzymes: No results for input(s): CKTOTAL, CKMB, CKMBINDEX, TROPONINI in the last 168 hours.  BNP (last 3 results) No results for input(s): PROBNP in the last 8760 hours. HbA1C: Recent Labs    03/08/19 0346  HGBA1C 5.9*   CBG: Recent Labs  Lab 03/09/19 0803 03/09/19 1135 03/09/19 1655 03/09/19 2137 03/10/19 0744  GLUCAP 140* 277* 278* 243* 193*   Lipid Profile: No results for input(s): CHOL, HDL, LDLCALC, TRIG, CHOLHDL, LDLDIRECT in the last 72 hours. Thyroid Function Tests: Recent Labs    03/07/19 2107  TSH 1.118   Anemia Panel: Recent Labs    03/09/19 1112  FERRITIN 62  TIBC 329  IRON 20*   Sepsis Labs: Recent Labs  Lab 03/07/19 1123 03/07/19 1430 03/07/19 1444  PROCALCITON  --  25.11  --   LATICACIDVEN 2.6*  --  1.6    Recent Results (from the past 240 hour(s))  SARS Coronavirus 2 by RT PCR (hospital order, performed in [facility] hospital lab) Nasopharyngeal Nasopharyngeal Swab     Status: None   Collection Time: 03/07/19 12:12 PM   Specimen: Nasopharyngeal Swab  Result Value Ref Range Status   SARS Coronavirus  2 NEGATIVE NEGATIVE Final    Comment: (NOTE) If result is NEGATIVE SARS-CoV-2 target nucleic acids are NOT DETECTED. The SARS-CoV-2 RNA is generally detectable in upper and lower  respiratory specimens during the acute phase of infection. The lowest  concentration of SARS-CoV-2 viral copies this assay can detect is 250  copies / mL. A negative result does not preclude SARS-CoV-2 infection  and should not be used as the sole basis for treatment or other  patient management decisions.  A negative result may occur with  improper specimen collection / handling, submission of specimen other  than nasopharyngeal swab, presence of viral mutation(s) within the  areas targeted by this assay, and inadequate number of viral copies  (<250 copies / mL). A negative result must be combined with clinical  observations, patient history, and epidemiological information. If result is POSITIVE SARS-CoV-2 target nucleic acids are DETECTED. The SARS-CoV-2 RNA is generally detectable in upper and lower  respiratory specimens dur ing the acute phase of infection.  Positive  results are indicative of active infection with SARS-CoV-2.  Clinical  correlation with patient history and other diagnostic information is  necessary to determine patient infection status.  Positive results do  not rule out bacterial infection or co-infection with other viruses. If result is PRESUMPTIVE POSTIVE SARS-CoV-2 nucleic acids MAY BE PRESENT.   A presumptive positive result was obtained on the submitted specimen  and confirmed on repeat testing.  While 2019 novel coronavirus  (SARS-CoV-2) nucleic acids may be present in the submitted sample  additional confirmatory testing may be necessary for epidemiological  and / or clinical management purposes  to differentiate between  SARS-CoV-2 and other Sarbecovirus currently known to infect humans.  If clinically indicated additional testing with an alternate test  methodology  [phone]([id number]) is advised. The SARS-CoV-2 RNA is generally  detectable in upper and lower respiratory sp ecimens during the acute  phase of infection. The expected result is Negative. Fact Sheet for Patients:  BoilerBrush.com.cyhttps://www.fda.gov/media/136312/download Fact Sheet for Healthcare Providers: https://pope.com/https://www.fda.gov/media/136313/download This test is not yet approved or cleared by the [place] FDA and has been authorized for detection and/or diagnosis of SARS-CoV-2 by FDA under an Emergency Use Authorization (EUA).  This EUA will remain in effect (meaning this test can be used) for the duration of the COVID-19 declaration under Section 564(b)(1) of the Act, 21 U.S.C. section 360bbb-3(b)(1), unless the authorization is terminated  or revoked sooner. Performed at [facility], [address], [place], [place] [address]   Blood culture (routine x 2)     Status: None (Preliminary result)   Collection Time: 03/07/19 12:53 PM   Specimen: BLOOD RIGHT WRIST  Result Value Ref Range Status   Specimen Description BLOOD RIGHT WRIST  Final   Special Requests   Final    BOTTLES DRAWN AEROBIC AND ANAEROBIC Blood Culture adequate volume   Culture   Final    NO GROWTH 3 DAYS Performed at [facility], [address] [place], [place] [address]    Report Status PENDING  Incomplete  Blood culture (routine x 2)     Status: None (Preliminary result)   Collection Time: 03/07/19 12:53 PM   Specimen: BLOOD LEFT WRIST  Result Value Ref Range Status   Specimen Description BLOOD LEFT WRIST  Final   Special Requests   Final    BOTTLES DRAWN AEROBIC AND ANAEROBIC Blood Culture results may not be optimal due to an inadequate volume of blood received in culture bottles   Culture   Final    NO GROWTH 3 DAYS Performed at [facility], [address], [place], [place] [address]    Report Status PENDING  Incomplete  Urine culture     Status: Abnormal   Collection Time: 03/07/19  7:02 PM   Specimen:  Urine, Random  Result Value Ref Range Status   Specimen Description URINE, RANDOM  Final   Special Requests NONE  Final   Culture (A)  Final    <10,000 COLONIES/mL INSIGNIFICANT GROWTH Performed at [facility], [address] [place], [place] [address]    Report Status 03/08/2019 FINAL  Final  MRSA PCR Screening     Status: None   Collection Time: 03/07/19  8:34 PM   Specimen: Nasal Mucosa; Nasopharyngeal  Result Value Ref Range Status   MRSA by PCR NEGATIVE NEGATIVE Final    Comment:        The GeneXpert MRSA Assay (FDA approved for NASAL specimens only), is one component of a comprehensive MRSA colonization surveillance program. It is not intended to diagnose MRSA infection nor to guide or monitor treatment for MRSA infections. Performed at [facility], [address] [place], [place] [address]          Radiology Studies: Dg Swallowing Func-speech Pathology  Result Date: 03/09/2019 Objective Swallowing Evaluation: Type of Study: MBS-Modified Barium Swallow Study  Patient Details Name: DAMARIS ABELN MRN: [id number] Date of Birth: 05-31-1945 Today's Date: 03/09/2019 Time: SLP Start Time (ACUTE ONLY): 1500 -SLP Stop Time (ACUTE ONLY): 1515 SLP Time Calculation (min) (ACUTE ONLY): 15 min Past Medical History: Past Medical History: Diagnosis Date  Anxiety   COPD (chronic obstructive pulmonary disease) (HCC)   Diabetes mellitus without complication (HCC)   Elevated cholesterol   Hypertension   Hypothyroidism   Osteoarthritis   RA (rheumatoid arthritis) (HCC)   Schizophrenia (HCC)   delusional Past Surgical History: Past Surgical History: Procedure Laterality Date  ABDOMINAL HYSTERECTOMY   HPI: Patient is a 18 female with history of hypertension, hyperlipidemia, COPD, diabetes type 2, pneumonia, remote arthritis, hypothyroidism, anxiety, schizophrenia who was brought to the emergency department due to altered mental status.  At baseline, patient is not very  mobile due to her rheumatoid arthritis.  On presentation she was found to be altered, hypotensive, hypoxic.  She had leukocytosis, acute kidney injury and lactic acid of 2.6.  Chest x-ray showed multifocal pneumonia.  Covid test was negative.  Started on broad start antibiotics and admitted for further management. Multifocal bilateral airspace process worse over the right lung likely multifocal pneumonia with suggestion of small right effusion.   No data recorded Assessment / Plan / Recommendation CHL IP CLINICAL IMPRESSIONS 03/09/2019 Clinical Impression Pt presents with a mild-moderate oropharyngeal dysphagia, most likely related to deconditioning and subsequent respiratory decompensation.  Pt's swallow response was triggered at the level of the pyriform sinuses and pt appeared to have decreased laryngeal closure during the swallow which lead to deep penetration and/or silent aspiration of small amounts of thin liquids via cup sips and straw.  Strategies were attempted to maximize airway protection; however pt continued with at least deep penetration of thin liquids.  Pt did have a strong volitional cough which cleared small amounts of penetrates from the cords, but needed mod cues to successfully employ strategies so I have concerns about her ability to carryover recommendations at bedside.  Nectar thick liquids mitigated aspiration and deep penetration (trace, high penetration was observed but this is not uncommon with pt's age and comorbidities).  For now, I would recommend downgrading pt to nectar thick liquids to maximize safety with PO intake, although I feel her potential to advance quickly is good.   SLP Visit Diagnosis Dysphagia, oropharyngeal phase (R13.12) Attention and concentration deficit following -- Frontal lobe and executive function deficit following -- Impact on safety and function Moderate aspiration risk   CHL IP TREATMENT RECOMMENDATION 03/09/2019 Treatment Recommendations Therapy as outlined  in treatment plan below   Prognosis 03/09/2019 Prognosis for Safe Diet Advancement Good Barriers to Reach Goals -- Barriers/Prognosis Comment -- CHL IP DIET RECOMMENDATION 03/09/2019 SLP Diet Recommendations Dysphagia 3 (Mech soft) solids;Nectar thick liquid Liquid Administration via Cup Medication Administration Whole meds with puree Compensations Minimize environmental distractions;Slow rate;Small sips/bites Postural Changes Remain semi-upright after after feeds/meals (Comment);Seated upright at 90 degrees   CHL IP OTHER RECOMMENDATIONS 03/09/2019 Recommended Consults -- Oral Care Recommendations Oral care BID Other Recommendations Order thickener from pharmacy   CHL IP FOLLOW UP RECOMMENDATIONS 03/09/2019 Follow up Recommendations 24 hour supervision/assistance   CHL IP FREQUENCY AND DURATION 03/09/2019 Speech Therapy Frequency (ACUTE ONLY) min 2x/week Treatment Duration 2 weeks      CHL IP ORAL PHASE 03/09/2019 Oral Phase WFL Oral - Pudding Teaspoon -- Oral - Pudding Cup -- Oral - Honey Teaspoon -- Oral - Honey Cup -- Oral - Nectar Teaspoon -- Oral - Nectar Cup -- Oral - Nectar Straw -- Oral - Thin Teaspoon -- Oral - Thin Cup -- Oral - Thin Straw -- Oral - Puree -- Oral - Mech Soft -- Oral - Regular -- Oral - Multi-Consistency -- Oral - Pill -- Oral Phase - Comment --  CHL IP PHARYNGEAL PHASE 03/09/2019 Pharyngeal Phase Impaired Pharyngeal- Pudding Teaspoon -- Pharyngeal -- Pharyngeal- Pudding Cup -- Pharyngeal -- Pharyngeal- Honey Teaspoon -- Pharyngeal -- Pharyngeal- Honey Cup -- Pharyngeal -- Pharyngeal- Nectar Teaspoon -- Pharyngeal -- Pharyngeal- Nectar Cup Reduced airway/laryngeal closure;Penetration/Aspiration during swallow Pharyngeal Material enters airway, remains ABOVE vocal cords and not ejected out Pharyngeal- Nectar Straw -- Pharyngeal -- Pharyngeal- Thin Teaspoon -- Pharyngeal -- Pharyngeal- Thin Cup Delayed swallow initiation-pyriform sinuses;Penetration/Aspiration during swallow;Reduced  airway/laryngeal closure Pharyngeal Material enters airway, passes BELOW cords without attempt by patient to eject out (silent aspiration);Material enters airway, CONTACTS cords and not ejected out Pharyngeal- Thin Straw Reduced airway/laryngeal closure;Penetration/Aspiration during swallow;Delayed swallow initiation-pyriform sinuses Pharyngeal Material enters airway, CONTACTS cords  and not ejected out Pharyngeal- Puree -- Pharyngeal -- Pharyngeal- Mechanical Soft -- Pharyngeal -- Pharyngeal- Regular -- Pharyngeal -- Pharyngeal- Multi-consistency -- Pharyngeal -- Pharyngeal- Pill -- Pharyngeal -- Pharyngeal Comment --  No flowsheet data found. Page, Melanee Spry 03/09/2019, 4:07 PM                   Scheduled Meds:  budesonide (PULMICORT) nebulizer solution  0.25 mg Nebulization BID   enoxaparin (LOVENOX) injection  40 mg Subcutaneous Q24H   furosemide  40 mg Intravenous Q12H   gabapentin  1,200 mg Oral BID   guaiFENesin-dextromethorphan  10 mL Oral Q6H   insulin aspart  0-15 Units Subcutaneous TID WC   insulin aspart  0-5 Units Subcutaneous QHS   ipratropium-albuterol  3 mL Nebulization Q6H   levothyroxine  75 mcg Oral Q0600   methylPREDNISolone (SOLU-MEDROL) injection  60 mg Intravenous Q12H   pantoprazole  40 mg Oral Daily   simvastatin  40 mg Oral q1800   sodium chloride flush  3 mL Intravenous Once   sodium chloride flush  3 mL Intravenous Q12H   venlafaxine XR  150 mg Oral q morning - 10a   ziprasidone  40 mg Oral QHS   Continuous Infusions:  sodium chloride 1,000 mL (03/08/19 1305)   ceFEPime (MAXIPIME) IV 2 g (03/10/19 0005)   ferumoxytol       LOS: 3 days    Time spent: 25 mins.More than 50% of that time was spent in counseling and/or coordination of care.      Burnadette Pop, MD [facility] Pager [phone]  If 7PM-7AM, please contact night-coverage [email or web address] Password [email or web address] 03/10/2019, 10:16 AM

## 2019-03-10 NOTE — Progress Notes (Signed)
Pharmacy Antibiotic Note  Karla Clark is a 73 y.o. female admitted on 03/07/2019 with pneumonia.  Pharmacy has been consulted for Cefepime dosing.   ID: Abx D#4 for PNA and UTI. Afeb, WBC 10 down (on steroids), SCr 1.43>>0.86 (CrCl 55), LA 1.6, PCT elevated (25)  Vanc 11/4>> Cefepime 11/4>>  11/4 MRSA pcr neg 11/4 BCx >> ngtd 11/4 UCx >> <10k insignificant 11/4 COVID neg  Plan: - Cont Cefepime 2g/12h - D/c Vanc with MRSA PCR neg - PTA Gabapentin dose exceeds recommended max 1400mg /d for CrCl 30-60.  -Pharmacy will sign off. Please reconsult for further dosing assitance.    Height: 5\' 2"  (157.5 cm) Weight: 170 lb (77.1 kg)(per last visit) IBW/kg (Calculated) : 50.1  Temp (24hrs), Avg:98.1 F (36.7 C), Min:97.7 F (36.5 C), Max:98.6 F (37 C)  Recent Labs  Lab 03/07/19 1123 03/07/19 1444 03/08/19 0346 03/09/19 0708 03/10/19 0333  WBC 17.8*  --  14.7* 13.2* 10.0  CREATININE 1.43*  --  0.81 0.86 0.86  LATICACIDVEN 2.6* 1.6  --   --   --     Estimated Creatinine Clearance: 55.2 mL/min (by C-G formula based on SCr of 0.86 mg/dL).    Allergies  Allergen Reactions  . Ace Inhibitors Cough     Thank you for allowing pharmacy to be a part of this patient's care.  Khris Jansson S. Alford Highland, PharmD, BCPS Clinical Staff Pharmacist Eilene Ghazi [facility] 03/10/2019 7:16 AM

## 2019-03-10 NOTE — Progress Notes (Signed)
SATURATION QUALIFICATIONS: (This note is used to comply with regulatory documentation for home oxygen)  Patient Saturations on Room Air at Rest = 93%  Patient Saturations on Room Air while Ambulating = 89%  Patient Saturations on 2 Liters of oxygen while Ambulating =  95%  Patient does not qualify for home O2. Oxygen discontinued.

## 2019-03-10 NOTE — Progress Notes (Signed)
  Echocardiogram 2D Echocardiogram has been performed.  Karla Clark 03/10/2019, 11:50 AM

## 2019-03-11 ENCOUNTER — Inpatient Hospital Stay (HOSPITAL_COMMUNITY): Payer: Medicare Other

## 2019-03-11 LAB — GLUCOSE, CAPILLARY
Glucose-Capillary: 175 mg/dL — ABNORMAL HIGH (ref 70–99)
Glucose-Capillary: 288 mg/dL — ABNORMAL HIGH (ref 70–99)
Glucose-Capillary: 189 mg/dL — ABNORMAL HIGH (ref 70–99)
Glucose-Capillary: 144 mg/dL — ABNORMAL HIGH (ref 70–99)

## 2019-03-11 LAB — BASIC METABOLIC PANEL
Anion gap: 11 (ref 5–15)
BUN: 18 mg/dL (ref 8–23)
CO2: 28 mmol/L (ref 22–32)
Calcium: 8.6 mg/dL — ABNORMAL LOW (ref 8.9–10.3)
Chloride: 99 mmol/L (ref 98–111)
Creatinine, Ser: 0.95 mg/dL (ref 0.44–1.00)
GFR calc Af Amer: >60 mL/min (ref >60)
GFR calc non Af Amer: 59 mL/min — ABNORMAL LOW (ref >60)
Glucose, Bld: 210 mg/dL — ABNORMAL HIGH (ref 70–99)
Potassium: 3.5 mmol/L (ref 3.5–5.1)
Sodium: 138 mmol/L (ref 135–145)

## 2019-03-11 MED ORDER — FUROSEMIDE 10 MG/ML IJ SOLN
40.0000 mg | Freq: Once | INTRAMUSCULAR | Status: AC
  Administered 2019-03-11: 40 mg via INTRAVENOUS
  Filled 2019-03-11: qty 4

## 2019-03-11 MED ORDER — IPRATROPIUM-ALBUTEROL 0.5-2.5 (3) MG/3ML IN SOLN
3.0000 mL | Freq: Three times a day (TID) | RESPIRATORY_TRACT | Status: DC
  Administered 2019-03-11 – 2019-03-12 (×3): 3 mL via RESPIRATORY_TRACT
  Filled 2019-03-11 (×3): qty 3

## 2019-03-11 NOTE — Progress Notes (Addendum)
PROGRESS NOTE    Karla Clark  YNW:[id number] DOB: [birth year] DOA: 03/07/2019 PCP: Charlott RakesHodges, [place], MD   Brief Narrative:  Patient is a 89 female with history of hypertension, hyperlipidemia, COPD, diabetes type 2, pneumonia, remote arthritis, hypothyroidism, anxiety, schizophrenia who was brought to the emergency department due to altered mental status.  At baseline, patient is not very mobile due to her rheumatoid arthritis.  On presentation she was found to be altered, hypotensive, hypoxic.  She had leukocytosis, acute kidney injury and lactic acid of 2.6.  Chest x-ray showed multifocal pneumonia.  Covid test was negative.  Started on broad start antibiotics and admitted for further management. Respiratory status slowly improving but she still has persistent wheezing. PT/OT evaluated her and recommended home health. Planning for discharge tomorrow.  Assessment & Plan:   Principal Problem:   Sepsis due to pneumonia [facility](HCC) Active Problems:   Rheumatoid arthritis with rheumatoid factor of multiple sites without organ or systems involvement (HCC)   Acute lower UTI   Acute on chronic respiratory failure with hypoxia (HCC)   COPD with acute exacerbation (HCC)   Acute metabolic encephalopathy   Transient hypotension   AKI (acute kidney injury) (HCC)   Sepsis present on admission: Was hypotensive, tachycardic on presentation.  Elevated lactic acid, leukocytosis.  Continue broad-spectrum antibiotics for now.  Lactic acidosis resolved.  Fluids stopped. Negative  cultures  Acute respiratory failure with hypoxia: Could be combination of multifocal pneumonia, COPD exacerbation,volume overload.  Continue antibiotics, bronchodilators.  Currently on 2-3 L of oxygen by nasal cannula.  Not on oxygen at home.  Will check if she qualifies  for home oxygen.  She still has some wheezes today.  We will give a dose of Lasix again. Echocardiogram showed EF of 70-75% with grade 1 diastolic dysfunction   Multifocal pneumonia: Multifocal bilateral airspace process worse over the right lung likely multifocal pneumonia with suggestion of small right effusion.  Continue current  antibiotics.  She had wheezesthis morning.  Discontinued IV fluids.   MRSA screen negative. Continue cefepime.Seen by speech therapist and recommended dysphagia 3 diet.  COPD exacerbation: Was wheezing this morning.  Continue steroids.  She was previously on oxygen at home but not using currently.She might qualify for home oxygen  Suspected UTI: Urine culture did not show significant growth.  Acute metabolic encephalopathy: Could be secondary to sepsis, acute respiratory failure.  Mental status much improved.  Currently she is alert and awake and oriented  AKI: Resolved   Normocytic anemia: Hemoglobin in the range of 7.  Iron deficiency asper iron studies. Given  IV iron.Continue iron supplementation on DC  Hypothyroidism: Continue Synthyroid  DM type 2:On metformin, Actos at home.  Continue sliding scale insulin for now.  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  Mostly bedbound due to her arthritis.  PT/OT evaluation done and recommended home health          DVT prophylaxis: Lovenox Code Status: Full Family Communication: Called daughter on 03/11/19. Disposition Plan: [place] home tomorrow   Consultants: None  Procedures:None  Antimicrobials:  Anti-infectives (From admission, onward)   Start     Dose/Rate Route Frequency Ordered Stop   03/09/19 1300  vancomycin (VANCOCIN) IVPB 1000 mg/200 mL premix  Status:  Discontinued     1,000 mg 200 mL/hr over 60 Minutes Intravenous Every 48 hours 03/07/19 1236 03/08/19 1339   03/08/19 1400  vancomycin (VANCOCIN) IVPB 750 mg/150 ml premix  Status:  Discontinued  750 mg 150 mL/hr over 60 Minutes Intravenous Every 24 hours 03/08/19 1339 03/09/19 1446   03/08/19 0000  ceFEPIme (MAXIPIME) 2 g in  sodium chloride 0.9 % 100 mL IVPB     2 g 200 mL/hr over 30 Minutes Intravenous Every 12 hours 03/07/19 1236     03/07/19 1230  vancomycin (VANCOCIN) 1,500 mg in sodium chloride 0.9 % 500 mL IVPB     1,500 mg 250 mL/hr over 120 Minutes Intravenous  Once 03/07/19 1217 03/07/19 1641   03/07/19 1200  vancomycin (VANCOCIN) IVPB 1000 mg/200 mL premix  Status:  Discontinued     1,000 mg 200 mL/hr over 60 Minutes Intravenous  Once 03/07/19 1152 03/07/19 1217   03/07/19 1200  ceFEPIme (MAXIPIME) 2 g in sodium chloride 0.9 % 100 mL IVPB     2 g 200 mL/hr over 30 Minutes Intravenous  Once 03/07/19 1152 03/07/19 1242      Subjective:  Patient seen and examined the bedside this morning. Hemodynamically stable. Looked comfortable but still has some wheezes. She was not in any kind of respiratory distress. We are trying to wean the oxygen. She had a good urine output yesterday.   Objective: Vitals:   03/11/19 0208 03/11/19 0209 03/11/19 0711 03/11/19 0732  BP:    (!) 143/76  Pulse:    78  Resp:    18  Temp:    98.3 F (36.8 C)  TempSrc:    Oral  SpO2: 90% 97% 98% 99%  Weight:      Height:        Intake/Output Summary (Last 24 hours) at 03/11/2019 1050 Last data filed at 03/11/2019 0800 Gross per 24 hour  Intake 100 ml  Output 2500 ml  Net -2400 ml   Filed Weights   03/07/19 1200  Weight: 77.1 kg    Examination:  General exam: Deconditioned/debilitated, obese  HEENT:PERRL,Oral mucosa moist, Ear/Nose normal on gross exam Respiratory system: Bilateral decreased air entry, scattered wheezes Cardiovascular system: S1 & S2 heard, RRR. No JVD, murmurs, rubs, gallops or clicks. No pedal edema. Gastrointestinal system: Abdomen is nondistended, soft and nontender. No organomegaly or masses felt. Normal bowel sounds heard. Central nervous system: Alert and oriented. No focal neurological deficits. Extremities: No edema, no clubbing ,no cyanosis, distal peripheral pulses palpable. Skin: No  rashes, lesions or ulcers,no icterus ,no pallor   Data Reviewed: I have personally reviewed following labs and imaging studies  CBC: Recent Labs  Lab 03/07/19 1123 03/08/19 0346 03/09/19 0708 03/10/19 0333  WBC 17.8* 14.7* 13.2* 10.0  NEUTROABS 16.2*  --  11.9* 9.0*  HGB 8.8* 8.0* 7.5* 7.7*  HCT 28.7* 26.3* 24.8* 25.0*  MCV 86.2 86.5 85.8 85.0  PLT 230 206 227 062   Basic Metabolic Panel: Recent Labs  Lab 03/07/19 1123 03/08/19 0346 03/09/19 0708 03/10/19 0333 03/11/19 0453  NA 135 136 136 139 138  K 4.0 4.8 4.4 4.0 3.5  CL 104 112* 111 105 99  CO2 21* 18* 19* 23 28  GLUCOSE 150* 152* 154* 215* 210*  BUN 21 19 12 16 18   CREATININE 1.43* 0.81 0.86 0.86 0.95  CALCIUM 8.2* 7.6* 8.1* 8.4* 8.6*   GFR: Estimated Creatinine Clearance: 49.9 mL/min (by C-G formula based on SCr of 0.95 mg/dL). Liver Function Tests: Recent Labs  Lab 03/07/19 1123  AST 23  ALT 11  ALKPHOS 57  BILITOT 0.4  PROT 5.3*  ALBUMIN 2.7*   Recent Labs  Lab 03/07/19 1203  LIPASE 71*  No results for input(s): AMMONIA in the last 168 hours. Coagulation Profile: Recent Labs  Lab 03/07/19 1203  INR 1.1   Cardiac Enzymes: No results for input(s): CKTOTAL, CKMB, CKMBINDEX, TROPONINI in the last 168 hours. BNP (last 3 results) No results for input(s): PROBNP in the last 8760 hours. HbA1C: No results for input(s): HGBA1C in the last 72 hours. CBG: Recent Labs  Lab 03/10/19 0744 03/10/19 1159 03/10/19 1624 03/10/19 2055 03/11/19 0732  GLUCAP 193* 225* 123* 289* 175*   Lipid Profile: No results for input(s): CHOL, HDL, LDLCALC, TRIG, CHOLHDL, LDLDIRECT in the last 72 hours. Thyroid Function Tests: No results for input(s): TSH, T4TOTAL, FREET4, T3FREE, THYROIDAB in the last 72 hours. Anemia Panel: Recent Labs    03/09/19 1112  FERRITIN 62  TIBC 329  IRON 20*   Sepsis Labs: Recent Labs  Lab 03/07/19 1123 03/07/19 1430 03/07/19 1444  PROCALCITON  --  25.11  --    LATICACIDVEN 2.6*  --  1.6    Recent Results (from the past 240 hour(s))  SARS Coronavirus 2 by RT PCR (hospital order, performed in [facility] hospital lab) Nasopharyngeal Nasopharyngeal Swab     Status: None   Collection Time: 03/07/19 12:12 PM   Specimen: Nasopharyngeal Swab  Result Value Ref Range Status   SARS Coronavirus 2 NEGATIVE NEGATIVE Final    Comment: (NOTE) If result is NEGATIVE SARS-CoV-2 target nucleic acids are NOT DETECTED. The SARS-CoV-2 RNA is generally detectable in upper and lower  respiratory specimens during the acute phase of infection. The lowest  concentration of SARS-CoV-2 viral copies this assay can detect is 250  copies / mL. A negative result does not preclude SARS-CoV-2 infection  and should not be used as the sole basis for treatment or other  patient management decisions.  A negative result may occur with  improper specimen collection / handling, submission of specimen other  than nasopharyngeal swab, presence of viral mutation(s) within the  areas targeted by this assay, and inadequate number of viral copies  (<250 copies / mL). A negative result must be combined with clinical  observations, patient history, and epidemiological information. If result is POSITIVE SARS-CoV-2 target nucleic acids are DETECTED. The SARS-CoV-2 RNA is generally detectable in upper and lower  respiratory specimens dur ing the acute phase of infection.  Positive  results are indicative of active infection with SARS-CoV-2.  Clinical  correlation with patient history and other diagnostic information is  necessary to determine patient infection status.  Positive results do  not rule out bacterial infection or co-infection with other viruses. If result is PRESUMPTIVE POSTIVE SARS-CoV-2 nucleic acids MAY BE PRESENT.   A presumptive positive result was obtained on the submitted specimen  and confirmed on repeat testing.  While 2019 novel coronavirus  (SARS-CoV-2) nucleic  acids may be present in the submitted sample  additional confirmatory testing may be necessary for epidemiological  and / or clinical management purposes  to differentiate between  SARS-CoV-2 and other Sarbecovirus currently known to infect humans.  If clinically indicated additional testing with an alternate test  methodology [phone]) is advised. The SARS-CoV-2 RNA is generally  detectable in upper and lower respiratory sp ecimens during the acute  phase of infection. The expected result is Negative. Fact Sheet for Patients:  BoilerBrush.com.cy Fact Sheet for Healthcare Providers: https://pope.com/ This test is not yet approved or cleared by the [place] FDA and has been authorized for detection and/or diagnosis of SARS-CoV-2 by FDA under an Emergency  Use Authorization (EUA).  This EUA will remain in effect (meaning this test can be used) for the duration of the COVID-19 declaration under Section 564(b)(1) of the Act, 21 U.S.C. section 360bbb-3(b)(1), unless the authorization is terminated or revoked sooner. Performed at [facility], [address], [place], [place] [address]   Blood culture (routine x 2)     Status: None (Preliminary result)   Collection Time: 03/07/19 12:53 PM   Specimen: BLOOD RIGHT WRIST  Result Value Ref Range Status   Specimen Description BLOOD RIGHT WRIST  Final   Special Requests   Final    BOTTLES DRAWN AEROBIC AND ANAEROBIC Blood Culture adequate volume   Culture   Final    NO GROWTH 4 DAYS Performed at [facility], [address], [place], [place] [address]    Report Status PENDING  Incomplete  Blood culture (routine x 2)     Status: None (Preliminary result)   Collection Time: 03/07/19 12:53 PM   Specimen: BLOOD LEFT WRIST  Result Value Ref Range Status   Specimen Description BLOOD LEFT WRIST  Final   Special Requests   Final    BOTTLES DRAWN AEROBIC AND ANAEROBIC Blood Culture  results may not be optimal due to an inadequate volume of blood received in culture bottles   Culture   Final    NO GROWTH 4 DAYS Performed at [facility], [address], [place], [place] [address]    Report Status PENDING  Incomplete  Urine culture     Status: Abnormal   Collection Time: 03/07/19  7:02 PM   Specimen: Urine, Random  Result Value Ref Range Status   Specimen Description URINE, RANDOM  Final   Special Requests NONE  Final   Culture (A)  Final    <10,000 COLONIES/mL INSIGNIFICANT GROWTH Performed at [facility], [address] [place], [place] [address]    Report Status 03/08/2019 FINAL  Final  MRSA PCR Screening     Status: None   Collection Time: 03/07/19  8:34 PM   Specimen: Nasal Mucosa; Nasopharyngeal  Result Value Ref Range Status   MRSA by PCR NEGATIVE NEGATIVE Final    Comment:        The GeneXpert MRSA Assay (FDA approved for NASAL specimens only), is one component of a comprehensive MRSA colonization surveillance program. It is not intended to diagnose MRSA infection nor to guide or monitor treatment for MRSA infections. Performed at [facility], [address], [place], [place] [address]          Radiology Studies: Dg Swallowing Func-speech Pathology  Result Date: 03/09/2019 Objective Swallowing Evaluation: Type of Study: MBS-Modified Barium Swallow Study  Patient Details Name: Karla Clark MRN: [id number] Date of Birth: 04-05-1946 Today's Date: 03/09/2019 Time: SLP Start Time (ACUTE ONLY): 1500 -SLP Stop Time (ACUTE ONLY): 1515 SLP Time Calculation (min) (ACUTE ONLY): 15 min Past Medical History: Past Medical History: Diagnosis Date . Anxiety  . COPD (chronic obstructive pulmonary disease) (HCC)  . Diabetes mellitus without complication (HCC)  . Elevated cholesterol  . Hypertension  . Hypothyroidism  . Osteoarthritis  . RA (rheumatoid arthritis) (HCC)  . Schizophrenia (HCC)   delusional Past Surgical History: Past Surgical  History: Procedure Laterality Date . ABDOMINAL HYSTERECTOMY   HPI: Patient is a 89 female with history of hypertension, hyperlipidemia, COPD, diabetes type 2, pneumonia, remote arthritis, hypothyroidism, anxiety, schizophrenia who was brought to the emergency department due to altered  mental status.  At baseline, patient is not very mobile due to her rheumatoid arthritis.  On presentation she was found to be altered, hypotensive, hypoxic.  She had leukocytosis, acute kidney injury and lactic acid of 2.6.  Chest x-ray showed multifocal pneumonia.  Covid test was negative.  Started on broad start antibiotics and admitted for further management. Multifocal bilateral airspace process worse over the right lung likely multifocal pneumonia with suggestion of small right effusion.   No data recorded Assessment / Plan / Recommendation CHL IP CLINICAL IMPRESSIONS 03/09/2019 Clinical Impression Pt presents with a mild-moderate oropharyngeal dysphagia, most likely related to deconditioning and subsequent respiratory decompensation.  Pt's swallow response was triggered at the level of the pyriform sinuses and pt appeared to have decreased laryngeal closure during the swallow which lead to deep penetration and/or silent aspiration of small amounts of thin liquids via cup sips and straw.  Strategies were attempted to maximize airway protection; however pt continued with at least deep penetration of thin liquids.  Pt did have a strong volitional cough which cleared small amounts of penetrates from the cords, but needed mod cues to successfully employ strategies so I have concerns about her ability to carryover recommendations at bedside.  Nectar thick liquids mitigated aspiration and deep penetration (trace, high penetration was observed but this is not uncommon with pt's age and comorbidities).  For now, I would recommend downgrading pt to nectar thick liquids to maximize safety with PO intake, although I feel her potential to  advance quickly is good.   SLP Visit Diagnosis Dysphagia, oropharyngeal phase (R13.12) Attention and concentration deficit following -- Frontal lobe and executive function deficit following -- Impact on safety and function Moderate aspiration risk   CHL IP TREATMENT RECOMMENDATION 03/09/2019 Treatment Recommendations Therapy as outlined in treatment plan below   Prognosis 03/09/2019 Prognosis for Safe Diet Advancement Good Barriers to Reach Goals -- Barriers/Prognosis Comment -- CHL IP DIET RECOMMENDATION 03/09/2019 SLP Diet Recommendations Dysphagia 3 (Mech soft) solids;Nectar thick liquid Liquid Administration via Cup Medication Administration Whole meds with puree Compensations Minimize environmental distractions;Slow rate;Small sips/bites Postural Changes Remain semi-upright after after feeds/meals (Comment);Seated upright at 90 degrees   CHL IP OTHER RECOMMENDATIONS 03/09/2019 Recommended Consults -- Oral Care Recommendations Oral care BID Other Recommendations Order thickener from pharmacy   CHL IP FOLLOW UP RECOMMENDATIONS 03/09/2019 Follow up Recommendations 24 hour supervision/assistance   CHL IP FREQUENCY AND DURATION 03/09/2019 Speech Therapy Frequency (ACUTE ONLY) min 2x/week Treatment Duration 2 weeks      CHL IP ORAL PHASE 03/09/2019 Oral Phase WFL Oral - Pudding Teaspoon -- Oral - Pudding Cup -- Oral - Honey Teaspoon -- Oral - Honey Cup -- Oral - Nectar Teaspoon -- Oral - Nectar Cup -- Oral - Nectar Straw -- Oral - Thin Teaspoon -- Oral - Thin Cup -- Oral - Thin Straw -- Oral - Puree -- Oral - Mech Soft -- Oral - Regular -- Oral - Multi-Consistency -- Oral - Pill -- Oral Phase - Comment --  CHL IP PHARYNGEAL PHASE 03/09/2019 Pharyngeal Phase Impaired Pharyngeal- Pudding Teaspoon -- Pharyngeal -- Pharyngeal- Pudding Cup -- Pharyngeal -- Pharyngeal- Honey Teaspoon -- Pharyngeal -- Pharyngeal- Honey Cup -- Pharyngeal -- Pharyngeal- Nectar Teaspoon -- Pharyngeal -- Pharyngeal- Nectar Cup Reduced airway/laryngeal  closure;Penetration/Aspiration during swallow Pharyngeal Material enters airway, remains ABOVE vocal cords and not ejected out Pharyngeal- Nectar Straw -- Pharyngeal -- Pharyngeal- Thin Teaspoon -- Pharyngeal -- Pharyngeal- Thin Cup Delayed swallow initiation-pyriform sinuses;Penetration/Aspiration during swallow;Reduced airway/laryngeal closure Pharyngeal Material  enters airway, passes BELOW cords without attempt by patient to eject out (silent aspiration);Material enters airway, CONTACTS cords and not ejected out Pharyngeal- Thin Straw Reduced airway/laryngeal closure;Penetration/Aspiration during swallow;Delayed swallow initiation-pyriform sinuses Pharyngeal Material enters airway, CONTACTS cords and not ejected out Pharyngeal- Puree -- Pharyngeal -- Pharyngeal- Mechanical Soft -- Pharyngeal -- Pharyngeal- Regular -- Pharyngeal -- Pharyngeal- Multi-consistency -- Pharyngeal -- Pharyngeal- Pill -- Pharyngeal -- Pharyngeal Comment --  No flowsheet data found. Page, Melanee SpryNicole L 03/09/2019, 4:07 PM                   Scheduled Meds: . budesonide (PULMICORT) nebulizer solution  0.25 mg Nebulization BID  . enoxaparin (LOVENOX) injection  40 mg Subcutaneous Q24H  . furosemide  40 mg Intravenous Once  . gabapentin  1,200 mg Oral BID  . guaiFENesin-dextromethorphan  10 mL Oral Q6H  . insulin aspart  0-15 Units Subcutaneous TID WC  . insulin aspart  0-5 Units Subcutaneous QHS  . ipratropium-albuterol  3 mL Nebulization Q6H  . levothyroxine  75 mcg Oral Q0600  . methylPREDNISolone (SOLU-MEDROL) injection  60 mg Intravenous Q12H  . pantoprazole  40 mg Oral Daily  . simvastatin  40 mg Oral q1800  . sodium chloride flush  3 mL Intravenous Once  . sodium chloride flush  3 mL Intravenous Q12H  . venlafaxine XR  150 mg Oral q morning - 10a  . ziprasidone  40 mg Oral QHS   Continuous Infusions: . sodium chloride 1,000 mL (03/08/19 1305)  . ceFEPime (MAXIPIME) IV 2 g (03/10/19 2320)     LOS: 4 days     Time spent: 25 mins.More than 50% of that time was spent in counseling and/or coordination of care.      Burnadette PopAmrit Jaime Grizzell, MD [facility] Pager [phone]  If 7PM-7AM, please contact night-coverage [email or web address] Password [email or web address] 03/11/2019, 10:50 AM

## 2019-03-11 NOTE — Progress Notes (Signed)
Patient placed back on oxygen during the night. Per documentation, for O2 sat 90% on room air. Will wean again today, as able.

## 2019-03-12 ENCOUNTER — Inpatient Hospital Stay (HOSPITAL_COMMUNITY): Payer: Medicare Other

## 2019-03-12 LAB — CULTURE, BLOOD (ROUTINE X 2)
Culture: NO GROWTH
Culture: NO GROWTH
Special Requests: ADEQUATE

## 2019-03-12 LAB — CBC WITH DIFFERENTIAL/PLATELET
Abs Immature Granulocytes: 0.3 10*3/uL — ABNORMAL HIGH (ref 0.00–0.07)
Basophils Absolute: 0 10*3/uL (ref 0.0–0.1)
Basophils Relative: 0 %
Eosinophils Absolute: 0 10*3/uL (ref 0.0–0.5)
Eosinophils Relative: 0 %
HCT: 28.8 % — ABNORMAL LOW (ref 36.0–46.0)
Hemoglobin: 8.8 g/dL — ABNORMAL LOW (ref 12.0–15.0)
Lymphocytes Relative: 10 %
Lymphs Abs: 1 10*3/uL (ref 0.7–4.0)
MCH: 25.6 pg — ABNORMAL LOW (ref 26.0–34.0)
MCHC: 30.6 g/dL (ref 30.0–36.0)
MCV: 83.7 fL (ref 80.0–100.0)
Metamyelocytes Relative: 2 %
Monocytes Absolute: 0.6 10*3/uL (ref 0.1–1.0)
Monocytes Relative: 6 %
Myelocytes: 1 %
Neutro Abs: 7.8 10*3/uL — ABNORMAL HIGH (ref 1.7–7.7)
Neutrophils Relative %: 81 %
Platelets: 270 10*3/uL (ref 150–400)
RBC: 3.44 MIL/uL — ABNORMAL LOW (ref 3.87–5.11)
RDW: 18.6 % — ABNORMAL HIGH (ref 11.5–15.5)
WBC: 9.6 10*3/uL (ref 4.0–10.5)
nRBC: 2.1 % — ABNORMAL HIGH (ref 0.0–0.2)
nRBC: 4 /100 WBC — ABNORMAL HIGH

## 2019-03-12 LAB — GLUCOSE, CAPILLARY
Glucose-Capillary: 166 mg/dL — ABNORMAL HIGH (ref 70–99)
Glucose-Capillary: 333 mg/dL — ABNORMAL HIGH (ref 70–99)

## 2019-03-12 LAB — BASIC METABOLIC PANEL
Anion gap: 12 (ref 5–15)
BUN: 15 mg/dL (ref 8–23)
CO2: 30 mmol/L (ref 22–32)
Calcium: 8.7 mg/dL — ABNORMAL LOW (ref 8.9–10.3)
Chloride: 97 mmol/L — ABNORMAL LOW (ref 98–111)
Creatinine, Ser: 0.75 mg/dL (ref 0.44–1.00)
GFR calc Af Amer: >60 mL/min (ref >60)
GFR calc non Af Amer: >60 mL/min (ref >60)
Glucose, Bld: 205 mg/dL — ABNORMAL HIGH (ref 70–99)
Potassium: 3.5 mmol/L (ref 3.5–5.1)
Sodium: 139 mmol/L (ref 135–145)

## 2019-03-12 MED ORDER — FERROUS SULFATE 325 (65 FE) MG PO TABS: 325.0000 mg | ORAL_TABLET | Freq: Every day | ORAL | 0 refills | Status: AC

## 2019-03-12 MED ORDER — PREDNISONE 20 MG PO TABS: 40.0000 mg | ORAL_TABLET | Freq: Every day | ORAL | 0 refills | Status: AC

## 2019-03-12 MED ORDER — POTASSIUM CHLORIDE ER 20 MEQ PO TBCR: 20.0000 meq | EXTENDED_RELEASE_TABLET | Freq: Every day | ORAL | 0 refills | Status: AC

## 2019-03-12 MED ORDER — AMOXICILLIN-POT CLAVULANATE 875-125 MG PO TABS: 1.0000 | ORAL_TABLET | Freq: Two times a day (BID) | ORAL | 0 refills | Status: AC

## 2019-03-12 MED ORDER — FUROSEMIDE 40 MG PO TABS: 40.0000 mg | ORAL_TABLET | Freq: Every day | ORAL | 11 refills | Status: AC

## 2019-03-12 MED ORDER — IPRATROPIUM-ALBUTEROL 0.5-2.5 (3) MG/3ML IN SOLN: 3.0000 mL | Freq: Two times a day (BID) | RESPIRATORY_TRACT | Status: DC

## 2019-03-12 NOTE — Progress Notes (Signed)
  Speech Language Pathology Treatment: Dysphagia  Patient Details Name: Karla Clark MRN: [id number] DOB: [birth year] Today's Date: 03/12/2019 Time: 2119-4174 SLP Time Calculation (min) (ACUTE ONLY): 12 min  Assessment / Plan / Recommendation Clinical Impression  Pt exhibited cough with trials regular solids and thin liquid today.  Denies feeling of food/drink going down the wrong way and cannot say what is making her cough.  Attempted trials of thin liquid with use of compensatory strategies (swallow, cough, re-swallow) to protect airway. Pt was unable to consistently execute swallow strategies with SLP provided direct instruction throughout.  Pt is planned to d/c today.  Per MBSS completed 11/6, pt deemed to have good potential to advance quickly; however, clinical advancement is not advisable give silent aspiration observed on that study.  In light of planned discharge, recommend repeat MBSS later this date to determine if pt can advance to thin liquids.  Pt may need ongoing ST services at next level of care.  Pt does not appear to have cognitive ability to manage modified diet, and if thickened liquid consistencies are required, recommend family education prior to discharge.    HPI HPI: Patient is a 84 female with history of hypertension, hyperlipidemia, COPD, diabetes type 2, pneumonia, remote arthritis, hypothyroidism, anxiety, schizophrenia who was brought to the emergency department due to altered mental status.  At baseline, patient is not very mobile due to her rheumatoid arthritis.  On presentation she was found to be altered, hypotensive, hypoxic.  She had leukocytosis, acute kidney injury and lactic acid of 2.6.  Chest x-ray showed multifocal pneumonia.  Covid test was negative.  Started on broad start antibiotics and admitted for further management. Multifocal bilateral airspace process worse over the right lung likely multifocal pneumonia with suggestion of small right effusion.        SLP Plan  MBS       Recommendations  Diet recommendations: Dysphagia 3 (mechanical soft);Nectar-thick liquid Liquids provided via: Cup Medication Administration: Whole meds with liquid Compensations: Minimize environmental distractions;Slow rate;Small sips/bites Postural Changes and/or Swallow Maneuvers: Seated upright 90 degrees                Oral Care Recommendations: Oral care BID Follow up Recommendations: 24 hour supervision/assistance SLP Visit Diagnosis: Dysphagia, oropharyngeal phase (R13.12) Plan: MBS       GO                Celedonio Savage, MA, [place] Office: [phone] 03/12/2019, 12:28 PM

## 2019-03-12 NOTE — Progress Notes (Signed)
Occupational Therapy Treatment Patient Details Name: Karla Clark MRN: [id number] DOB: 12/16/1945 Today's Date: 03/12/2019    History of present illness 73 y.o. female with a past medical history significant for diabetes, COPD, hypertension, hypothyroidism, anxiety, osteoarthritis, and rheumatoid arthritis who presents with chills, cough, shortness of breath, and hypoxia.  Reports of inreased cough and SoB for last year. Pt admitted 02/04/19 for treatment of sepsis secondary to PNA, respiratory failure withhypoxia and UTI.   OT comments  Pt progressing towards established OT goals. Pt performing oral care at sink with Min A for opening containers and Min Guard A for balance. Pt SpO2 dropping to 80% on 2L at sink; elevated to 3L and she maintained >90% through remainder of oral care. Requiring seated rest break before mobility in hallway and Spo2 >90% on 2L. Required Min A and RW for functional mobility. Pt requesting to attempt mobility on RA; maintained SpO2 >90% until she sat at EOB and SpO2 dropped to 77%. Returned to 3L and cues for purse lip breathing; she returned to 90s. Continue to recommend dc to home with HHOT and will continue to follow acutely as admitted.    Follow Up Recommendations  Home health OT;Supervision/Assistance - 24 hour    Equipment Recommendations  None recommended by OT    Recommendations for Other Services      Precautions / Restrictions Precautions Precautions: Fall Restrictions Weight Bearing Restrictions: No       Mobility Bed Mobility Overal bed mobility: Needs Assistance Bed Mobility: Sit to Supine       Sit to supine: Mod assist   General bed mobility comments: Mod A to bring BLEs over EOB  Transfers Overall transfer level: Needs assistance Equipment used: Rolling walker (2 wheeled) Transfers: Sit to/from Stand Sit to Stand: Min guard         General transfer comment: Min Guard A for safety.     Balance Overall balance  assessment: Needs assistance Sitting-balance support: Feet unsupported;No upper extremity supported;Feet supported;Single extremity supported Sitting balance-Leahy Scale: Fair     Standing balance support: During functional activity;Bilateral upper extremity supported Standing balance-Leahy Scale: Poor Standing balance comment: requires UE support for steadying in standing                           ADL either performed or assessed with clinical judgement   ADL Overall ADL's : Needs assistance/impaired     Grooming: Oral care;Min guard;Standing;Minimal assistance Grooming Details (indicate cue type and reason): Min A for opening containers due to decreased FM skills due to RA. Min Guard A for safety and balance at sink. Required seated rest break after finishing oral care                 Toilet Transfer: Ambulation;RW;+2 for safety/equipment;Min guard Toilet Transfer Details (indicate cue type and reason): Min Guard A for safety during transfers.          Functional mobility during ADLs: Minimal assistance;Rolling walker;+2 for safety/equipment(chair follow in hallway) General ADL Comments: Pt continues to present with decreased activity tolerance as seen by fatigue, required seated rest breaks, and requiring supplimental O2.  Despite fatigue, pt very motivated to paricipation in therapy to return home     Vision       Perception     Praxis      Cognition Arousal/Alertness: Awake/alert Behavior During Therapy: Flat affect Overall Cognitive Status: History of cognitive impairments - at baseline  General Comments: Pt requiring increased cues and         Exercises     Shoulder Instructions       General Comments SpO2 dropping to 80% on 2L while performing oral care; elevated to 3L and pt spO2 returning to 90s. During functional mobility in hallway, pt wanting to attempt mobility on RA. Pt maintaining SpO2  >91% on RA until sitting on EOB and pt SpO2 dropping to 77% on RA. Returned pt to 3L O2 and cued for purse lip breathing. SpO2 retured to 90s. Left pt on 2L at end of session    Pertinent Vitals/ Pain       Pain Assessment: Faces Faces Pain Scale: Hurts little more Pain Location: bilateral knees with movement and weightbearing Pain Descriptors / Indicators: Grimacing;Guarding Pain Intervention(s): Monitored during session;Repositioned  Home Living                                          Prior Functioning/Environment              Frequency  Min 2X/week        Progress Toward Goals  OT Goals(current goals can now be found in the care plan section)  Progress towards OT goals: Progressing toward goals  Acute Rehab OT Goals Patient Stated Goal: feel better OT Goal Formulation: With patient Time For Goal Achievement: 03/22/19 Potential to Achieve Goals: Good ADL Goals Pt Will Perform Grooming: with min assist;sitting Pt Will Perform Upper Body Bathing: with min assist;sitting Pt Will Perform Lower Body Bathing: with min assist;sit to/from stand Pt Will Transfer to Toilet: bedside commode;stand pivot transfer;with min guard assist Pt Will Perform Toileting - Clothing Manipulation and hygiene: with min guard assist;sit to/from stand  Plan Discharge plan remains appropriate    Co-evaluation    PT/OT/SLP Co-Evaluation/Treatment: Yes Reason for Co-Treatment: For patient/therapist safety;To address functional/ADL transfers   OT goals addressed during session: ADL's and self-care      AM-PAC OT "6 Clicks" Daily Activity     Outcome Measure   Help from another person eating meals?: A Little Help from another person taking care of personal grooming?: A Little Help from another person toileting, which includes using toliet, bedpan, or urinal?: A Lot Help from another person bathing (including washing, rinsing, drying)?: A Lot Help from another person to  put on and taking off regular upper body clothing?: A Little Help from another person to put on and taking off regular lower body clothing?: A Lot 6 Click Score: 15    End of Session Equipment Utilized During Treatment: Rolling walker;Oxygen;Gait belt(2-3L)  OT Visit Diagnosis: Unsteadiness on feet (R26.81);Other abnormalities of gait and mobility (R26.89);Muscle weakness (generalized) (M62.81);Pain;Other symptoms and signs involving cognitive function   Activity Tolerance Patient tolerated treatment well;Patient limited by fatigue   Patient Left in bed;with call bell/phone within reach;Other (comment)(on bedpan and notifed NT)   Nurse Communication Mobility status        Time: 6948-5462 OT Time Calculation (min): 23 min  Charges: OT General Charges $OT Visit: 1 Visit OT Treatments $Self Care/Home Management : 8-22 mins  Kennard, OTR/L Acute Rehab Pager: [phone] Office: [place] 03/12/2019, 10:49 AM

## 2019-03-12 NOTE — Discharge Summary (Signed)
Physician Discharge Summary  Karla Clark JXB:[id number] DOB: 06-16-1945 DOA: 03/07/2019  PCP: Charlott Rakes, MD  Admit date: 03/07/2019 Discharge date: 03/12/2019  Admitted From: Home Disposition:  Home  Discharge Condition:Stable CODE STATUS:FULL Diet recommendation: Heart Healthy  Brief/Interim Summary:  Patient is a 1 female with history of hypertension, hyperlipidemia, COPD, diabetes type 2, pneumonia, remote arthritis, hypothyroidism, anxiety, schizophrenia who was brought to the emergency department due to altered mental status.  At baseline, patient is not very mobile due to her rheumatoid arthritis.  On presentation she was found to be altered, hypotensive, hypoxic.  She had leukocytosis, acute kidney injury and lactic acid of 2.6.  Chest x-ray showed multifocal pneumonia.  As per the daughter, she has frequent aspiration pneumonias. Covid test was negative.  Started on broad start antibiotics and admitted for further management.   Respiratory status gradually improved with antibiotics.  Speech therapy also evaluated her and recommended dysphagia 3 diet.  PT/OT evaluated her and recommended home health.   She qualified for home oxygen.  Patient is hemodynamically stable for discharge home today.  Following problems were addressed during her hospitalization:  Sepsis suspected  on admission: Was hypotensive, tachycardic on presentation.  Elevated lactic acid, leukocytosis.    Lactic acidosis resolved.  Fluids stopped. Negative  cultures.  Antibiotics changed to oral.  Acute respiratory failure with hypoxia: Could be combination of multifocal pneumonia, COPD exacerbation,volume overload due to diastolic CHF.She qualified for home oxygen. Echocardiogram showed EF of 70-75% with grade 1 diastolic dysfunction  Multifocal pneumonia: Multifocal bilateral airspace process worse over the right lung likely multifocal pneumonia with suggestion of small right effusion.  Seen by speech  therapist and recommended dysphagia 3 diet.Abx changed to oral  Diastolic CHF: She was given few days of IV Lasix.  Started on Lasix 40 mg daily.  COPD exacerbation:  Continue steroids for few more days.  She was previously on oxygen at home but not using currently.She qualified  for home oxygen.  Continue home inhalers.  Follow-up with pulmonology as an outpatient  Suspected UTI: Urine culture did not show significant growth.  Acute metabolic encephalopathy: Could be secondary to sepsis, acute respiratory failure.  Mental status much improved.  Currently she is alert and awake and oriented  AKI: Resolved   Normocytic anemia: Hemoglobin in the range of 7.  Iron deficiency as per iron studies. Given  IV iron.Continue iron supplementation on DC  Hypothyroidism: Continue Synthyroid  DM type 2:On metformin, Actos at home. Resume home meds  Schizophrenia/anxiety: Continue home meds  Arthiritis/debility/deconditioning: Has deformity on the hands.  Mostly bedbound due to her arthritis.  PT/OT evaluation done and recommended home health    Discharge Diagnoses:  Principal Problem:   Sepsis due to pneumonia [facility]) Active Problems:   Rheumatoid arthritis with rheumatoid factor of multiple sites without organ or systems involvement (HCC)   Acute lower UTI   Acute on chronic respiratory failure with hypoxia (HCC)   COPD with acute exacerbation (HCC)   Acute metabolic encephalopathy   Transient hypotension   AKI (acute kidney injury) [facility])    Discharge Instructions  Discharge Instructions    Ambulatory referral to Pulmonology   Complete by: As directed    Diet - low sodium heart healthy   Complete by: As directed    Dysphagia 3,nectar thick   Discharge instructions   Complete by: As directed    1)Please follow up with your PCP in a week. Do a CBC and BMP tests during the  follow up. 2)Take prescribed medications as instructed. 3)Follow up with pulmonology as an outpatient  in 2 to 3 weeks.  Name and number the provider has been attached.  Please call for appointment   Increase activity slowly   Complete by: As directed      Allergies as of 03/12/2019      Reactions   Ace Inhibitors Cough      Medication List    STOP taking these medications   doxycycline 100 MG capsule Commonly known as: VIBRAMYCIN     TAKE these medications   albuterol (2.5 MG/3ML) 0.083% nebulizer solution Commonly known as: PROVENTIL Take 2.5 mg by nebulization every 6 (six) hours as needed for wheezing or shortness of breath.   amoxicillin-clavulanate 875-125 MG tablet Commonly known as: Augmentin Take 1 tablet by mouth 2 (two) times daily for 2 days.   ferrous sulfate 325 (65 FE) MG tablet Take 1 tablet (325 mg total) by mouth daily.   folic acid 1 MG tablet Commonly known as: FOLVITE TAKE 1 TABLET BY MOUTH EVERY DAY   furosemide 40 MG tablet Commonly known as: Lasix Take 1 tablet (40 mg total) by mouth daily.   gabapentin 600 MG tablet Commonly known as: NEURONTIN Take 1,200 mg by mouth 2 (two) times daily.   ipratropium-albuterol 0.5-2.5 (3) MG/3ML Soln Commonly known as: DUONEB Take 3 mLs by nebulization every 6 (six) hours as needed (shortness of breath).   levothyroxine 75 MCG tablet Commonly known as: SYNTHROID Take 75 mcg by mouth daily.   losartan 100 MG tablet Commonly known as: COZAAR Take 100 mg by mouth daily.   metFORMIN 500 MG 24 hr tablet Commonly known as: GLUCOPHAGE-XR Take 500 mg by mouth 2 (two) times daily.   nystatin powder Generic drug: nystatin Apply 1 g topically 2 (two) times daily as needed (rash).   pantoprazole 40 MG tablet Commonly known as: PROTONIX Take 40 mg by mouth daily.   pioglitazone 30 MG tablet Commonly known as: ACTOS Take 30 mg by mouth daily.   Potassium Chloride ER 20 MEQ Tbcr Take 20 mEq by mouth daily.   predniSONE 20 MG tablet Commonly known as: Deltasone Take 2 tablets (40 mg total) by mouth  daily for 5 days. Start taking on: March 13, 2019   simvastatin 40 MG tablet Commonly known as: ZOCOR Take 40 mg by mouth daily at 6 PM.   Trelegy Ellipta 100-62.5-25 MCG/INH Aepb Generic drug: Fluticasone-Umeclidin-Vilant Inhale 1 puff into the lungs daily.   venlafaxine XR 150 MG 24 hr capsule Commonly known as: EFFEXOR-XR Take 150 mg by mouth every morning.   ziprasidone 40 MG capsule Commonly known as: GEODON Take 40 mg by mouth at bedtime.            Durable Medical Equipment  (From admission, onward)         Start     Ordered   03/12/19 1027  For home use only DME oxygen  Once    Question Answer Comment  Length of Need Lifetime   Mode or (Route) Nasal cannula   Liters per Minute 3   Oxygen delivery system Gas      03/12/19 1026         Follow-up Information    Charlott Rakes, MD. Schedule an appointment as soon as possible for a visit in 1 week(s).   Specialty: Family Medicine Contact information: [address] [place] [address] [phone]  Allergies  Allergen Reactions  . Ace Inhibitors Cough    Consultations:  None   Procedures/Studies: Dg Chest 1 View  Result Date: 03/11/2019 CLINICAL DATA:  Wheezing/ shob EXAM: CHEST  1 VIEW COMPARISON:  Chest radiograph 03/07/2019 FINDINGS: Stable cardiomediastinal contours. There is improved aeration in the right mid to lower lung and left lung base, likely resolving infection. No pneumothorax. Possible small volume right pleural fluid. No acute finding in the visualized skeleton. IMPRESSION: Improving aeration in the right mid to lower lung and left lung base, likely resolving infection. No new findings. Electronically Signed   By: Emmaline Kluver M.D.   On: 03/11/2019 14:36   Dg Chest Portable 1 View  Result Date: 03/07/2019 CLINICAL DATA:  Shortness of breath and altered mental status. EXAM: PORTABLE CHEST 1 VIEW COMPARISON:  10/18/2018 FINDINGS: Lungs are  adequately inflated demonstrate new multifocal airspace process over the right lung and left base/retrocardiac region likely multifocal pneumonia. Suggestion of a small amount right pleural fluid. Cardiomediastinal silhouette and remainder of the exam is unchanged. IMPRESSION: Multifocal bilateral airspace process worse over the right lung likely multifocal pneumonia with suggestion of small right effusion. Electronically Signed   By: Elberta Fortis M.D.   On: 03/07/2019 11:47   Dg Swallowing Func-speech Pathology  Result Date: 03/09/2019 Objective Swallowing Evaluation: Type of Study: MBS-Modified Barium Swallow Study  Patient Details Name: Karla Clark MRN: [id number] Date of Birth: August 23, 1945 Today's Date: 03/09/2019 Time: SLP Start Time (ACUTE ONLY): 1500 -SLP Stop Time (ACUTE ONLY): 1515 SLP Time Calculation (min) (ACUTE ONLY): 15 min Past Medical History: Past Medical History: Diagnosis Date . Anxiety  . COPD (chronic obstructive pulmonary disease) (HCC)  . Diabetes mellitus without complication (HCC)  . Elevated cholesterol  . Hypertension  . Hypothyroidism  . Osteoarthritis  . RA (rheumatoid arthritis) (HCC)  . Schizophrenia (HCC)   delusional Past Surgical History: Past Surgical History: Procedure Laterality Date . ABDOMINAL HYSTERECTOMY   HPI: Patient is a 76 female with history of hypertension, hyperlipidemia, COPD, diabetes type 2, pneumonia, remote arthritis, hypothyroidism, anxiety, schizophrenia who was brought to the emergency department due to altered mental status.  At baseline, patient is not very mobile due to her rheumatoid arthritis.  On presentation she was found to be altered, hypotensive, hypoxic.  She had leukocytosis, acute kidney injury and lactic acid of 2.6.  Chest x-ray showed multifocal pneumonia.  Covid test was negative.  Started on broad start antibiotics and admitted for further management. Multifocal bilateral airspace process worse over the right lung likely multifocal  pneumonia with suggestion of small right effusion.   No data recorded Assessment / Plan / Recommendation CHL IP CLINICAL IMPRESSIONS 03/09/2019 Clinical Impression Pt presents with a mild-moderate oropharyngeal dysphagia, most likely related to deconditioning and subsequent respiratory decompensation.  Pt's swallow response was triggered at the level of the pyriform sinuses and pt appeared to have decreased laryngeal closure during the swallow which lead to deep penetration and/or silent aspiration of small amounts of thin liquids via cup sips and straw.  Strategies were attempted to maximize airway protection; however pt continued with at least deep penetration of thin liquids.  Pt did have a strong volitional cough which cleared small amounts of penetrates from the cords, but needed mod cues to successfully employ strategies so I have concerns about her ability to carryover recommendations at bedside.  Nectar thick liquids mitigated aspiration and deep penetration (trace, high penetration was observed but this is not uncommon with pt's age and  comorbidities).  For now, I would recommend downgrading pt to nectar thick liquids to maximize safety with PO intake, although I feel her potential to advance quickly is good.   SLP Visit Diagnosis Dysphagia, oropharyngeal phase (R13.12) Attention and concentration deficit following -- Frontal lobe and executive function deficit following -- Impact on safety and function Moderate aspiration risk   CHL IP TREATMENT RECOMMENDATION 03/09/2019 Treatment Recommendations Therapy as outlined in treatment plan below   Prognosis 03/09/2019 Prognosis for Safe Diet Advancement Good Barriers to Reach Goals -- Barriers/Prognosis Comment -- CHL IP DIET RECOMMENDATION 03/09/2019 SLP Diet Recommendations Dysphagia 3 (Mech soft) solids;Nectar thick liquid Liquid Administration via Cup Medication Administration Whole meds with puree Compensations Minimize environmental distractions;Slow  rate;Small sips/bites Postural Changes Remain semi-upright after after feeds/meals (Comment);Seated upright at 90 degrees   CHL IP OTHER RECOMMENDATIONS 03/09/2019 Recommended Consults -- Oral Care Recommendations Oral care BID Other Recommendations Order thickener from pharmacy   CHL IP FOLLOW UP RECOMMENDATIONS 03/09/2019 Follow up Recommendations 24 hour supervision/assistance   CHL IP FREQUENCY AND DURATION 03/09/2019 Speech Therapy Frequency (ACUTE ONLY) min 2x/week Treatment Duration 2 weeks      CHL IP ORAL PHASE 03/09/2019 Oral Phase WFL Oral - Pudding Teaspoon -- Oral - Pudding Cup -- Oral - Honey Teaspoon -- Oral - Honey Cup -- Oral - Nectar Teaspoon -- Oral - Nectar Cup -- Oral - Nectar Straw -- Oral - Thin Teaspoon -- Oral - Thin Cup -- Oral - Thin Straw -- Oral - Puree -- Oral - Mech Soft -- Oral - Regular -- Oral - Multi-Consistency -- Oral - Pill -- Oral Phase - Comment --  CHL IP PHARYNGEAL PHASE 03/09/2019 Pharyngeal Phase Impaired Pharyngeal- Pudding Teaspoon -- Pharyngeal -- Pharyngeal- Pudding Cup -- Pharyngeal -- Pharyngeal- Honey Teaspoon -- Pharyngeal -- Pharyngeal- Honey Cup -- Pharyngeal -- Pharyngeal- Nectar Teaspoon -- Pharyngeal -- Pharyngeal- Nectar Cup Reduced airway/laryngeal closure;Penetration/Aspiration during swallow Pharyngeal Material enters airway, remains ABOVE vocal cords and not ejected out Pharyngeal- Nectar Straw -- Pharyngeal -- Pharyngeal- Thin Teaspoon -- Pharyngeal -- Pharyngeal- Thin Cup Delayed swallow initiation-pyriform sinuses;Penetration/Aspiration during swallow;Reduced airway/laryngeal closure Pharyngeal Material enters airway, passes BELOW cords without attempt by patient to eject out (silent aspiration);Material enters airway, CONTACTS cords and not ejected out Pharyngeal- Thin Straw Reduced airway/laryngeal closure;Penetration/Aspiration during swallow;Delayed swallow initiation-pyriform sinuses Pharyngeal Material enters airway, CONTACTS cords and not ejected out  Pharyngeal- Puree -- Pharyngeal -- Pharyngeal- Mechanical Soft -- Pharyngeal -- Pharyngeal- Regular -- Pharyngeal -- Pharyngeal- Multi-consistency -- Pharyngeal -- Pharyngeal- Pill -- Pharyngeal -- Pharyngeal Comment --  No flowsheet data found. Page, Melanee Spry 03/09/2019, 4:07 PM                 Subjective: Patient seen and examined the bedside this morning.  Hemodynamically stable for discharge.  Discharge Exam: Vitals:   03/12/19 0729 03/12/19 0737  BP:  (!) 152/66  Pulse:  85  Resp:    Temp:  98.1 F (36.7 C)  SpO2: 92% 92%   Vitals:   03/11/19 1909 03/11/19 2327 03/12/19 0729 03/12/19 0737  BP:  (!) 142/65  (!) 152/66  Pulse:  70  85  Resp:  16    Temp:  98.4 F (36.9 C)  98.1 F (36.7 C)  TempSrc:  Oral  Oral  SpO2: 92% 91% 92% 92%  Weight:      Height:        General: Pt is alert, awake, not in acute distress Cardiovascular: RRR, S1/S2 +, no  rubs, no gallops Respiratory: Bilateral decreased air entry, mild wheezes Abdominal: Soft, NT, ND, bowel sounds + Extremities: no edema, no cyanosis    The results of significant diagnostics from this hospitalization (including imaging, microbiology, ancillary and laboratory) are listed below for reference.     Microbiology: Recent Results (from the past 240 hour(s))  SARS Coronavirus 2 by RT PCR (hospital order, performed in [facility] hospital lab) Nasopharyngeal Nasopharyngeal Swab     Status: None   Collection Time: 03/07/19 12:12 PM   Specimen: Nasopharyngeal Swab  Result Value Ref Range Status   SARS Coronavirus 2 NEGATIVE NEGATIVE Final    Comment: (NOTE) If result is NEGATIVE SARS-CoV-2 target nucleic acids are NOT DETECTED. The SARS-CoV-2 RNA is generally detectable in upper and lower  respiratory specimens during the acute phase of infection. The lowest  concentration of SARS-CoV-2 viral copies this assay can detect is 250  copies / mL. A negative result does not preclude SARS-CoV-2 infection  and should  not be used as the sole basis for treatment or other  patient management decisions.  A negative result may occur with  improper specimen collection / handling, submission of specimen other  than nasopharyngeal swab, presence of viral mutation(s) within the  areas targeted by this assay, and inadequate number of viral copies  (<250 copies / mL). A negative result must be combined with clinical  observations, patient history, and epidemiological information. If result is POSITIVE SARS-CoV-2 target nucleic acids are DETECTED. The SARS-CoV-2 RNA is generally detectable in upper and lower  respiratory specimens dur ing the acute phase of infection.  Positive  results are indicative of active infection with SARS-CoV-2.  Clinical  correlation with patient history and other diagnostic information is  necessary to determine patient infection status.  Positive results do  not rule out bacterial infection or co-infection with other viruses. If result is PRESUMPTIVE POSTIVE SARS-CoV-2 nucleic acids MAY BE PRESENT.   A presumptive positive result was obtained on the submitted specimen  and confirmed on repeat testing.  While 2019 novel coronavirus  (SARS-CoV-2) nucleic acids may be present in the submitted sample  additional confirmatory testing may be necessary for epidemiological  and / or clinical management purposes  to differentiate between  SARS-CoV-2 and other Sarbecovirus currently known to infect humans.  If clinically indicated additional testing with an alternate test  methodology [phone] is advised. The SARS-CoV-2 RNA is generally  detectable in upper and lower respiratory sp ecimens during the acute  phase of infection. The expected result is Negative. Fact Sheet for Patients:  BoilerBrush.com.cyhttps://www.fda.gov/media/136312/download Fact Sheet for Healthcare Providers: https://pope.com/https://www.fda.gov/media/136313/download This test is not yet approved or cleared by the [place] FDA and has been  authorized for detection and/or diagnosis of SARS-CoV-2 by FDA under an Emergency Use Authorization (EUA).  This EUA will remain in effect (meaning this test can be used) for the duration of the COVID-19 declaration under Section 564(b)(1) of the Act, 21 U.S.C. section 360bbb-3(b)(1), unless the authorization is terminated or revoked sooner. Performed at [facility], [address] [place], [place] [id number]   Blood culture (routine x 2)     Status: None   Collection Time: 03/07/19 12:53 PM   Specimen: BLOOD RIGHT WRIST  Result Value Ref Range Status   Specimen Description BLOOD RIGHT WRIST  Final   Special Requests   Final    BOTTLES DRAWN AEROBIC AND ANAEROBIC Blood Culture adequate volume   Culture   Final  NO GROWTH 5 DAYS Performed at [facility], [address] [place], [place] [address]    Report Status 03/12/2019 FINAL  Final  Blood culture (routine x 2)     Status: None   Collection Time: 03/07/19 12:53 PM   Specimen: BLOOD LEFT WRIST  Result Value Ref Range Status   Specimen Description BLOOD LEFT WRIST  Final   Special Requests   Final    BOTTLES DRAWN AEROBIC AND ANAEROBIC Blood Culture results may not be optimal due to an inadequate volume of blood received in culture bottles   Culture   Final    NO GROWTH 5 DAYS Performed at [facility], [address] [place], [place] [address]    Report Status 03/12/2019 FINAL  Final  Urine culture     Status: Abnormal   Collection Time: 03/07/19  7:02 PM   Specimen: Urine, Random  Result Value Ref Range Status   Specimen Description URINE, RANDOM  Final   Special Requests NONE  Final   Culture (A)  Final    <10,000 COLONIES/mL INSIGNIFICANT GROWTH Performed at [facility], [address], [place], [place] [address]    Report Status 03/08/2019 FINAL  Final  MRSA PCR Screening     Status: None   Collection Time: 03/07/19  8:34 PM   Specimen: Nasal Mucosa; Nasopharyngeal  Result Value Ref  Range Status   MRSA by PCR NEGATIVE NEGATIVE Final    Comment:        The GeneXpert MRSA Assay (FDA approved for NASAL specimens only), is one component of a comprehensive MRSA colonization surveillance program. It is not intended to diagnose MRSA infection nor to guide or monitor treatment for MRSA infections. Performed at [facility], [address], [place], [place] [address]      Labs: BNP (last 3 results) No results for input(s): BNP in the last 8760 hours. Basic Metabolic Panel: Recent Labs  Lab 03/08/19 0346 03/09/19 0708 03/10/19 0333 03/11/19 0453 03/12/19 0443  NA 136 136 139 138 139  K 4.8 4.4 4.0 3.5 3.5  CL 112* 111 105 99 97*  CO2 18* 19* 23 28 30   GLUCOSE 152* 154* 215* 210* 205*  BUN 19 12 16 18 15   CREATININE 0.81 0.86 0.86 0.95 0.75  CALCIUM 7.6* 8.1* 8.4* 8.6* 8.7*   Liver Function Tests: Recent Labs  Lab 03/07/19 1123  AST 23  ALT 11  ALKPHOS 57  BILITOT 0.4  PROT 5.3*  ALBUMIN 2.7*   Recent Labs  Lab 03/07/19 1203  LIPASE 71*   No results for input(s): AMMONIA in the last 168 hours. CBC: Recent Labs  Lab 03/07/19 1123 03/08/19 0346 03/09/19 0708 03/10/19 0333 03/12/19 0443  WBC 17.8* 14.7* 13.2* 10.0 9.6  NEUTROABS 16.2*  --  11.9* 9.0* 7.8*  HGB 8.8* 8.0* 7.5* 7.7* 8.8*  HCT 28.7* 26.3* 24.8* 25.0* 28.8*  MCV 86.2 86.5 85.8 85.0 83.7  PLT 230 206 227 269 270   Cardiac Enzymes: No results for input(s): CKTOTAL, CKMB, CKMBINDEX, TROPONINI in the last 168 hours. BNP: Invalid input(s): POCBNP CBG: Recent Labs  Lab 03/11/19 0732 03/11/19 1146 03/11/19 1648 03/11/19 2122 03/12/19 0738  GLUCAP 175* 288* 189* 144* 166*   D-Dimer No results for input(s): DDIMER in the last 72 hours. Hgb A1c No results for input(s): HGBA1C in the last 72 hours. Lipid Profile No results for input(s): CHOL, HDL, LDLCALC, TRIG, CHOLHDL, LDLDIRECT in the last 72  hours. Thyroid function studies No results for input(s): TSH,  T4TOTAL, T3FREE, THYROIDAB in the last 72 hours.  Invalid input(s): FREET3 Anemia work up Recent Labs    03/09/19 1112  FERRITIN 62  TIBC 329  IRON 20*   Urinalysis    Component Value Date/Time   COLORURINE YELLOW 03/07/2019 1902   APPEARANCEUR HAZY (A) 03/07/2019 1902   LABSPEC 1.016 03/07/2019 1902   PHURINE 5.0 03/07/2019 1902   GLUCOSEU NEGATIVE 03/07/2019 1902   HGBUR NEGATIVE 03/07/2019 1902   BILIRUBINUR NEGATIVE 03/07/2019 1902   KETONESUR NEGATIVE 03/07/2019 1902   PROTEINUR NEGATIVE 03/07/2019 1902   NITRITE POSITIVE (A) 03/07/2019 1902   LEUKOCYTESUR SMALL (A) 03/07/2019 1902   Sepsis Labs Invalid input(s): PROCALCITONIN,  WBC,  LACTICIDVEN Microbiology Recent Results (from the past 240 hour(s))  SARS Coronavirus 2 by RT PCR (hospital order, performed in [facility] hospital lab) Nasopharyngeal Nasopharyngeal Swab     Status: None   Collection Time: 03/07/19 12:12 PM   Specimen: Nasopharyngeal Swab  Result Value Ref Range Status   SARS Coronavirus 2 NEGATIVE NEGATIVE Final    Comment: (NOTE) If result is NEGATIVE SARS-CoV-2 target nucleic acids are NOT DETECTED. The SARS-CoV-2 RNA is generally detectable in upper and lower  respiratory specimens during the acute phase of infection. The lowest  concentration of SARS-CoV-2 viral copies this assay can detect is 250  copies / mL. A negative result does not preclude SARS-CoV-2 infection  and should not be used as the sole basis for treatment or other  patient management decisions.  A negative result may occur with  improper specimen collection / handling, submission of specimen other  than nasopharyngeal swab, presence of viral mutation(s) within the  areas targeted by this assay, and inadequate number of viral copies  (<250 copies / mL). A negative result must be combined with clinical  observations, patient history, and epidemiological information. If result is POSITIVE SARS-CoV-2 target nucleic acids are  DETECTED. The SARS-CoV-2 RNA is generally detectable in upper and lower  respiratory specimens dur ing the acute phase of infection.  Positive  results are indicative of active infection with SARS-CoV-2.  Clinical  correlation with patient history and other diagnostic information is  necessary to determine patient infection status.  Positive results do  not rule out bacterial infection or co-infection with other viruses. If result is PRESUMPTIVE POSTIVE SARS-CoV-2 nucleic acids MAY BE PRESENT.   A presumptive positive result was obtained on the submitted specimen  and confirmed on repeat testing.  While 2019 novel coronavirus  (SARS-CoV-2) nucleic acids may be present in the submitted sample  additional confirmatory testing may be necessary for epidemiological  and / or clinical management purposes  to differentiate between  SARS-CoV-2 and other Sarbecovirus currently known to infect humans.  If clinically indicated additional testing with an alternate test  methodology [phone]) is advised. The SARS-CoV-2 RNA is generally  detectable in upper and lower respiratory sp ecimens during the acute  phase of infection. The expected result is Negative. Fact Sheet for Patients:  StrictlyIdeas.no Fact Sheet for Healthcare Providers: BankingDealers.co.za This test is not yet approved or cleared by the [place] FDA and has been authorized for detection and/or diagnosis of SARS-CoV-2 by FDA under an Emergency Use Authorization (EUA).  This EUA will remain in effect (meaning this test can be used) for the duration of the COVID-19 declaration under Section 564(b)(1) of the Act, 21 U.S.C. section 360bbb-3(b)(1), unless the authorization is terminated or revoked sooner. Performed  at [facility], [address] [place], [place] [address]   Blood culture (routine x 2)     Status: None   Collection Time: 03/07/19 12:53 PM   Specimen: BLOOD  RIGHT WRIST  Result Value Ref Range Status   Specimen Description BLOOD RIGHT WRIST  Final   Special Requests   Final    BOTTLES DRAWN AEROBIC AND ANAEROBIC Blood Culture adequate volume   Culture   Final    NO GROWTH 5 DAYS Performed at [facility], [address] [place], [place] [address]    Report Status 03/12/2019 FINAL  Final  Blood culture (routine x 2)     Status: None   Collection Time: 03/07/19 12:53 PM   Specimen: BLOOD LEFT WRIST  Result Value Ref Range Status   Specimen Description BLOOD LEFT WRIST  Final   Special Requests   Final    BOTTLES DRAWN AEROBIC AND ANAEROBIC Blood Culture results may not be optimal due to an inadequate volume of blood received in culture bottles   Culture   Final    NO GROWTH 5 DAYS Performed at [facility], [address] [place], [place] [address]    Report Status 03/12/2019 FINAL  Final  Urine culture     Status: Abnormal   Collection Time: 03/07/19  7:02 PM   Specimen: Urine, Random  Result Value Ref Range Status   Specimen Description URINE, RANDOM  Final   Special Requests NONE  Final   Culture (A)  Final    <10,000 COLONIES/mL INSIGNIFICANT GROWTH Performed at [facility], [address] [place], [place] [address]    Report Status 03/08/2019 FINAL  Final  MRSA PCR Screening     Status: None   Collection Time: 03/07/19  8:34 PM   Specimen: Nasal Mucosa; Nasopharyngeal  Result Value Ref Range Status   MRSA by PCR NEGATIVE NEGATIVE Final    Comment:        The GeneXpert MRSA Assay (FDA approved for NASAL specimens only), is one component of a comprehensive MRSA colonization surveillance program. It is not intended to diagnose MRSA infection nor to guide or monitor treatment for MRSA infections. Performed at [facility], [address] [place], [place] [address]     Please note: You were cared for by a hospitalist during your hospital stay. Once you are discharged, your primary care  physician will handle any further medical issues. Please note that NO REFILLS for any discharge medications will be authorized once you are discharged, as it is imperative that you return to your primary care physician (or establish a relationship with a primary care physician if you do not have one) for your post hospital discharge needs so that they can reassess your need for medications and monitor your lab values.    Time coordinating discharge: 40 minutes  SIGNED:   Burnadette PopAmrit Oran Dillenburg, MD  [facility] 03/12/2019, 10:51 AM Pager [phone]  If 7PM-7AM, please contact night-coverage [email or web address] Password [email or web address]

## 2019-03-12 NOTE — Progress Notes (Signed)
Modified Barium Swallow Progress Note  Patient Details  Name: Karla Clark MRN: [id number] Date of Birth: Mar 31, 1946  Today's Date: 03/12/2019  Modified Barium Swallow completed.  Full report located under Chart Review in the Imaging Section.  Brief recommendations include the following:  Clinical Impression  Pt presents with minimal-mild oropharyngeal dysphagia with resultant laryngeal penetration of thin liquid on today's examination.  Pt with improvement from previous MBS on 03/09/19.  She consumed trials of thin liquid, nectar-thick liquid, puree, and regular solids.  She exhibited transient laryngeal penetration with thin liquid via cup sip and with thin liquid via straw sip during consecutive swallows.  Laryngeal penetration was eliminated with thin liquid via straw sip when pt was cued to take single sips.  No aspiration was observed with any consistency.  Oral phase was remarkable for prolonged mastication and AP transport of regular solids.  In addition, pt had reduced lingual control resulting in premature spillage to the pharynx and mildly reduced lingual strength resulting in trace oral residue.  Pharyngeal phase was remarkable for reduced hyolaryngeal excursion and reduced BOT retraction resulting in trace vallecular residue.  Pharyngoesophageal phase was unremarkable.  Recommend Dysphagia 3 (soft) solids and thin liquid with the following precautions: 1) Single sips 2) Small bites/sips 3) Slow rate of intake 4) Sit upright 90 degrees.     Swallow Evaluation Recommendations       SLP Diet Recommendations: Dysphagia 3 (Mech soft) solids;Thin liquid   Liquid Administration via: Cup;Straw   Medication Administration: Whole meds with puree   Supervision: Full supervision/cueing for compensatory strategies   Compensations: Minimize environmental distractions;Slow rate;Small sips/bites   Postural Changes: Seated upright at 90 degrees   Oral Care Recommendations: Oral care  BID      Colin Mulders M.S., CCC-SLP Acute Rehabilitation Services Office: [phone]  [name] 03/12/2019,3:01 PM

## 2019-03-12 NOTE — TOC Transition Note (Signed)
Transition of Care [facility]) - CM/SW Discharge Note   Patient Details  Name: Karla Clark MRN: [id number] Date of Birth: 05-26-1945  Transition of Care [facility]) CM/SW Contact:  Karla Labrador, RN Phone Number: 03/12/2019, 11:12 AM   Clinical Narrative:   Pt to discharge home today in care of daughter.  Daughter will provide transportation home via private vehicle.  CM contacted pt via phone - pt deferred discharge planning to daughter Karla Clark.  CM contacted Karla Clark and informed pt has been deemed stable for discharge home, recommendation continues to be [place] in addition to home oxygen.  CM provided medicare;gov choice - daughter chose [place] contacted and referral accepted.  CM faxed requested documentation to [facility] agency and verified receipt.  Agency aware that pt will discharge home today.  MC offered choice for home oxygen - daughter chose [employer] - agency contacted and referral accepted    Final next level of care: Home w Home Health Services Barriers to Discharge: Barriers Resolved   Patient Goals and CMS Choice Patient states their goals for this hospitalization and ongoing recovery are:: Pt deferred questions/interventions to daugter - pt nor daughter specified goals during hosptialization nor post   Choice offered to / list presented to : Adult Children  Discharge Placement                       Discharge Plan and Services                DME Arranged: Lightweight manual wheelchair with seat cushion, Oxygen         HH Arranged: RN, PT, OT, Nurse's Aide, Speech Therapy HH Agency: [place] Date [place]: 03/12/19 Time [place]: 1112 Representative spoke with at [place] Determinants of Health ([place]) Interventions     Readmission Risk Interventions No flowsheet data found.

## 2019-03-12 NOTE — Progress Notes (Signed)
SATURATION QUALIFICATIONS: (This note is used to comply with regulatory documentation for home oxygen)  Patient Saturations on Room Air at Rest = 84%  Patient Saturations on Room Air while Ambulating = 78%  Patient Saturations on 2 Liters of oxygen while Ambulating = 87% --> had to return to bed as patient felt she was going to fall.   Please briefly explain why patient needs home oxygen: H/O COPD, recurrent aspiration PNA

## 2019-03-12 NOTE — Progress Notes (Signed)
Physical Therapy Treatment Patient Details Name: Karla Clark MRN: [id number] DOB: 25-Aug-1945 Today's Date: 03/12/2019    History of Present Illness 73 y.o. female with a past medical history significant for diabetes, COPD, hypertension, hypothyroidism, anxiety, osteoarthritis, and rheumatoid arthritis who presents with chills, cough, shortness of breath, and hypoxia.  Reports of inreased cough and SoB for last year. Pt admitted 02/04/19 for treatment of sepsis secondary to PNA, respiratory failure withhypoxia and UTI.    PT Comments    Pt up in chair on entry agreeable to work with therapies. Pt limited in safe mobility by oxygen desaturation (see General Comments), pain in bilateral knees with weightbearing and generalized weakness. Pt is min guard for transfers, hands on min guard for ambulation of 2x25 feet with RW. Pt able to maintain SaO2 >90%O2 with ambulation on 2L. Pt very determine to get back to PLOF, and tried to ambulate back to room on RA however SaO2 dropped to 77%O2. Pt will definitely need supplemental O2 for d/c home. Pt hopeful for d/c this afternoon. PT will continue to follow acutely.   Follow Up Recommendations  Home health PT;Supervision/Assistance - 24 hour     Equipment Recommendations  None recommended by PT       Precautions / Restrictions Precautions Precautions: Fall Restrictions Weight Bearing Restrictions: No    Mobility  Bed Mobility Overal bed mobility: Needs Assistance Bed Mobility: Sit to Supine       Sit to supine: Mod assist   General bed mobility comments: Mod A to bring BLEs over EOB  Transfers Overall transfer level: Needs assistance Equipment used: Rolling walker (2 wheeled) Transfers: Sit to/from Stand Sit to Stand: Min guard         General transfer comment: Min Guard A for safety.   Ambulation/Gait Ambulation/Gait assistance: Min guard Gait Distance (Feet): 50 Feet(1x seated rest break) Assistive device: Rolling  walker (2 wheeled) Gait Pattern/deviations: Step-through pattern;Shuffle;Decreased step length - right;Decreased step length - left     General Gait Details: hands on min guard for safety, pt with slowed, mildly unsteady gait with increasing knee flexion with distance.        Balance Overall balance assessment: Needs assistance Sitting-balance support: Feet unsupported;No upper extremity supported;Feet supported;Single extremity supported Sitting balance-Leahy Scale: Fair     Standing balance support: During functional activity;Bilateral upper extremity supported Standing balance-Leahy Scale: Poor Standing balance comment: requires UE support for steadying in standing                            Cognition Arousal/Alertness: Awake/alert Behavior During Therapy: Flat affect Overall Cognitive Status: History of cognitive impairments - at baseline                                 General Comments: Pt requiring increased cues and       Exercises      General Comments General comments (skin integrity, edema, etc.): SpO2 dropping to 80% on 2L while performing oral care; elevated to 3L and pt spO2 returning to 90s. During functional mobility in hallway, pt wanting to attempt mobility on RA. Pt maintaining SpO2 >91% on RA until sitting on EOB and pt SpO2 dropping to 77% on RA. Returned pt to 3L O2 and cued for purse lip breathing. SpO2 retured to 90s. Left pt on 2L at end of session      Pertinent  Vitals/Pain Pain Assessment: Faces Faces Pain Scale: Hurts little more Pain Location: bilateral knees with movement and weightbearing Pain Descriptors / Indicators: Grimacing;Guarding Pain Intervention(s): Monitored during session;Repositioned           PT Goals (current goals can now be found in the care plan section) Acute Rehab PT Goals Patient Stated Goal: feel better PT Goal Formulation: With patient Time For Goal Achievement: 03/22/19 Potential to  Achieve Goals: Good Progress towards PT goals: Progressing toward goals    Frequency    Min 3X/week      PT Plan Current plan remains appropriate    Co-evaluation PT/OT/SLP Co-Evaluation/Treatment: Yes Reason for Co-Treatment: For patient/therapist safety PT goals addressed during session: Mobility/safety with mobility OT goals addressed during session: ADL's and self-care      AM-PAC PT "6 Clicks" Mobility   Outcome Measure  Help needed turning from your back to your side while in a flat bed without using bedrails?: A Little Help needed moving from lying on your back to sitting on the side of a flat bed without using bedrails?: A Little Help needed moving to and from a bed to a chair (including a wheelchair)?: A Little Help needed standing up from a chair using your arms (e.g., wheelchair or bedside chair)?: A Little Help needed to walk in hospital room?: A Little Help needed climbing 3-5 steps with a railing? : A Lot 6 Click Score: 17    End of Session Equipment Utilized During Treatment: Gait belt;Oxygen Activity Tolerance: Patient tolerated treatment well Patient left: in bed;with call bell/phone within reach;Other (comment)(NT notified pt on bed pan ) Nurse Communication: Mobility status PT Visit Diagnosis: Unsteadiness on feet (R26.81);Other abnormalities of gait and mobility (R26.89);Muscle weakness (generalized) (M62.81);Difficulty in walking, not elsewhere classified (R26.2);Pain Pain - Right/Left: (bilateral) Pain - part of body: Knee     Time: 0938-1829 PT Time Calculation (min) (ACUTE ONLY): 23 min  Charges:  $Gait Training: 8-22 mins                     Shandie Bertz B. Beverely Risen PT, DPT Acute Rehabilitation Services Pager [phone] Office [phone]    Elon Alas Fleet 03/12/2019, 12:38 PM

## 2019-03-13 DIAGNOSIS — Z7984 Long term (current) use of oral hypoglycemic drugs: Secondary | ICD-10-CM | POA: Diagnosis not present

## 2019-03-13 DIAGNOSIS — N390 Urinary tract infection, site not specified: Secondary | ICD-10-CM | POA: Diagnosis not present

## 2019-03-13 DIAGNOSIS — D509 Iron deficiency anemia, unspecified: Secondary | ICD-10-CM | POA: Diagnosis not present

## 2019-03-13 DIAGNOSIS — M1991 Primary osteoarthritis, unspecified site: Secondary | ICD-10-CM | POA: Diagnosis not present

## 2019-03-13 DIAGNOSIS — Z7952 Long term (current) use of systemic steroids: Secondary | ICD-10-CM | POA: Diagnosis not present

## 2019-03-13 DIAGNOSIS — Z9181 History of falling: Secondary | ICD-10-CM | POA: Diagnosis not present

## 2019-03-13 DIAGNOSIS — Z87891 Personal history of nicotine dependence: Secondary | ICD-10-CM | POA: Diagnosis not present

## 2019-03-13 DIAGNOSIS — I110 Hypertensive heart disease with heart failure: Secondary | ICD-10-CM | POA: Diagnosis not present

## 2019-03-13 DIAGNOSIS — Z9981 Dependence on supplemental oxygen: Secondary | ICD-10-CM | POA: Diagnosis not present

## 2019-03-13 DIAGNOSIS — Z7951 Long term (current) use of inhaled steroids: Secondary | ICD-10-CM | POA: Diagnosis not present

## 2019-03-13 DIAGNOSIS — J189 Pneumonia, unspecified organism: Secondary | ICD-10-CM | POA: Diagnosis not present

## 2019-03-13 DIAGNOSIS — Z87440 Personal history of urinary (tract) infections: Secondary | ICD-10-CM | POA: Diagnosis not present

## 2019-03-13 DIAGNOSIS — J440 Chronic obstructive pulmonary disease with acute lower respiratory infection: Secondary | ICD-10-CM | POA: Diagnosis not present

## 2019-03-13 DIAGNOSIS — J441 Chronic obstructive pulmonary disease with (acute) exacerbation: Secondary | ICD-10-CM | POA: Diagnosis not present

## 2019-03-13 DIAGNOSIS — R2689 Other abnormalities of gait and mobility: Secondary | ICD-10-CM | POA: Diagnosis not present

## 2019-03-13 DIAGNOSIS — M069 Rheumatoid arthritis, unspecified: Secondary | ICD-10-CM | POA: Diagnosis not present

## 2019-03-13 DIAGNOSIS — E785 Hyperlipidemia, unspecified: Secondary | ICD-10-CM | POA: Diagnosis not present

## 2019-03-13 DIAGNOSIS — E039 Hypothyroidism, unspecified: Secondary | ICD-10-CM | POA: Diagnosis not present

## 2019-03-13 DIAGNOSIS — E119 Type 2 diabetes mellitus without complications: Secondary | ICD-10-CM | POA: Diagnosis not present

## 2019-03-13 DIAGNOSIS — Z792 Long term (current) use of antibiotics: Secondary | ICD-10-CM | POA: Diagnosis not present

## 2019-03-13 DIAGNOSIS — I5032 Chronic diastolic (congestive) heart failure: Secondary | ICD-10-CM | POA: Diagnosis not present

## 2019-03-13 DIAGNOSIS — R2681 Unsteadiness on feet: Secondary | ICD-10-CM | POA: Diagnosis not present

## 2019-03-15 DIAGNOSIS — E785 Hyperlipidemia, unspecified: Secondary | ICD-10-CM | POA: Diagnosis not present

## 2019-03-15 DIAGNOSIS — E039 Hypothyroidism, unspecified: Secondary | ICD-10-CM | POA: Diagnosis not present

## 2019-03-15 DIAGNOSIS — M069 Rheumatoid arthritis, unspecified: Secondary | ICD-10-CM | POA: Diagnosis not present

## 2019-03-15 DIAGNOSIS — J189 Pneumonia, unspecified organism: Secondary | ICD-10-CM | POA: Diagnosis not present

## 2019-03-15 DIAGNOSIS — Z7952 Long term (current) use of systemic steroids: Secondary | ICD-10-CM | POA: Diagnosis not present

## 2019-03-15 DIAGNOSIS — Z87440 Personal history of urinary (tract) infections: Secondary | ICD-10-CM | POA: Diagnosis not present

## 2019-03-15 DIAGNOSIS — Z7951 Long term (current) use of inhaled steroids: Secondary | ICD-10-CM | POA: Diagnosis not present

## 2019-03-15 DIAGNOSIS — M1991 Primary osteoarthritis, unspecified site: Secondary | ICD-10-CM | POA: Diagnosis not present

## 2019-03-15 DIAGNOSIS — N390 Urinary tract infection, site not specified: Secondary | ICD-10-CM | POA: Diagnosis not present

## 2019-03-15 DIAGNOSIS — Z87891 Personal history of nicotine dependence: Secondary | ICD-10-CM | POA: Diagnosis not present

## 2019-03-15 DIAGNOSIS — Z9981 Dependence on supplemental oxygen: Secondary | ICD-10-CM | POA: Diagnosis not present

## 2019-03-15 DIAGNOSIS — J441 Chronic obstructive pulmonary disease with (acute) exacerbation: Secondary | ICD-10-CM | POA: Diagnosis not present

## 2019-03-15 DIAGNOSIS — E119 Type 2 diabetes mellitus without complications: Secondary | ICD-10-CM | POA: Diagnosis not present

## 2019-03-15 DIAGNOSIS — Z9181 History of falling: Secondary | ICD-10-CM | POA: Diagnosis not present

## 2019-03-15 DIAGNOSIS — D509 Iron deficiency anemia, unspecified: Secondary | ICD-10-CM | POA: Diagnosis not present

## 2019-03-15 DIAGNOSIS — I5032 Chronic diastolic (congestive) heart failure: Secondary | ICD-10-CM | POA: Diagnosis not present

## 2019-03-15 DIAGNOSIS — Z792 Long term (current) use of antibiotics: Secondary | ICD-10-CM | POA: Diagnosis not present

## 2019-03-15 DIAGNOSIS — R2689 Other abnormalities of gait and mobility: Secondary | ICD-10-CM | POA: Diagnosis not present

## 2019-03-15 DIAGNOSIS — I110 Hypertensive heart disease with heart failure: Secondary | ICD-10-CM | POA: Diagnosis not present

## 2019-03-15 DIAGNOSIS — R2681 Unsteadiness on feet: Secondary | ICD-10-CM | POA: Diagnosis not present

## 2019-03-15 DIAGNOSIS — Z7984 Long term (current) use of oral hypoglycemic drugs: Secondary | ICD-10-CM | POA: Diagnosis not present

## 2019-03-15 DIAGNOSIS — J440 Chronic obstructive pulmonary disease with acute lower respiratory infection: Secondary | ICD-10-CM | POA: Diagnosis not present

## 2019-03-19 DIAGNOSIS — Z9181 History of falling: Secondary | ICD-10-CM | POA: Diagnosis not present

## 2019-03-19 DIAGNOSIS — J441 Chronic obstructive pulmonary disease with (acute) exacerbation: Secondary | ICD-10-CM | POA: Diagnosis not present

## 2019-03-19 DIAGNOSIS — N390 Urinary tract infection, site not specified: Secondary | ICD-10-CM | POA: Diagnosis not present

## 2019-03-19 DIAGNOSIS — J189 Pneumonia, unspecified organism: Secondary | ICD-10-CM | POA: Diagnosis not present

## 2019-03-19 DIAGNOSIS — Z7952 Long term (current) use of systemic steroids: Secondary | ICD-10-CM | POA: Diagnosis not present

## 2019-03-19 DIAGNOSIS — E785 Hyperlipidemia, unspecified: Secondary | ICD-10-CM | POA: Diagnosis not present

## 2019-03-19 DIAGNOSIS — I110 Hypertensive heart disease with heart failure: Secondary | ICD-10-CM | POA: Diagnosis not present

## 2019-03-19 DIAGNOSIS — R2689 Other abnormalities of gait and mobility: Secondary | ICD-10-CM | POA: Diagnosis not present

## 2019-03-19 DIAGNOSIS — D509 Iron deficiency anemia, unspecified: Secondary | ICD-10-CM | POA: Diagnosis not present

## 2019-03-19 DIAGNOSIS — M1991 Primary osteoarthritis, unspecified site: Secondary | ICD-10-CM | POA: Diagnosis not present

## 2019-03-19 DIAGNOSIS — Z7951 Long term (current) use of inhaled steroids: Secondary | ICD-10-CM | POA: Diagnosis not present

## 2019-03-19 DIAGNOSIS — Z87440 Personal history of urinary (tract) infections: Secondary | ICD-10-CM | POA: Diagnosis not present

## 2019-03-19 DIAGNOSIS — Z792 Long term (current) use of antibiotics: Secondary | ICD-10-CM | POA: Diagnosis not present

## 2019-03-19 DIAGNOSIS — M069 Rheumatoid arthritis, unspecified: Secondary | ICD-10-CM | POA: Diagnosis not present

## 2019-03-19 DIAGNOSIS — Z9981 Dependence on supplemental oxygen: Secondary | ICD-10-CM | POA: Diagnosis not present

## 2019-03-19 DIAGNOSIS — Z87891 Personal history of nicotine dependence: Secondary | ICD-10-CM | POA: Diagnosis not present

## 2019-03-19 DIAGNOSIS — E119 Type 2 diabetes mellitus without complications: Secondary | ICD-10-CM | POA: Diagnosis not present

## 2019-03-19 DIAGNOSIS — I5032 Chronic diastolic (congestive) heart failure: Secondary | ICD-10-CM | POA: Diagnosis not present

## 2019-03-19 DIAGNOSIS — J440 Chronic obstructive pulmonary disease with acute lower respiratory infection: Secondary | ICD-10-CM | POA: Diagnosis not present

## 2019-03-19 DIAGNOSIS — Z7984 Long term (current) use of oral hypoglycemic drugs: Secondary | ICD-10-CM | POA: Diagnosis not present

## 2019-03-19 DIAGNOSIS — E039 Hypothyroidism, unspecified: Secondary | ICD-10-CM | POA: Diagnosis not present

## 2019-03-19 DIAGNOSIS — R2681 Unsteadiness on feet: Secondary | ICD-10-CM | POA: Diagnosis not present

## 2019-03-20 DIAGNOSIS — J440 Chronic obstructive pulmonary disease with acute lower respiratory infection: Secondary | ICD-10-CM | POA: Diagnosis not present

## 2019-03-20 DIAGNOSIS — Z7952 Long term (current) use of systemic steroids: Secondary | ICD-10-CM | POA: Diagnosis not present

## 2019-03-20 DIAGNOSIS — E785 Hyperlipidemia, unspecified: Secondary | ICD-10-CM | POA: Diagnosis not present

## 2019-03-20 DIAGNOSIS — J441 Chronic obstructive pulmonary disease with (acute) exacerbation: Secondary | ICD-10-CM | POA: Diagnosis not present

## 2019-03-20 DIAGNOSIS — J189 Pneumonia, unspecified organism: Secondary | ICD-10-CM | POA: Diagnosis not present

## 2019-03-20 DIAGNOSIS — Z87891 Personal history of nicotine dependence: Secondary | ICD-10-CM | POA: Diagnosis not present

## 2019-03-20 DIAGNOSIS — R2689 Other abnormalities of gait and mobility: Secondary | ICD-10-CM | POA: Diagnosis not present

## 2019-03-20 DIAGNOSIS — Z9981 Dependence on supplemental oxygen: Secondary | ICD-10-CM | POA: Diagnosis not present

## 2019-03-20 DIAGNOSIS — D509 Iron deficiency anemia, unspecified: Secondary | ICD-10-CM | POA: Diagnosis not present

## 2019-03-20 DIAGNOSIS — E119 Type 2 diabetes mellitus without complications: Secondary | ICD-10-CM | POA: Diagnosis not present

## 2019-03-20 DIAGNOSIS — Z87440 Personal history of urinary (tract) infections: Secondary | ICD-10-CM | POA: Diagnosis not present

## 2019-03-20 DIAGNOSIS — N390 Urinary tract infection, site not specified: Secondary | ICD-10-CM | POA: Diagnosis not present

## 2019-03-20 DIAGNOSIS — Z7984 Long term (current) use of oral hypoglycemic drugs: Secondary | ICD-10-CM | POA: Diagnosis not present

## 2019-03-20 DIAGNOSIS — M069 Rheumatoid arthritis, unspecified: Secondary | ICD-10-CM | POA: Diagnosis not present

## 2019-03-20 DIAGNOSIS — Z792 Long term (current) use of antibiotics: Secondary | ICD-10-CM | POA: Diagnosis not present

## 2019-03-20 DIAGNOSIS — I5032 Chronic diastolic (congestive) heart failure: Secondary | ICD-10-CM | POA: Diagnosis not present

## 2019-03-20 DIAGNOSIS — Z7951 Long term (current) use of inhaled steroids: Secondary | ICD-10-CM | POA: Diagnosis not present

## 2019-03-20 DIAGNOSIS — I110 Hypertensive heart disease with heart failure: Secondary | ICD-10-CM | POA: Diagnosis not present

## 2019-03-20 DIAGNOSIS — M1991 Primary osteoarthritis, unspecified site: Secondary | ICD-10-CM | POA: Diagnosis not present

## 2019-03-20 DIAGNOSIS — E039 Hypothyroidism, unspecified: Secondary | ICD-10-CM | POA: Diagnosis not present

## 2019-03-20 DIAGNOSIS — Z9181 History of falling: Secondary | ICD-10-CM | POA: Diagnosis not present

## 2019-03-20 DIAGNOSIS — R2681 Unsteadiness on feet: Secondary | ICD-10-CM | POA: Diagnosis not present

## 2019-03-22 DIAGNOSIS — R2681 Unsteadiness on feet: Secondary | ICD-10-CM | POA: Diagnosis not present

## 2019-03-22 DIAGNOSIS — R2689 Other abnormalities of gait and mobility: Secondary | ICD-10-CM | POA: Diagnosis not present

## 2019-03-22 DIAGNOSIS — Z87891 Personal history of nicotine dependence: Secondary | ICD-10-CM | POA: Diagnosis not present

## 2019-03-22 DIAGNOSIS — J440 Chronic obstructive pulmonary disease with acute lower respiratory infection: Secondary | ICD-10-CM | POA: Diagnosis not present

## 2019-03-22 DIAGNOSIS — J189 Pneumonia, unspecified organism: Secondary | ICD-10-CM | POA: Diagnosis not present

## 2019-03-22 DIAGNOSIS — Z7984 Long term (current) use of oral hypoglycemic drugs: Secondary | ICD-10-CM | POA: Diagnosis not present

## 2019-03-22 DIAGNOSIS — N390 Urinary tract infection, site not specified: Secondary | ICD-10-CM | POA: Diagnosis not present

## 2019-03-22 DIAGNOSIS — I5032 Chronic diastolic (congestive) heart failure: Secondary | ICD-10-CM | POA: Diagnosis not present

## 2019-03-22 DIAGNOSIS — E119 Type 2 diabetes mellitus without complications: Secondary | ICD-10-CM | POA: Diagnosis not present

## 2019-03-22 DIAGNOSIS — Z9981 Dependence on supplemental oxygen: Secondary | ICD-10-CM | POA: Diagnosis not present

## 2019-03-22 DIAGNOSIS — Z7951 Long term (current) use of inhaled steroids: Secondary | ICD-10-CM | POA: Diagnosis not present

## 2019-03-22 DIAGNOSIS — M1991 Primary osteoarthritis, unspecified site: Secondary | ICD-10-CM | POA: Diagnosis not present

## 2019-03-22 DIAGNOSIS — Z792 Long term (current) use of antibiotics: Secondary | ICD-10-CM | POA: Diagnosis not present

## 2019-03-22 DIAGNOSIS — E039 Hypothyroidism, unspecified: Secondary | ICD-10-CM | POA: Diagnosis not present

## 2019-03-22 DIAGNOSIS — I110 Hypertensive heart disease with heart failure: Secondary | ICD-10-CM | POA: Diagnosis not present

## 2019-03-22 DIAGNOSIS — E785 Hyperlipidemia, unspecified: Secondary | ICD-10-CM | POA: Diagnosis not present

## 2019-03-22 DIAGNOSIS — J441 Chronic obstructive pulmonary disease with (acute) exacerbation: Secondary | ICD-10-CM | POA: Diagnosis not present

## 2019-03-22 DIAGNOSIS — M069 Rheumatoid arthritis, unspecified: Secondary | ICD-10-CM | POA: Diagnosis not present

## 2019-03-22 DIAGNOSIS — D509 Iron deficiency anemia, unspecified: Secondary | ICD-10-CM | POA: Diagnosis not present

## 2019-03-22 DIAGNOSIS — Z9181 History of falling: Secondary | ICD-10-CM | POA: Diagnosis not present

## 2019-03-22 DIAGNOSIS — Z87440 Personal history of urinary (tract) infections: Secondary | ICD-10-CM | POA: Diagnosis not present

## 2019-03-22 DIAGNOSIS — Z7952 Long term (current) use of systemic steroids: Secondary | ICD-10-CM | POA: Diagnosis not present

## 2019-03-23 DIAGNOSIS — Z792 Long term (current) use of antibiotics: Secondary | ICD-10-CM | POA: Diagnosis not present

## 2019-03-23 DIAGNOSIS — R2689 Other abnormalities of gait and mobility: Secondary | ICD-10-CM | POA: Diagnosis not present

## 2019-03-23 DIAGNOSIS — E785 Hyperlipidemia, unspecified: Secondary | ICD-10-CM | POA: Diagnosis not present

## 2019-03-23 DIAGNOSIS — R2681 Unsteadiness on feet: Secondary | ICD-10-CM | POA: Diagnosis not present

## 2019-03-23 DIAGNOSIS — I5032 Chronic diastolic (congestive) heart failure: Secondary | ICD-10-CM | POA: Diagnosis not present

## 2019-03-23 DIAGNOSIS — J440 Chronic obstructive pulmonary disease with acute lower respiratory infection: Secondary | ICD-10-CM | POA: Diagnosis not present

## 2019-03-23 DIAGNOSIS — Z9181 History of falling: Secondary | ICD-10-CM | POA: Diagnosis not present

## 2019-03-23 DIAGNOSIS — M069 Rheumatoid arthritis, unspecified: Secondary | ICD-10-CM | POA: Diagnosis not present

## 2019-03-23 DIAGNOSIS — Z7984 Long term (current) use of oral hypoglycemic drugs: Secondary | ICD-10-CM | POA: Diagnosis not present

## 2019-03-23 DIAGNOSIS — J441 Chronic obstructive pulmonary disease with (acute) exacerbation: Secondary | ICD-10-CM | POA: Diagnosis not present

## 2019-03-23 DIAGNOSIS — Z7952 Long term (current) use of systemic steroids: Secondary | ICD-10-CM | POA: Diagnosis not present

## 2019-03-23 DIAGNOSIS — Z7951 Long term (current) use of inhaled steroids: Secondary | ICD-10-CM | POA: Diagnosis not present

## 2019-03-23 DIAGNOSIS — Z87440 Personal history of urinary (tract) infections: Secondary | ICD-10-CM | POA: Diagnosis not present

## 2019-03-23 DIAGNOSIS — N390 Urinary tract infection, site not specified: Secondary | ICD-10-CM | POA: Diagnosis not present

## 2019-03-23 DIAGNOSIS — E039 Hypothyroidism, unspecified: Secondary | ICD-10-CM | POA: Diagnosis not present

## 2019-03-23 DIAGNOSIS — M1991 Primary osteoarthritis, unspecified site: Secondary | ICD-10-CM | POA: Diagnosis not present

## 2019-03-23 DIAGNOSIS — Z87891 Personal history of nicotine dependence: Secondary | ICD-10-CM | POA: Diagnosis not present

## 2019-03-23 DIAGNOSIS — Z9981 Dependence on supplemental oxygen: Secondary | ICD-10-CM | POA: Diagnosis not present

## 2019-03-23 DIAGNOSIS — I110 Hypertensive heart disease with heart failure: Secondary | ICD-10-CM | POA: Diagnosis not present

## 2019-03-23 DIAGNOSIS — E119 Type 2 diabetes mellitus without complications: Secondary | ICD-10-CM | POA: Diagnosis not present

## 2019-03-23 DIAGNOSIS — D509 Iron deficiency anemia, unspecified: Secondary | ICD-10-CM | POA: Diagnosis not present

## 2019-03-23 DIAGNOSIS — J189 Pneumonia, unspecified organism: Secondary | ICD-10-CM | POA: Diagnosis not present

## 2019-03-26 DIAGNOSIS — E039 Hypothyroidism, unspecified: Secondary | ICD-10-CM | POA: Diagnosis not present

## 2019-03-26 DIAGNOSIS — R2689 Other abnormalities of gait and mobility: Secondary | ICD-10-CM | POA: Diagnosis not present

## 2019-03-26 DIAGNOSIS — J189 Pneumonia, unspecified organism: Secondary | ICD-10-CM | POA: Diagnosis not present

## 2019-03-26 DIAGNOSIS — I110 Hypertensive heart disease with heart failure: Secondary | ICD-10-CM | POA: Diagnosis not present

## 2019-03-26 DIAGNOSIS — J441 Chronic obstructive pulmonary disease with (acute) exacerbation: Secondary | ICD-10-CM | POA: Diagnosis not present

## 2019-03-26 DIAGNOSIS — I5032 Chronic diastolic (congestive) heart failure: Secondary | ICD-10-CM | POA: Diagnosis not present

## 2019-03-26 DIAGNOSIS — Z792 Long term (current) use of antibiotics: Secondary | ICD-10-CM | POA: Diagnosis not present

## 2019-03-26 DIAGNOSIS — E119 Type 2 diabetes mellitus without complications: Secondary | ICD-10-CM | POA: Diagnosis not present

## 2019-03-26 DIAGNOSIS — N390 Urinary tract infection, site not specified: Secondary | ICD-10-CM | POA: Diagnosis not present

## 2019-03-26 DIAGNOSIS — Z7984 Long term (current) use of oral hypoglycemic drugs: Secondary | ICD-10-CM | POA: Diagnosis not present

## 2019-03-26 DIAGNOSIS — R2681 Unsteadiness on feet: Secondary | ICD-10-CM | POA: Diagnosis not present

## 2019-03-26 DIAGNOSIS — Z87891 Personal history of nicotine dependence: Secondary | ICD-10-CM | POA: Diagnosis not present

## 2019-03-26 DIAGNOSIS — Z7952 Long term (current) use of systemic steroids: Secondary | ICD-10-CM | POA: Diagnosis not present

## 2019-03-26 DIAGNOSIS — M1991 Primary osteoarthritis, unspecified site: Secondary | ICD-10-CM | POA: Diagnosis not present

## 2019-03-26 DIAGNOSIS — M069 Rheumatoid arthritis, unspecified: Secondary | ICD-10-CM | POA: Diagnosis not present

## 2019-03-26 DIAGNOSIS — D509 Iron deficiency anemia, unspecified: Secondary | ICD-10-CM | POA: Diagnosis not present

## 2019-03-26 DIAGNOSIS — J440 Chronic obstructive pulmonary disease with acute lower respiratory infection: Secondary | ICD-10-CM | POA: Diagnosis not present

## 2019-03-26 DIAGNOSIS — Z9981 Dependence on supplemental oxygen: Secondary | ICD-10-CM | POA: Diagnosis not present

## 2019-03-26 DIAGNOSIS — Z87440 Personal history of urinary (tract) infections: Secondary | ICD-10-CM | POA: Diagnosis not present

## 2019-03-26 DIAGNOSIS — E785 Hyperlipidemia, unspecified: Secondary | ICD-10-CM | POA: Diagnosis not present

## 2019-03-26 DIAGNOSIS — Z7951 Long term (current) use of inhaled steroids: Secondary | ICD-10-CM | POA: Diagnosis not present

## 2019-03-26 DIAGNOSIS — Z9181 History of falling: Secondary | ICD-10-CM | POA: Diagnosis not present

## 2019-03-27 DIAGNOSIS — M1991 Primary osteoarthritis, unspecified site: Secondary | ICD-10-CM | POA: Diagnosis not present

## 2019-03-27 DIAGNOSIS — J189 Pneumonia, unspecified organism: Secondary | ICD-10-CM | POA: Diagnosis not present

## 2019-03-27 DIAGNOSIS — Z9181 History of falling: Secondary | ICD-10-CM | POA: Diagnosis not present

## 2019-03-27 DIAGNOSIS — E785 Hyperlipidemia, unspecified: Secondary | ICD-10-CM | POA: Diagnosis not present

## 2019-03-27 DIAGNOSIS — Z7951 Long term (current) use of inhaled steroids: Secondary | ICD-10-CM | POA: Diagnosis not present

## 2019-03-27 DIAGNOSIS — Z7952 Long term (current) use of systemic steroids: Secondary | ICD-10-CM | POA: Diagnosis not present

## 2019-03-27 DIAGNOSIS — R2689 Other abnormalities of gait and mobility: Secondary | ICD-10-CM | POA: Diagnosis not present

## 2019-03-27 DIAGNOSIS — J440 Chronic obstructive pulmonary disease with acute lower respiratory infection: Secondary | ICD-10-CM | POA: Diagnosis not present

## 2019-03-27 DIAGNOSIS — R2681 Unsteadiness on feet: Secondary | ICD-10-CM | POA: Diagnosis not present

## 2019-03-27 DIAGNOSIS — I5032 Chronic diastolic (congestive) heart failure: Secondary | ICD-10-CM | POA: Diagnosis not present

## 2019-03-27 DIAGNOSIS — Z9981 Dependence on supplemental oxygen: Secondary | ICD-10-CM | POA: Diagnosis not present

## 2019-03-27 DIAGNOSIS — J441 Chronic obstructive pulmonary disease with (acute) exacerbation: Secondary | ICD-10-CM | POA: Diagnosis not present

## 2019-03-27 DIAGNOSIS — Z87440 Personal history of urinary (tract) infections: Secondary | ICD-10-CM | POA: Diagnosis not present

## 2019-03-27 DIAGNOSIS — M069 Rheumatoid arthritis, unspecified: Secondary | ICD-10-CM | POA: Diagnosis not present

## 2019-03-27 DIAGNOSIS — Z792 Long term (current) use of antibiotics: Secondary | ICD-10-CM | POA: Diagnosis not present

## 2019-03-27 DIAGNOSIS — Z87891 Personal history of nicotine dependence: Secondary | ICD-10-CM | POA: Diagnosis not present

## 2019-03-27 DIAGNOSIS — N390 Urinary tract infection, site not specified: Secondary | ICD-10-CM | POA: Diagnosis not present

## 2019-03-27 DIAGNOSIS — Z7984 Long term (current) use of oral hypoglycemic drugs: Secondary | ICD-10-CM | POA: Diagnosis not present

## 2019-03-27 DIAGNOSIS — D509 Iron deficiency anemia, unspecified: Secondary | ICD-10-CM | POA: Diagnosis not present

## 2019-03-27 DIAGNOSIS — E119 Type 2 diabetes mellitus without complications: Secondary | ICD-10-CM | POA: Diagnosis not present

## 2019-03-27 DIAGNOSIS — E039 Hypothyroidism, unspecified: Secondary | ICD-10-CM | POA: Diagnosis not present

## 2019-03-27 DIAGNOSIS — I110 Hypertensive heart disease with heart failure: Secondary | ICD-10-CM | POA: Diagnosis not present

## 2019-04-02 DIAGNOSIS — M069 Rheumatoid arthritis, unspecified: Secondary | ICD-10-CM | POA: Diagnosis not present

## 2019-04-02 DIAGNOSIS — D509 Iron deficiency anemia, unspecified: Secondary | ICD-10-CM | POA: Diagnosis not present

## 2019-04-02 DIAGNOSIS — Z87891 Personal history of nicotine dependence: Secondary | ICD-10-CM | POA: Diagnosis not present

## 2019-04-02 DIAGNOSIS — M1991 Primary osteoarthritis, unspecified site: Secondary | ICD-10-CM | POA: Diagnosis not present

## 2019-04-02 DIAGNOSIS — E039 Hypothyroidism, unspecified: Secondary | ICD-10-CM | POA: Diagnosis not present

## 2019-04-02 DIAGNOSIS — I110 Hypertensive heart disease with heart failure: Secondary | ICD-10-CM | POA: Diagnosis not present

## 2019-04-02 DIAGNOSIS — Z792 Long term (current) use of antibiotics: Secondary | ICD-10-CM | POA: Diagnosis not present

## 2019-04-02 DIAGNOSIS — J440 Chronic obstructive pulmonary disease with acute lower respiratory infection: Secondary | ICD-10-CM | POA: Diagnosis not present

## 2019-04-02 DIAGNOSIS — Z7951 Long term (current) use of inhaled steroids: Secondary | ICD-10-CM | POA: Diagnosis not present

## 2019-04-02 DIAGNOSIS — R2689 Other abnormalities of gait and mobility: Secondary | ICD-10-CM | POA: Diagnosis not present

## 2019-04-02 DIAGNOSIS — N390 Urinary tract infection, site not specified: Secondary | ICD-10-CM | POA: Diagnosis not present

## 2019-04-02 DIAGNOSIS — Z9981 Dependence on supplemental oxygen: Secondary | ICD-10-CM | POA: Diagnosis not present

## 2019-04-02 DIAGNOSIS — Z7984 Long term (current) use of oral hypoglycemic drugs: Secondary | ICD-10-CM | POA: Diagnosis not present

## 2019-04-02 DIAGNOSIS — I5032 Chronic diastolic (congestive) heart failure: Secondary | ICD-10-CM | POA: Diagnosis not present

## 2019-04-02 DIAGNOSIS — J189 Pneumonia, unspecified organism: Secondary | ICD-10-CM | POA: Diagnosis not present

## 2019-04-02 DIAGNOSIS — E785 Hyperlipidemia, unspecified: Secondary | ICD-10-CM | POA: Diagnosis not present

## 2019-04-02 DIAGNOSIS — J441 Chronic obstructive pulmonary disease with (acute) exacerbation: Secondary | ICD-10-CM | POA: Diagnosis not present

## 2019-04-02 DIAGNOSIS — Z87440 Personal history of urinary (tract) infections: Secondary | ICD-10-CM | POA: Diagnosis not present

## 2019-04-02 DIAGNOSIS — E119 Type 2 diabetes mellitus without complications: Secondary | ICD-10-CM | POA: Diagnosis not present

## 2019-04-02 DIAGNOSIS — R2681 Unsteadiness on feet: Secondary | ICD-10-CM | POA: Diagnosis not present

## 2019-04-02 DIAGNOSIS — Z9181 History of falling: Secondary | ICD-10-CM | POA: Diagnosis not present

## 2019-04-02 DIAGNOSIS — Z7952 Long term (current) use of systemic steroids: Secondary | ICD-10-CM | POA: Diagnosis not present

## 2019-04-03 DIAGNOSIS — J441 Chronic obstructive pulmonary disease with (acute) exacerbation: Secondary | ICD-10-CM | POA: Diagnosis not present

## 2019-04-03 DIAGNOSIS — Z87891 Personal history of nicotine dependence: Secondary | ICD-10-CM | POA: Diagnosis not present

## 2019-04-03 DIAGNOSIS — Z7952 Long term (current) use of systemic steroids: Secondary | ICD-10-CM | POA: Diagnosis not present

## 2019-04-03 DIAGNOSIS — M1991 Primary osteoarthritis, unspecified site: Secondary | ICD-10-CM | POA: Diagnosis not present

## 2019-04-03 DIAGNOSIS — R2689 Other abnormalities of gait and mobility: Secondary | ICD-10-CM | POA: Diagnosis not present

## 2019-04-03 DIAGNOSIS — R2681 Unsteadiness on feet: Secondary | ICD-10-CM | POA: Diagnosis not present

## 2019-04-03 DIAGNOSIS — Z9181 History of falling: Secondary | ICD-10-CM | POA: Diagnosis not present

## 2019-04-03 DIAGNOSIS — J440 Chronic obstructive pulmonary disease with acute lower respiratory infection: Secondary | ICD-10-CM | POA: Diagnosis not present

## 2019-04-03 DIAGNOSIS — Z792 Long term (current) use of antibiotics: Secondary | ICD-10-CM | POA: Diagnosis not present

## 2019-04-03 DIAGNOSIS — M069 Rheumatoid arthritis, unspecified: Secondary | ICD-10-CM | POA: Diagnosis not present

## 2019-04-03 DIAGNOSIS — E119 Type 2 diabetes mellitus without complications: Secondary | ICD-10-CM | POA: Diagnosis not present

## 2019-04-03 DIAGNOSIS — I5032 Chronic diastolic (congestive) heart failure: Secondary | ICD-10-CM | POA: Diagnosis not present

## 2019-04-03 DIAGNOSIS — I110 Hypertensive heart disease with heart failure: Secondary | ICD-10-CM | POA: Diagnosis not present

## 2019-04-03 DIAGNOSIS — J189 Pneumonia, unspecified organism: Secondary | ICD-10-CM | POA: Diagnosis not present

## 2019-04-03 DIAGNOSIS — E785 Hyperlipidemia, unspecified: Secondary | ICD-10-CM | POA: Diagnosis not present

## 2019-04-03 DIAGNOSIS — N390 Urinary tract infection, site not specified: Secondary | ICD-10-CM | POA: Diagnosis not present

## 2019-04-03 DIAGNOSIS — Z7951 Long term (current) use of inhaled steroids: Secondary | ICD-10-CM | POA: Diagnosis not present

## 2019-04-03 DIAGNOSIS — Z7984 Long term (current) use of oral hypoglycemic drugs: Secondary | ICD-10-CM | POA: Diagnosis not present

## 2019-04-03 DIAGNOSIS — Z87440 Personal history of urinary (tract) infections: Secondary | ICD-10-CM | POA: Diagnosis not present

## 2019-04-03 DIAGNOSIS — Z9981 Dependence on supplemental oxygen: Secondary | ICD-10-CM | POA: Diagnosis not present

## 2019-04-03 DIAGNOSIS — E039 Hypothyroidism, unspecified: Secondary | ICD-10-CM | POA: Diagnosis not present

## 2019-04-03 DIAGNOSIS — D509 Iron deficiency anemia, unspecified: Secondary | ICD-10-CM | POA: Diagnosis not present

## 2019-04-05 DIAGNOSIS — J441 Chronic obstructive pulmonary disease with (acute) exacerbation: Secondary | ICD-10-CM | POA: Diagnosis not present

## 2019-04-05 DIAGNOSIS — M1991 Primary osteoarthritis, unspecified site: Secondary | ICD-10-CM | POA: Diagnosis not present

## 2019-04-05 DIAGNOSIS — J440 Chronic obstructive pulmonary disease with acute lower respiratory infection: Secondary | ICD-10-CM | POA: Diagnosis not present

## 2019-04-05 DIAGNOSIS — Z87891 Personal history of nicotine dependence: Secondary | ICD-10-CM | POA: Diagnosis not present

## 2019-04-05 DIAGNOSIS — Z7952 Long term (current) use of systemic steroids: Secondary | ICD-10-CM | POA: Diagnosis not present

## 2019-04-05 DIAGNOSIS — N390 Urinary tract infection, site not specified: Secondary | ICD-10-CM | POA: Diagnosis not present

## 2019-04-05 DIAGNOSIS — J189 Pneumonia, unspecified organism: Secondary | ICD-10-CM | POA: Diagnosis not present

## 2019-04-05 DIAGNOSIS — Z87440 Personal history of urinary (tract) infections: Secondary | ICD-10-CM | POA: Diagnosis not present

## 2019-04-05 DIAGNOSIS — Z9981 Dependence on supplemental oxygen: Secondary | ICD-10-CM | POA: Diagnosis not present

## 2019-04-05 DIAGNOSIS — R2689 Other abnormalities of gait and mobility: Secondary | ICD-10-CM | POA: Diagnosis not present

## 2019-04-05 DIAGNOSIS — Z7951 Long term (current) use of inhaled steroids: Secondary | ICD-10-CM | POA: Diagnosis not present

## 2019-04-05 DIAGNOSIS — R2681 Unsteadiness on feet: Secondary | ICD-10-CM | POA: Diagnosis not present

## 2019-04-05 DIAGNOSIS — I110 Hypertensive heart disease with heart failure: Secondary | ICD-10-CM | POA: Diagnosis not present

## 2019-04-05 DIAGNOSIS — Z7984 Long term (current) use of oral hypoglycemic drugs: Secondary | ICD-10-CM | POA: Diagnosis not present

## 2019-04-05 DIAGNOSIS — Z9181 History of falling: Secondary | ICD-10-CM | POA: Diagnosis not present

## 2019-04-05 DIAGNOSIS — E785 Hyperlipidemia, unspecified: Secondary | ICD-10-CM | POA: Diagnosis not present

## 2019-04-05 DIAGNOSIS — I5032 Chronic diastolic (congestive) heart failure: Secondary | ICD-10-CM | POA: Diagnosis not present

## 2019-04-05 DIAGNOSIS — Z792 Long term (current) use of antibiotics: Secondary | ICD-10-CM | POA: Diagnosis not present

## 2019-04-05 DIAGNOSIS — M069 Rheumatoid arthritis, unspecified: Secondary | ICD-10-CM | POA: Diagnosis not present

## 2019-04-05 DIAGNOSIS — D509 Iron deficiency anemia, unspecified: Secondary | ICD-10-CM | POA: Diagnosis not present

## 2019-04-05 DIAGNOSIS — E039 Hypothyroidism, unspecified: Secondary | ICD-10-CM | POA: Diagnosis not present

## 2019-04-05 DIAGNOSIS — E119 Type 2 diabetes mellitus without complications: Secondary | ICD-10-CM | POA: Diagnosis not present

## 2019-04-09 DIAGNOSIS — R2689 Other abnormalities of gait and mobility: Secondary | ICD-10-CM | POA: Diagnosis not present

## 2019-04-09 DIAGNOSIS — M1991 Primary osteoarthritis, unspecified site: Secondary | ICD-10-CM | POA: Diagnosis not present

## 2019-04-09 DIAGNOSIS — M069 Rheumatoid arthritis, unspecified: Secondary | ICD-10-CM | POA: Diagnosis not present

## 2019-04-09 DIAGNOSIS — Z9181 History of falling: Secondary | ICD-10-CM | POA: Diagnosis not present

## 2019-04-09 DIAGNOSIS — Z7984 Long term (current) use of oral hypoglycemic drugs: Secondary | ICD-10-CM | POA: Diagnosis not present

## 2019-04-09 DIAGNOSIS — I5032 Chronic diastolic (congestive) heart failure: Secondary | ICD-10-CM | POA: Diagnosis not present

## 2019-04-09 DIAGNOSIS — R2681 Unsteadiness on feet: Secondary | ICD-10-CM | POA: Diagnosis not present

## 2019-04-09 DIAGNOSIS — D509 Iron deficiency anemia, unspecified: Secondary | ICD-10-CM | POA: Diagnosis not present

## 2019-04-09 DIAGNOSIS — Z9981 Dependence on supplemental oxygen: Secondary | ICD-10-CM | POA: Diagnosis not present

## 2019-04-09 DIAGNOSIS — E785 Hyperlipidemia, unspecified: Secondary | ICD-10-CM | POA: Diagnosis not present

## 2019-04-09 DIAGNOSIS — J189 Pneumonia, unspecified organism: Secondary | ICD-10-CM | POA: Diagnosis not present

## 2019-04-09 DIAGNOSIS — Z87440 Personal history of urinary (tract) infections: Secondary | ICD-10-CM | POA: Diagnosis not present

## 2019-04-09 DIAGNOSIS — Z792 Long term (current) use of antibiotics: Secondary | ICD-10-CM | POA: Diagnosis not present

## 2019-04-09 DIAGNOSIS — N390 Urinary tract infection, site not specified: Secondary | ICD-10-CM | POA: Diagnosis not present

## 2019-04-09 DIAGNOSIS — I110 Hypertensive heart disease with heart failure: Secondary | ICD-10-CM | POA: Diagnosis not present

## 2019-04-09 DIAGNOSIS — Z7952 Long term (current) use of systemic steroids: Secondary | ICD-10-CM | POA: Diagnosis not present

## 2019-04-09 DIAGNOSIS — J440 Chronic obstructive pulmonary disease with acute lower respiratory infection: Secondary | ICD-10-CM | POA: Diagnosis not present

## 2019-04-09 DIAGNOSIS — J441 Chronic obstructive pulmonary disease with (acute) exacerbation: Secondary | ICD-10-CM | POA: Diagnosis not present

## 2019-04-09 DIAGNOSIS — Z7951 Long term (current) use of inhaled steroids: Secondary | ICD-10-CM | POA: Diagnosis not present

## 2019-04-09 DIAGNOSIS — Z87891 Personal history of nicotine dependence: Secondary | ICD-10-CM | POA: Diagnosis not present

## 2019-04-09 DIAGNOSIS — E119 Type 2 diabetes mellitus without complications: Secondary | ICD-10-CM | POA: Diagnosis not present

## 2019-04-09 DIAGNOSIS — E039 Hypothyroidism, unspecified: Secondary | ICD-10-CM | POA: Diagnosis not present

## 2019-04-11 DIAGNOSIS — Z7951 Long term (current) use of inhaled steroids: Secondary | ICD-10-CM | POA: Diagnosis not present

## 2019-04-11 DIAGNOSIS — R2689 Other abnormalities of gait and mobility: Secondary | ICD-10-CM | POA: Diagnosis not present

## 2019-04-11 DIAGNOSIS — Z7952 Long term (current) use of systemic steroids: Secondary | ICD-10-CM | POA: Diagnosis not present

## 2019-04-11 DIAGNOSIS — E119 Type 2 diabetes mellitus without complications: Secondary | ICD-10-CM | POA: Diagnosis not present

## 2019-04-11 DIAGNOSIS — M069 Rheumatoid arthritis, unspecified: Secondary | ICD-10-CM | POA: Diagnosis not present

## 2019-04-11 DIAGNOSIS — Z9181 History of falling: Secondary | ICD-10-CM | POA: Diagnosis not present

## 2019-04-11 DIAGNOSIS — E039 Hypothyroidism, unspecified: Secondary | ICD-10-CM | POA: Diagnosis not present

## 2019-04-11 DIAGNOSIS — J440 Chronic obstructive pulmonary disease with acute lower respiratory infection: Secondary | ICD-10-CM | POA: Diagnosis not present

## 2019-04-11 DIAGNOSIS — M1991 Primary osteoarthritis, unspecified site: Secondary | ICD-10-CM | POA: Diagnosis not present

## 2019-04-11 DIAGNOSIS — Z792 Long term (current) use of antibiotics: Secondary | ICD-10-CM | POA: Diagnosis not present

## 2019-04-11 DIAGNOSIS — Z9981 Dependence on supplemental oxygen: Secondary | ICD-10-CM | POA: Diagnosis not present

## 2019-04-11 DIAGNOSIS — Z87440 Personal history of urinary (tract) infections: Secondary | ICD-10-CM | POA: Diagnosis not present

## 2019-04-11 DIAGNOSIS — J189 Pneumonia, unspecified organism: Secondary | ICD-10-CM | POA: Diagnosis not present

## 2019-04-11 DIAGNOSIS — Z87891 Personal history of nicotine dependence: Secondary | ICD-10-CM | POA: Diagnosis not present

## 2019-04-11 DIAGNOSIS — Z7984 Long term (current) use of oral hypoglycemic drugs: Secondary | ICD-10-CM | POA: Diagnosis not present

## 2019-04-11 DIAGNOSIS — I110 Hypertensive heart disease with heart failure: Secondary | ICD-10-CM | POA: Diagnosis not present

## 2019-04-11 DIAGNOSIS — I5032 Chronic diastolic (congestive) heart failure: Secondary | ICD-10-CM | POA: Diagnosis not present

## 2019-04-11 DIAGNOSIS — E785 Hyperlipidemia, unspecified: Secondary | ICD-10-CM | POA: Diagnosis not present

## 2019-04-11 DIAGNOSIS — J441 Chronic obstructive pulmonary disease with (acute) exacerbation: Secondary | ICD-10-CM | POA: Diagnosis not present

## 2019-04-11 DIAGNOSIS — J449 Chronic obstructive pulmonary disease, unspecified: Secondary | ICD-10-CM | POA: Diagnosis not present

## 2019-04-11 DIAGNOSIS — N390 Urinary tract infection, site not specified: Secondary | ICD-10-CM | POA: Diagnosis not present

## 2019-04-11 DIAGNOSIS — R2681 Unsteadiness on feet: Secondary | ICD-10-CM | POA: Diagnosis not present

## 2019-04-11 DIAGNOSIS — D509 Iron deficiency anemia, unspecified: Secondary | ICD-10-CM | POA: Diagnosis not present

## 2019-04-12 DIAGNOSIS — Z7984 Long term (current) use of oral hypoglycemic drugs: Secondary | ICD-10-CM | POA: Diagnosis not present

## 2019-04-12 DIAGNOSIS — Z9981 Dependence on supplemental oxygen: Secondary | ICD-10-CM | POA: Diagnosis not present

## 2019-04-12 DIAGNOSIS — R2689 Other abnormalities of gait and mobility: Secondary | ICD-10-CM | POA: Diagnosis not present

## 2019-04-12 DIAGNOSIS — N390 Urinary tract infection, site not specified: Secondary | ICD-10-CM | POA: Diagnosis not present

## 2019-04-12 DIAGNOSIS — I5032 Chronic diastolic (congestive) heart failure: Secondary | ICD-10-CM | POA: Diagnosis not present

## 2019-04-12 DIAGNOSIS — Z792 Long term (current) use of antibiotics: Secondary | ICD-10-CM | POA: Diagnosis not present

## 2019-04-12 DIAGNOSIS — M069 Rheumatoid arthritis, unspecified: Secondary | ICD-10-CM | POA: Diagnosis not present

## 2019-04-12 DIAGNOSIS — J440 Chronic obstructive pulmonary disease with acute lower respiratory infection: Secondary | ICD-10-CM | POA: Diagnosis not present

## 2019-04-12 DIAGNOSIS — Z7951 Long term (current) use of inhaled steroids: Secondary | ICD-10-CM | POA: Diagnosis not present

## 2019-04-12 DIAGNOSIS — J189 Pneumonia, unspecified organism: Secondary | ICD-10-CM | POA: Diagnosis not present

## 2019-04-12 DIAGNOSIS — Z87440 Personal history of urinary (tract) infections: Secondary | ICD-10-CM | POA: Diagnosis not present

## 2019-04-12 DIAGNOSIS — E039 Hypothyroidism, unspecified: Secondary | ICD-10-CM | POA: Diagnosis not present

## 2019-04-12 DIAGNOSIS — E119 Type 2 diabetes mellitus without complications: Secondary | ICD-10-CM | POA: Diagnosis not present

## 2019-04-12 DIAGNOSIS — M1991 Primary osteoarthritis, unspecified site: Secondary | ICD-10-CM | POA: Diagnosis not present

## 2019-04-12 DIAGNOSIS — Z9181 History of falling: Secondary | ICD-10-CM | POA: Diagnosis not present

## 2019-04-12 DIAGNOSIS — I110 Hypertensive heart disease with heart failure: Secondary | ICD-10-CM | POA: Diagnosis not present

## 2019-04-12 DIAGNOSIS — Z7952 Long term (current) use of systemic steroids: Secondary | ICD-10-CM | POA: Diagnosis not present

## 2019-04-12 DIAGNOSIS — R2681 Unsteadiness on feet: Secondary | ICD-10-CM | POA: Diagnosis not present

## 2019-04-12 DIAGNOSIS — J441 Chronic obstructive pulmonary disease with (acute) exacerbation: Secondary | ICD-10-CM | POA: Diagnosis not present

## 2019-04-12 DIAGNOSIS — E785 Hyperlipidemia, unspecified: Secondary | ICD-10-CM | POA: Diagnosis not present

## 2019-04-12 DIAGNOSIS — D509 Iron deficiency anemia, unspecified: Secondary | ICD-10-CM | POA: Diagnosis not present

## 2019-04-12 DIAGNOSIS — Z87891 Personal history of nicotine dependence: Secondary | ICD-10-CM | POA: Diagnosis not present

## 2019-04-16 DIAGNOSIS — Z7952 Long term (current) use of systemic steroids: Secondary | ICD-10-CM | POA: Diagnosis not present

## 2019-04-16 DIAGNOSIS — Z9181 History of falling: Secondary | ICD-10-CM | POA: Diagnosis not present

## 2019-04-16 DIAGNOSIS — E119 Type 2 diabetes mellitus without complications: Secondary | ICD-10-CM | POA: Diagnosis not present

## 2019-04-16 DIAGNOSIS — M1991 Primary osteoarthritis, unspecified site: Secondary | ICD-10-CM | POA: Diagnosis not present

## 2019-04-16 DIAGNOSIS — J189 Pneumonia, unspecified organism: Secondary | ICD-10-CM | POA: Diagnosis not present

## 2019-04-16 DIAGNOSIS — Z9981 Dependence on supplemental oxygen: Secondary | ICD-10-CM | POA: Diagnosis not present

## 2019-04-16 DIAGNOSIS — I5032 Chronic diastolic (congestive) heart failure: Secondary | ICD-10-CM | POA: Diagnosis not present

## 2019-04-16 DIAGNOSIS — J441 Chronic obstructive pulmonary disease with (acute) exacerbation: Secondary | ICD-10-CM | POA: Diagnosis not present

## 2019-04-16 DIAGNOSIS — R2689 Other abnormalities of gait and mobility: Secondary | ICD-10-CM | POA: Diagnosis not present

## 2019-04-16 DIAGNOSIS — D509 Iron deficiency anemia, unspecified: Secondary | ICD-10-CM | POA: Diagnosis not present

## 2019-04-16 DIAGNOSIS — M069 Rheumatoid arthritis, unspecified: Secondary | ICD-10-CM | POA: Diagnosis not present

## 2019-04-16 DIAGNOSIS — R2681 Unsteadiness on feet: Secondary | ICD-10-CM | POA: Diagnosis not present

## 2019-04-16 DIAGNOSIS — Z7984 Long term (current) use of oral hypoglycemic drugs: Secondary | ICD-10-CM | POA: Diagnosis not present

## 2019-04-16 DIAGNOSIS — N390 Urinary tract infection, site not specified: Secondary | ICD-10-CM | POA: Diagnosis not present

## 2019-04-16 DIAGNOSIS — Z792 Long term (current) use of antibiotics: Secondary | ICD-10-CM | POA: Diagnosis not present

## 2019-04-16 DIAGNOSIS — J440 Chronic obstructive pulmonary disease with acute lower respiratory infection: Secondary | ICD-10-CM | POA: Diagnosis not present

## 2019-04-16 DIAGNOSIS — Z87440 Personal history of urinary (tract) infections: Secondary | ICD-10-CM | POA: Diagnosis not present

## 2019-04-16 DIAGNOSIS — Z7951 Long term (current) use of inhaled steroids: Secondary | ICD-10-CM | POA: Diagnosis not present

## 2019-04-16 DIAGNOSIS — E785 Hyperlipidemia, unspecified: Secondary | ICD-10-CM | POA: Diagnosis not present

## 2019-04-16 DIAGNOSIS — E039 Hypothyroidism, unspecified: Secondary | ICD-10-CM | POA: Diagnosis not present

## 2019-04-16 DIAGNOSIS — Z87891 Personal history of nicotine dependence: Secondary | ICD-10-CM | POA: Diagnosis not present

## 2019-04-16 DIAGNOSIS — I110 Hypertensive heart disease with heart failure: Secondary | ICD-10-CM | POA: Diagnosis not present

## 2019-04-18 DIAGNOSIS — E785 Hyperlipidemia, unspecified: Secondary | ICD-10-CM | POA: Diagnosis not present

## 2019-04-18 DIAGNOSIS — Z7952 Long term (current) use of systemic steroids: Secondary | ICD-10-CM | POA: Diagnosis not present

## 2019-04-18 DIAGNOSIS — R2681 Unsteadiness on feet: Secondary | ICD-10-CM | POA: Diagnosis not present

## 2019-04-18 DIAGNOSIS — Z87440 Personal history of urinary (tract) infections: Secondary | ICD-10-CM | POA: Diagnosis not present

## 2019-04-18 DIAGNOSIS — M069 Rheumatoid arthritis, unspecified: Secondary | ICD-10-CM | POA: Diagnosis not present

## 2019-04-18 DIAGNOSIS — Z7984 Long term (current) use of oral hypoglycemic drugs: Secondary | ICD-10-CM | POA: Diagnosis not present

## 2019-04-18 DIAGNOSIS — I110 Hypertensive heart disease with heart failure: Secondary | ICD-10-CM | POA: Diagnosis not present

## 2019-04-18 DIAGNOSIS — J189 Pneumonia, unspecified organism: Secondary | ICD-10-CM | POA: Diagnosis not present

## 2019-04-18 DIAGNOSIS — M1991 Primary osteoarthritis, unspecified site: Secondary | ICD-10-CM | POA: Diagnosis not present

## 2019-04-18 DIAGNOSIS — J440 Chronic obstructive pulmonary disease with acute lower respiratory infection: Secondary | ICD-10-CM | POA: Diagnosis not present

## 2019-04-18 DIAGNOSIS — Z87891 Personal history of nicotine dependence: Secondary | ICD-10-CM | POA: Diagnosis not present

## 2019-04-18 DIAGNOSIS — N390 Urinary tract infection, site not specified: Secondary | ICD-10-CM | POA: Diagnosis not present

## 2019-04-18 DIAGNOSIS — R2689 Other abnormalities of gait and mobility: Secondary | ICD-10-CM | POA: Diagnosis not present

## 2019-04-18 DIAGNOSIS — E039 Hypothyroidism, unspecified: Secondary | ICD-10-CM | POA: Diagnosis not present

## 2019-04-18 DIAGNOSIS — Z7951 Long term (current) use of inhaled steroids: Secondary | ICD-10-CM | POA: Diagnosis not present

## 2019-04-18 DIAGNOSIS — D509 Iron deficiency anemia, unspecified: Secondary | ICD-10-CM | POA: Diagnosis not present

## 2019-04-18 DIAGNOSIS — I5032 Chronic diastolic (congestive) heart failure: Secondary | ICD-10-CM | POA: Diagnosis not present

## 2019-04-18 DIAGNOSIS — Z792 Long term (current) use of antibiotics: Secondary | ICD-10-CM | POA: Diagnosis not present

## 2019-04-18 DIAGNOSIS — J441 Chronic obstructive pulmonary disease with (acute) exacerbation: Secondary | ICD-10-CM | POA: Diagnosis not present

## 2019-04-18 DIAGNOSIS — Z9181 History of falling: Secondary | ICD-10-CM | POA: Diagnosis not present

## 2019-04-18 DIAGNOSIS — Z9981 Dependence on supplemental oxygen: Secondary | ICD-10-CM | POA: Diagnosis not present

## 2019-04-18 DIAGNOSIS — E119 Type 2 diabetes mellitus without complications: Secondary | ICD-10-CM | POA: Diagnosis not present

## 2019-04-20 DIAGNOSIS — Z7952 Long term (current) use of systemic steroids: Secondary | ICD-10-CM | POA: Diagnosis not present

## 2019-04-20 DIAGNOSIS — M1991 Primary osteoarthritis, unspecified site: Secondary | ICD-10-CM | POA: Diagnosis not present

## 2019-04-20 DIAGNOSIS — M069 Rheumatoid arthritis, unspecified: Secondary | ICD-10-CM | POA: Diagnosis not present

## 2019-04-20 DIAGNOSIS — J189 Pneumonia, unspecified organism: Secondary | ICD-10-CM | POA: Diagnosis not present

## 2019-04-20 DIAGNOSIS — I110 Hypertensive heart disease with heart failure: Secondary | ICD-10-CM | POA: Diagnosis not present

## 2019-04-20 DIAGNOSIS — J441 Chronic obstructive pulmonary disease with (acute) exacerbation: Secondary | ICD-10-CM | POA: Diagnosis not present

## 2019-04-20 DIAGNOSIS — J440 Chronic obstructive pulmonary disease with acute lower respiratory infection: Secondary | ICD-10-CM | POA: Diagnosis not present

## 2019-04-20 DIAGNOSIS — Z7984 Long term (current) use of oral hypoglycemic drugs: Secondary | ICD-10-CM | POA: Diagnosis not present

## 2019-04-20 DIAGNOSIS — D509 Iron deficiency anemia, unspecified: Secondary | ICD-10-CM | POA: Diagnosis not present

## 2019-04-20 DIAGNOSIS — Z9981 Dependence on supplemental oxygen: Secondary | ICD-10-CM | POA: Diagnosis not present

## 2019-04-20 DIAGNOSIS — E119 Type 2 diabetes mellitus without complications: Secondary | ICD-10-CM | POA: Diagnosis not present

## 2019-04-20 DIAGNOSIS — E785 Hyperlipidemia, unspecified: Secondary | ICD-10-CM | POA: Diagnosis not present

## 2019-04-20 DIAGNOSIS — Z9181 History of falling: Secondary | ICD-10-CM | POA: Diagnosis not present

## 2019-04-20 DIAGNOSIS — Z792 Long term (current) use of antibiotics: Secondary | ICD-10-CM | POA: Diagnosis not present

## 2019-04-20 DIAGNOSIS — R2689 Other abnormalities of gait and mobility: Secondary | ICD-10-CM | POA: Diagnosis not present

## 2019-04-20 DIAGNOSIS — I5032 Chronic diastolic (congestive) heart failure: Secondary | ICD-10-CM | POA: Diagnosis not present

## 2019-04-20 DIAGNOSIS — N390 Urinary tract infection, site not specified: Secondary | ICD-10-CM | POA: Diagnosis not present

## 2019-04-20 DIAGNOSIS — Z87440 Personal history of urinary (tract) infections: Secondary | ICD-10-CM | POA: Diagnosis not present

## 2019-04-20 DIAGNOSIS — Z87891 Personal history of nicotine dependence: Secondary | ICD-10-CM | POA: Diagnosis not present

## 2019-04-20 DIAGNOSIS — R2681 Unsteadiness on feet: Secondary | ICD-10-CM | POA: Diagnosis not present

## 2019-04-20 DIAGNOSIS — Z7951 Long term (current) use of inhaled steroids: Secondary | ICD-10-CM | POA: Diagnosis not present

## 2019-04-20 DIAGNOSIS — E039 Hypothyroidism, unspecified: Secondary | ICD-10-CM | POA: Diagnosis not present

## 2019-04-23 DIAGNOSIS — J440 Chronic obstructive pulmonary disease with acute lower respiratory infection: Secondary | ICD-10-CM | POA: Diagnosis not present

## 2019-04-23 DIAGNOSIS — M1991 Primary osteoarthritis, unspecified site: Secondary | ICD-10-CM | POA: Diagnosis not present

## 2019-04-23 DIAGNOSIS — D509 Iron deficiency anemia, unspecified: Secondary | ICD-10-CM | POA: Diagnosis not present

## 2019-04-23 DIAGNOSIS — Z792 Long term (current) use of antibiotics: Secondary | ICD-10-CM | POA: Diagnosis not present

## 2019-04-23 DIAGNOSIS — Z87891 Personal history of nicotine dependence: Secondary | ICD-10-CM | POA: Diagnosis not present

## 2019-04-23 DIAGNOSIS — Z7984 Long term (current) use of oral hypoglycemic drugs: Secondary | ICD-10-CM | POA: Diagnosis not present

## 2019-04-23 DIAGNOSIS — J189 Pneumonia, unspecified organism: Secondary | ICD-10-CM | POA: Diagnosis not present

## 2019-04-23 DIAGNOSIS — E119 Type 2 diabetes mellitus without complications: Secondary | ICD-10-CM | POA: Diagnosis not present

## 2019-04-23 DIAGNOSIS — I5032 Chronic diastolic (congestive) heart failure: Secondary | ICD-10-CM | POA: Diagnosis not present

## 2019-04-23 DIAGNOSIS — N390 Urinary tract infection, site not specified: Secondary | ICD-10-CM | POA: Diagnosis not present

## 2019-04-23 DIAGNOSIS — M069 Rheumatoid arthritis, unspecified: Secondary | ICD-10-CM | POA: Diagnosis not present

## 2019-04-23 DIAGNOSIS — R2681 Unsteadiness on feet: Secondary | ICD-10-CM | POA: Diagnosis not present

## 2019-04-23 DIAGNOSIS — R2689 Other abnormalities of gait and mobility: Secondary | ICD-10-CM | POA: Diagnosis not present

## 2019-04-23 DIAGNOSIS — I110 Hypertensive heart disease with heart failure: Secondary | ICD-10-CM | POA: Diagnosis not present

## 2019-04-23 DIAGNOSIS — Z87440 Personal history of urinary (tract) infections: Secondary | ICD-10-CM | POA: Diagnosis not present

## 2019-04-23 DIAGNOSIS — E039 Hypothyroidism, unspecified: Secondary | ICD-10-CM | POA: Diagnosis not present

## 2019-04-23 DIAGNOSIS — Z7952 Long term (current) use of systemic steroids: Secondary | ICD-10-CM | POA: Diagnosis not present

## 2019-04-23 DIAGNOSIS — Z9181 History of falling: Secondary | ICD-10-CM | POA: Diagnosis not present

## 2019-04-23 DIAGNOSIS — Z9981 Dependence on supplemental oxygen: Secondary | ICD-10-CM | POA: Diagnosis not present

## 2019-04-23 DIAGNOSIS — Z7951 Long term (current) use of inhaled steroids: Secondary | ICD-10-CM | POA: Diagnosis not present

## 2019-04-23 DIAGNOSIS — E785 Hyperlipidemia, unspecified: Secondary | ICD-10-CM | POA: Diagnosis not present

## 2019-04-23 DIAGNOSIS — J441 Chronic obstructive pulmonary disease with (acute) exacerbation: Secondary | ICD-10-CM | POA: Diagnosis not present

## 2019-04-25 DIAGNOSIS — J440 Chronic obstructive pulmonary disease with acute lower respiratory infection: Secondary | ICD-10-CM | POA: Diagnosis not present

## 2019-04-25 DIAGNOSIS — Z87891 Personal history of nicotine dependence: Secondary | ICD-10-CM | POA: Diagnosis not present

## 2019-04-25 DIAGNOSIS — Z9181 History of falling: Secondary | ICD-10-CM | POA: Diagnosis not present

## 2019-04-25 DIAGNOSIS — M1991 Primary osteoarthritis, unspecified site: Secondary | ICD-10-CM | POA: Diagnosis not present

## 2019-04-25 DIAGNOSIS — E785 Hyperlipidemia, unspecified: Secondary | ICD-10-CM | POA: Diagnosis not present

## 2019-04-25 DIAGNOSIS — Z792 Long term (current) use of antibiotics: Secondary | ICD-10-CM | POA: Diagnosis not present

## 2019-04-25 DIAGNOSIS — I5032 Chronic diastolic (congestive) heart failure: Secondary | ICD-10-CM | POA: Diagnosis not present

## 2019-04-25 DIAGNOSIS — Z7952 Long term (current) use of systemic steroids: Secondary | ICD-10-CM | POA: Diagnosis not present

## 2019-04-25 DIAGNOSIS — E119 Type 2 diabetes mellitus without complications: Secondary | ICD-10-CM | POA: Diagnosis not present

## 2019-04-25 DIAGNOSIS — I110 Hypertensive heart disease with heart failure: Secondary | ICD-10-CM | POA: Diagnosis not present

## 2019-04-25 DIAGNOSIS — J441 Chronic obstructive pulmonary disease with (acute) exacerbation: Secondary | ICD-10-CM | POA: Diagnosis not present

## 2019-04-25 DIAGNOSIS — R2681 Unsteadiness on feet: Secondary | ICD-10-CM | POA: Diagnosis not present

## 2019-04-25 DIAGNOSIS — M069 Rheumatoid arthritis, unspecified: Secondary | ICD-10-CM | POA: Diagnosis not present

## 2019-04-25 DIAGNOSIS — J189 Pneumonia, unspecified organism: Secondary | ICD-10-CM | POA: Diagnosis not present

## 2019-04-25 DIAGNOSIS — Z7951 Long term (current) use of inhaled steroids: Secondary | ICD-10-CM | POA: Diagnosis not present

## 2019-04-25 DIAGNOSIS — E039 Hypothyroidism, unspecified: Secondary | ICD-10-CM | POA: Diagnosis not present

## 2019-04-25 DIAGNOSIS — Z9981 Dependence on supplemental oxygen: Secondary | ICD-10-CM | POA: Diagnosis not present

## 2019-04-25 DIAGNOSIS — Z7984 Long term (current) use of oral hypoglycemic drugs: Secondary | ICD-10-CM | POA: Diagnosis not present

## 2019-04-25 DIAGNOSIS — Z87440 Personal history of urinary (tract) infections: Secondary | ICD-10-CM | POA: Diagnosis not present

## 2019-04-25 DIAGNOSIS — R2689 Other abnormalities of gait and mobility: Secondary | ICD-10-CM | POA: Diagnosis not present

## 2019-04-25 DIAGNOSIS — N390 Urinary tract infection, site not specified: Secondary | ICD-10-CM | POA: Diagnosis not present

## 2019-04-25 DIAGNOSIS — D509 Iron deficiency anemia, unspecified: Secondary | ICD-10-CM | POA: Diagnosis not present

## 2019-04-26 DIAGNOSIS — Z9981 Dependence on supplemental oxygen: Secondary | ICD-10-CM | POA: Diagnosis not present

## 2019-04-26 DIAGNOSIS — D509 Iron deficiency anemia, unspecified: Secondary | ICD-10-CM | POA: Diagnosis not present

## 2019-04-26 DIAGNOSIS — I5032 Chronic diastolic (congestive) heart failure: Secondary | ICD-10-CM | POA: Diagnosis not present

## 2019-04-26 DIAGNOSIS — Z7984 Long term (current) use of oral hypoglycemic drugs: Secondary | ICD-10-CM | POA: Diagnosis not present

## 2019-04-26 DIAGNOSIS — J441 Chronic obstructive pulmonary disease with (acute) exacerbation: Secondary | ICD-10-CM | POA: Diagnosis not present

## 2019-04-26 DIAGNOSIS — N390 Urinary tract infection, site not specified: Secondary | ICD-10-CM | POA: Diagnosis not present

## 2019-04-26 DIAGNOSIS — M069 Rheumatoid arthritis, unspecified: Secondary | ICD-10-CM | POA: Diagnosis not present

## 2019-04-26 DIAGNOSIS — E785 Hyperlipidemia, unspecified: Secondary | ICD-10-CM | POA: Diagnosis not present

## 2019-04-26 DIAGNOSIS — Z792 Long term (current) use of antibiotics: Secondary | ICD-10-CM | POA: Diagnosis not present

## 2019-04-26 DIAGNOSIS — Z7951 Long term (current) use of inhaled steroids: Secondary | ICD-10-CM | POA: Diagnosis not present

## 2019-04-26 DIAGNOSIS — R2681 Unsteadiness on feet: Secondary | ICD-10-CM | POA: Diagnosis not present

## 2019-04-26 DIAGNOSIS — E119 Type 2 diabetes mellitus without complications: Secondary | ICD-10-CM | POA: Diagnosis not present

## 2019-04-26 DIAGNOSIS — Z7952 Long term (current) use of systemic steroids: Secondary | ICD-10-CM | POA: Diagnosis not present

## 2019-04-26 DIAGNOSIS — Z87440 Personal history of urinary (tract) infections: Secondary | ICD-10-CM | POA: Diagnosis not present

## 2019-04-26 DIAGNOSIS — J189 Pneumonia, unspecified organism: Secondary | ICD-10-CM | POA: Diagnosis not present

## 2019-04-26 DIAGNOSIS — Z87891 Personal history of nicotine dependence: Secondary | ICD-10-CM | POA: Diagnosis not present

## 2019-04-26 DIAGNOSIS — J440 Chronic obstructive pulmonary disease with acute lower respiratory infection: Secondary | ICD-10-CM | POA: Diagnosis not present

## 2019-04-26 DIAGNOSIS — M1991 Primary osteoarthritis, unspecified site: Secondary | ICD-10-CM | POA: Diagnosis not present

## 2019-04-26 DIAGNOSIS — Z9181 History of falling: Secondary | ICD-10-CM | POA: Diagnosis not present

## 2019-04-26 DIAGNOSIS — E039 Hypothyroidism, unspecified: Secondary | ICD-10-CM | POA: Diagnosis not present

## 2019-04-26 DIAGNOSIS — R2689 Other abnormalities of gait and mobility: Secondary | ICD-10-CM | POA: Diagnosis not present

## 2019-04-26 DIAGNOSIS — I110 Hypertensive heart disease with heart failure: Secondary | ICD-10-CM | POA: Diagnosis not present

## 2019-05-01 DIAGNOSIS — M1991 Primary osteoarthritis, unspecified site: Secondary | ICD-10-CM | POA: Diagnosis not present

## 2019-05-01 DIAGNOSIS — Z792 Long term (current) use of antibiotics: Secondary | ICD-10-CM | POA: Diagnosis not present

## 2019-05-01 DIAGNOSIS — J440 Chronic obstructive pulmonary disease with acute lower respiratory infection: Secondary | ICD-10-CM | POA: Diagnosis not present

## 2019-05-01 DIAGNOSIS — R2681 Unsteadiness on feet: Secondary | ICD-10-CM | POA: Diagnosis not present

## 2019-05-01 DIAGNOSIS — I5032 Chronic diastolic (congestive) heart failure: Secondary | ICD-10-CM | POA: Diagnosis not present

## 2019-05-01 DIAGNOSIS — J189 Pneumonia, unspecified organism: Secondary | ICD-10-CM | POA: Diagnosis not present

## 2019-05-01 DIAGNOSIS — Z7952 Long term (current) use of systemic steroids: Secondary | ICD-10-CM | POA: Diagnosis not present

## 2019-05-01 DIAGNOSIS — N390 Urinary tract infection, site not specified: Secondary | ICD-10-CM | POA: Diagnosis not present

## 2019-05-01 DIAGNOSIS — I110 Hypertensive heart disease with heart failure: Secondary | ICD-10-CM | POA: Diagnosis not present

## 2019-05-01 DIAGNOSIS — Z7951 Long term (current) use of inhaled steroids: Secondary | ICD-10-CM | POA: Diagnosis not present

## 2019-05-01 DIAGNOSIS — E039 Hypothyroidism, unspecified: Secondary | ICD-10-CM | POA: Diagnosis not present

## 2019-05-01 DIAGNOSIS — Z9181 History of falling: Secondary | ICD-10-CM | POA: Diagnosis not present

## 2019-05-01 DIAGNOSIS — R2689 Other abnormalities of gait and mobility: Secondary | ICD-10-CM | POA: Diagnosis not present

## 2019-05-01 DIAGNOSIS — E119 Type 2 diabetes mellitus without complications: Secondary | ICD-10-CM | POA: Diagnosis not present

## 2019-05-01 DIAGNOSIS — M069 Rheumatoid arthritis, unspecified: Secondary | ICD-10-CM | POA: Diagnosis not present

## 2019-05-01 DIAGNOSIS — D509 Iron deficiency anemia, unspecified: Secondary | ICD-10-CM | POA: Diagnosis not present

## 2019-05-01 DIAGNOSIS — Z87440 Personal history of urinary (tract) infections: Secondary | ICD-10-CM | POA: Diagnosis not present

## 2019-05-01 DIAGNOSIS — E785 Hyperlipidemia, unspecified: Secondary | ICD-10-CM | POA: Diagnosis not present

## 2019-05-01 DIAGNOSIS — J441 Chronic obstructive pulmonary disease with (acute) exacerbation: Secondary | ICD-10-CM | POA: Diagnosis not present

## 2019-05-01 DIAGNOSIS — Z7984 Long term (current) use of oral hypoglycemic drugs: Secondary | ICD-10-CM | POA: Diagnosis not present

## 2019-05-01 DIAGNOSIS — Z9981 Dependence on supplemental oxygen: Secondary | ICD-10-CM | POA: Diagnosis not present

## 2019-05-01 DIAGNOSIS — Z87891 Personal history of nicotine dependence: Secondary | ICD-10-CM | POA: Diagnosis not present

## 2019-05-07 DIAGNOSIS — Z9981 Dependence on supplemental oxygen: Secondary | ICD-10-CM | POA: Diagnosis not present

## 2019-05-07 DIAGNOSIS — I5032 Chronic diastolic (congestive) heart failure: Secondary | ICD-10-CM | POA: Diagnosis not present

## 2019-05-07 DIAGNOSIS — R2681 Unsteadiness on feet: Secondary | ICD-10-CM | POA: Diagnosis not present

## 2019-05-07 DIAGNOSIS — E785 Hyperlipidemia, unspecified: Secondary | ICD-10-CM | POA: Diagnosis not present

## 2019-05-07 DIAGNOSIS — M1991 Primary osteoarthritis, unspecified site: Secondary | ICD-10-CM | POA: Diagnosis not present

## 2019-05-07 DIAGNOSIS — J440 Chronic obstructive pulmonary disease with acute lower respiratory infection: Secondary | ICD-10-CM | POA: Diagnosis not present

## 2019-05-07 DIAGNOSIS — D509 Iron deficiency anemia, unspecified: Secondary | ICD-10-CM | POA: Diagnosis not present

## 2019-05-07 DIAGNOSIS — Z87891 Personal history of nicotine dependence: Secondary | ICD-10-CM | POA: Diagnosis not present

## 2019-05-07 DIAGNOSIS — Z7984 Long term (current) use of oral hypoglycemic drugs: Secondary | ICD-10-CM | POA: Diagnosis not present

## 2019-05-07 DIAGNOSIS — J441 Chronic obstructive pulmonary disease with (acute) exacerbation: Secondary | ICD-10-CM | POA: Diagnosis not present

## 2019-05-07 DIAGNOSIS — Z87440 Personal history of urinary (tract) infections: Secondary | ICD-10-CM | POA: Diagnosis not present

## 2019-05-07 DIAGNOSIS — R2689 Other abnormalities of gait and mobility: Secondary | ICD-10-CM | POA: Diagnosis not present

## 2019-05-07 DIAGNOSIS — Z792 Long term (current) use of antibiotics: Secondary | ICD-10-CM | POA: Diagnosis not present

## 2019-05-07 DIAGNOSIS — E039 Hypothyroidism, unspecified: Secondary | ICD-10-CM | POA: Diagnosis not present

## 2019-05-07 DIAGNOSIS — N390 Urinary tract infection, site not specified: Secondary | ICD-10-CM | POA: Diagnosis not present

## 2019-05-07 DIAGNOSIS — E119 Type 2 diabetes mellitus without complications: Secondary | ICD-10-CM | POA: Diagnosis not present

## 2019-05-07 DIAGNOSIS — M069 Rheumatoid arthritis, unspecified: Secondary | ICD-10-CM | POA: Diagnosis not present

## 2019-05-07 DIAGNOSIS — Z7951 Long term (current) use of inhaled steroids: Secondary | ICD-10-CM | POA: Diagnosis not present

## 2019-05-07 DIAGNOSIS — J189 Pneumonia, unspecified organism: Secondary | ICD-10-CM | POA: Diagnosis not present

## 2019-05-07 DIAGNOSIS — Z9181 History of falling: Secondary | ICD-10-CM | POA: Diagnosis not present

## 2019-05-07 DIAGNOSIS — I110 Hypertensive heart disease with heart failure: Secondary | ICD-10-CM | POA: Diagnosis not present

## 2019-05-07 DIAGNOSIS — Z7952 Long term (current) use of systemic steroids: Secondary | ICD-10-CM | POA: Diagnosis not present

## 2019-05-09 ENCOUNTER — Other Ambulatory Visit: Payer: Self-pay

## 2019-05-09 ENCOUNTER — Observation Stay (HOSPITAL_COMMUNITY): Payer: Medicare Other

## 2019-05-09 ENCOUNTER — Emergency Department (HOSPITAL_COMMUNITY): Payer: Medicare Other

## 2019-05-09 ENCOUNTER — Inpatient Hospital Stay (HOSPITAL_COMMUNITY)
Admission: EM | Admit: 2019-05-09 | Discharge: 2019-05-12 | DRG: 177 | Disposition: A | Discharge status: Home Health | Payer: Medicare Other | Attending: Internal Medicine | Admitting: Internal Medicine

## 2019-05-09 ENCOUNTER — Encounter (HOSPITAL_COMMUNITY): Payer: Self-pay | Admitting: Internal Medicine

## 2019-05-09 DIAGNOSIS — I5032 Chronic diastolic (congestive) heart failure: Secondary | ICD-10-CM | POA: Diagnosis not present

## 2019-05-09 DIAGNOSIS — J690 Pneumonitis due to inhalation of food and vomit: Secondary | ICD-10-CM | POA: Diagnosis not present

## 2019-05-09 DIAGNOSIS — Z79890 Hormone replacement therapy (postmenopausal): Secondary | ICD-10-CM | POA: Diagnosis not present

## 2019-05-09 DIAGNOSIS — J441 Chronic obstructive pulmonary disease with (acute) exacerbation: Secondary | ICD-10-CM | POA: Diagnosis not present

## 2019-05-09 DIAGNOSIS — E039 Hypothyroidism, unspecified: Secondary | ICD-10-CM | POA: Diagnosis present

## 2019-05-09 DIAGNOSIS — Z7951 Long term (current) use of inhaled steroids: Secondary | ICD-10-CM | POA: Diagnosis not present

## 2019-05-09 DIAGNOSIS — J440 Chronic obstructive pulmonary disease with acute lower respiratory infection: Secondary | ICD-10-CM | POA: Diagnosis not present

## 2019-05-09 DIAGNOSIS — I5030 Unspecified diastolic (congestive) heart failure: Secondary | ICD-10-CM | POA: Diagnosis not present

## 2019-05-09 DIAGNOSIS — M1990 Unspecified osteoarthritis, unspecified site: Secondary | ICD-10-CM | POA: Diagnosis present

## 2019-05-09 DIAGNOSIS — J9601 Acute respiratory failure with hypoxia: Secondary | ICD-10-CM | POA: Diagnosis present

## 2019-05-09 DIAGNOSIS — I110 Hypertensive heart disease with heart failure: Secondary | ICD-10-CM | POA: Diagnosis not present

## 2019-05-09 DIAGNOSIS — M069 Rheumatoid arthritis, unspecified: Secondary | ICD-10-CM | POA: Diagnosis not present

## 2019-05-09 DIAGNOSIS — E876 Hypokalemia: Secondary | ICD-10-CM

## 2019-05-09 DIAGNOSIS — E860 Dehydration: Secondary | ICD-10-CM | POA: Diagnosis not present

## 2019-05-09 DIAGNOSIS — R05 Cough: Secondary | ICD-10-CM | POA: Diagnosis not present

## 2019-05-09 DIAGNOSIS — Z87891 Personal history of nicotine dependence: Secondary | ICD-10-CM

## 2019-05-09 DIAGNOSIS — F209 Schizophrenia, unspecified: Secondary | ICD-10-CM | POA: Diagnosis present

## 2019-05-09 DIAGNOSIS — J189 Pneumonia, unspecified organism: Secondary | ICD-10-CM | POA: Diagnosis not present

## 2019-05-09 DIAGNOSIS — E119 Type 2 diabetes mellitus without complications: Secondary | ICD-10-CM | POA: Diagnosis not present

## 2019-05-09 DIAGNOSIS — J449 Chronic obstructive pulmonary disease, unspecified: Secondary | ICD-10-CM | POA: Diagnosis not present

## 2019-05-09 DIAGNOSIS — R531 Weakness: Secondary | ICD-10-CM

## 2019-05-09 DIAGNOSIS — Z888 Allergy status to other drugs, medicaments and biological substances status: Secondary | ICD-10-CM | POA: Diagnosis not present

## 2019-05-09 DIAGNOSIS — Z20822 Contact with and (suspected) exposure to covid-19: Secondary | ICD-10-CM | POA: Diagnosis not present

## 2019-05-09 DIAGNOSIS — N179 Acute kidney failure, unspecified: Secondary | ICD-10-CM

## 2019-05-09 DIAGNOSIS — R1310 Dysphagia, unspecified: Secondary | ICD-10-CM | POA: Diagnosis not present

## 2019-05-09 DIAGNOSIS — R062 Wheezing: Secondary | ICD-10-CM

## 2019-05-09 DIAGNOSIS — E7800 Pure hypercholesterolemia, unspecified: Secondary | ICD-10-CM | POA: Diagnosis not present

## 2019-05-09 DIAGNOSIS — Z7984 Long term (current) use of oral hypoglycemic drugs: Secondary | ICD-10-CM | POA: Diagnosis not present

## 2019-05-09 DIAGNOSIS — Z743 Need for continuous supervision: Secondary | ICD-10-CM | POA: Diagnosis not present

## 2019-05-09 DIAGNOSIS — Z79899 Other long term (current) drug therapy: Secondary | ICD-10-CM | POA: Diagnosis not present

## 2019-05-09 DIAGNOSIS — R069 Unspecified abnormalities of breathing: Secondary | ICD-10-CM | POA: Diagnosis not present

## 2019-05-09 DIAGNOSIS — R0902 Hypoxemia: Secondary | ICD-10-CM | POA: Diagnosis not present

## 2019-05-09 DIAGNOSIS — R0602 Shortness of breath: Secondary | ICD-10-CM | POA: Diagnosis not present

## 2019-05-09 DIAGNOSIS — R1312 Dysphagia, oropharyngeal phase: Secondary | ICD-10-CM | POA: Diagnosis present

## 2019-05-09 DIAGNOSIS — R000 Tachycardia, unspecified: Secondary | ICD-10-CM | POA: Diagnosis not present

## 2019-05-09 DIAGNOSIS — Z8701 Personal history of pneumonia (recurrent): Secondary | ICD-10-CM | POA: Diagnosis not present

## 2019-05-09 DIAGNOSIS — I959 Hypotension, unspecified: Secondary | ICD-10-CM | POA: Diagnosis not present

## 2019-05-09 DIAGNOSIS — J9621 Acute and chronic respiratory failure with hypoxia: Secondary | ICD-10-CM | POA: Diagnosis present

## 2019-05-09 DIAGNOSIS — R918 Other nonspecific abnormal finding of lung field: Secondary | ICD-10-CM | POA: Diagnosis not present

## 2019-05-09 LAB — POCT I-STAT 7, (LYTES, BLD GAS, ICA,H+H)
Acid-Base Excess: 7 mmol/L — ABNORMAL HIGH (ref 0.0–2.0)
Bicarbonate: 32.7 mmol/L — ABNORMAL HIGH (ref 20.0–28.0)
Calcium, Ion: 1.12 mmol/L — ABNORMAL LOW (ref 1.15–1.40)
HCT: 29 % — ABNORMAL LOW (ref 36.0–46.0)
Hemoglobin: 9.9 g/dL — ABNORMAL LOW (ref 12.0–15.0)
O2 Saturation: 99 %
Patient temperature: 98.7
Potassium: 2.8 mmol/L — ABNORMAL LOW (ref 3.5–5.1)
Sodium: 138 mmol/L (ref 135–145)
TCO2: 34 mmol/L — ABNORMAL HIGH (ref 22–32)
pCO2 arterial: 49.6 mmHg — ABNORMAL HIGH (ref 32.0–48.0)
pH, Arterial: 7.427 (ref 7.350–7.450)
pO2, Arterial: 146 mmHg — ABNORMAL HIGH (ref 83.0–108.0)

## 2019-05-09 LAB — CBC WITH DIFFERENTIAL/PLATELET
Abs Immature Granulocytes: 0.02 10*3/uL (ref 0.00–0.07)
Basophils Absolute: 0 10*3/uL (ref 0.0–0.1)
Basophils Relative: 0 %
Eosinophils Absolute: 0.1 10*3/uL (ref 0.0–0.5)
Eosinophils Relative: 1 %
HCT: 35.9 % — ABNORMAL LOW (ref 36.0–46.0)
Hemoglobin: 11.2 g/dL — ABNORMAL LOW (ref 12.0–15.0)
Immature Granulocytes: 0 %
Lymphocytes Relative: 10 %
Lymphs Abs: 0.9 10*3/uL (ref 0.7–4.0)
MCH: 29.7 pg (ref 26.0–34.0)
MCHC: 31.2 g/dL (ref 30.0–36.0)
MCV: 95.2 fL (ref 80.0–100.0)
Monocytes Absolute: 0.7 10*3/uL (ref 0.1–1.0)
Monocytes Relative: 7 %
Neutro Abs: 8 10*3/uL — ABNORMAL HIGH (ref 1.7–7.7)
Neutrophils Relative %: 82 %
Platelets: 285 10*3/uL (ref 150–400)
RBC: 3.77 MIL/uL — ABNORMAL LOW (ref 3.87–5.11)
RDW: 20.2 % — ABNORMAL HIGH (ref 11.5–15.5)
WBC: 9.8 10*3/uL (ref 4.0–10.5)
nRBC: 0 % (ref 0.0–0.2)

## 2019-05-09 LAB — RESPIRATORY PANEL BY PCR

## 2019-05-09 LAB — BASIC METABOLIC PANEL
Anion gap: 14 (ref 5–15)
BUN: 16 mg/dL (ref 8–23)
CO2: 30 mmol/L (ref 22–32)
Calcium: 8.5 mg/dL — ABNORMAL LOW (ref 8.9–10.3)
Chloride: 94 mmol/L — ABNORMAL LOW (ref 98–111)
Creatinine, Ser: 1.51 mg/dL — ABNORMAL HIGH (ref 0.44–1.00)
GFR calc Af Amer: 39 mL/min — ABNORMAL LOW (ref >60)
GFR calc non Af Amer: 33 mL/min — ABNORMAL LOW (ref >60)
Glucose, Bld: 106 mg/dL — ABNORMAL HIGH (ref 70–99)
Potassium: 3.2 mmol/L — ABNORMAL LOW (ref 3.5–5.1)
Sodium: 138 mmol/L (ref 135–145)

## 2019-05-09 LAB — BRAIN NATRIURETIC PEPTIDE: B Natriuretic Peptide: 58.8 pg/mL (ref 0.0–100.0)

## 2019-05-09 LAB — TROPONIN I (HIGH SENSITIVITY)
Troponin I (High Sensitivity): 7 ng/L (ref <18)
Troponin I (High Sensitivity): 7 ng/L (ref <18)

## 2019-05-09 LAB — MAGNESIUM: Magnesium: 1.2 mg/dL — ABNORMAL LOW (ref 1.7–2.4)

## 2019-05-09 LAB — URINALYSIS, ROUTINE W REFLEX MICROSCOPIC
Bilirubin Urine: NEGATIVE
Glucose, UA: NEGATIVE mg/dL
Hgb urine dipstick: NEGATIVE
Ketones, ur: NEGATIVE mg/dL
Leukocytes,Ua: NEGATIVE
Nitrite: NEGATIVE
Protein, ur: NEGATIVE mg/dL
Specific Gravity, Urine: 1.014 (ref 1.005–1.030)
pH: 5 (ref 5.0–8.0)

## 2019-05-09 LAB — CBG MONITORING, ED: Glucose-Capillary: 84 mg/dL (ref 70–99)

## 2019-05-09 LAB — SARS CORONAVIRUS 2 (TAT 6-24 HRS): SARS Coronavirus 2: NEGATIVE

## 2019-05-09 LAB — STREP PNEUMONIAE URINARY ANTIGEN: Strep Pneumo Urinary Antigen: NEGATIVE

## 2019-05-09 LAB — LACTIC ACID, PLASMA: Lactic Acid, Venous: 1.9 mmol/L (ref 0.5–1.9)

## 2019-05-09 LAB — POC SARS CORONAVIRUS 2 AG -  ED: SARS Coronavirus 2 Ag: NEGATIVE

## 2019-05-09 MED ORDER — SODIUM CHLORIDE 0.9 % IV BOLUS
500.0000 mL | Freq: Once | INTRAVENOUS | Status: AC
  Administered 2019-05-09: 500 mL via INTRAVENOUS

## 2019-05-09 MED ORDER — SODIUM CHLORIDE 0.9 % IV SOLN
2.0000 g | Freq: Two times a day (BID) | INTRAVENOUS | Status: DC
  Administered 2019-05-09: 2 g via INTRAVENOUS
  Filled 2019-05-09 (×2): qty 2

## 2019-05-09 MED ORDER — ENOXAPARIN SODIUM 30 MG/0.3ML ~~LOC~~ SOLN: 30.0000 mg | SUBCUTANEOUS | Status: DC

## 2019-05-09 MED ORDER — VANCOMYCIN HCL 1500 MG/300ML IV SOLN
1500.0000 mg | Freq: Once | INTRAVENOUS | Status: AC
  Administered 2019-05-09: 12:00:00 1500 mg via INTRAVENOUS
  Filled 2019-05-09: qty 300

## 2019-05-09 MED ORDER — SODIUM CHLORIDE 0.9 % IV SOLN
2.0000 g | Freq: Once | INTRAVENOUS | Status: AC
  Administered 2019-05-09: 11:00:00 2 g via INTRAVENOUS
  Filled 2019-05-09: qty 2

## 2019-05-09 MED ORDER — IPRATROPIUM-ALBUTEROL 0.5-2.5 (3) MG/3ML IN SOLN
3.0000 mL | Freq: Four times a day (QID) | RESPIRATORY_TRACT | Status: DC
  Administered 2019-05-09: 22:00:00 3 mL via RESPIRATORY_TRACT
  Filled 2019-05-09 (×2): qty 3

## 2019-05-09 MED ORDER — FLUTICASONE FUROATE-VILANTEROL 100-25 MCG/INH IN AEPB
1.0000 | INHALATION_SPRAY | Freq: Every day | RESPIRATORY_TRACT | Status: DC
  Administered 2019-05-10 – 2019-05-12 (×3): 1 via RESPIRATORY_TRACT
  Filled 2019-05-09: qty 28

## 2019-05-09 MED ORDER — FLUTICASONE-UMECLIDIN-VILANT 100-62.5-25 MCG/INH IN AEPB: 1.0000 | INHALATION_SPRAY | Freq: Every day | RESPIRATORY_TRACT | Status: DC

## 2019-05-09 MED ORDER — UMECLIDINIUM BROMIDE 62.5 MCG/INH IN AEPB
1.0000 | INHALATION_SPRAY | Freq: Every day | RESPIRATORY_TRACT | Status: DC
  Administered 2019-05-10 – 2019-05-12 (×3): 1 via RESPIRATORY_TRACT
  Filled 2019-05-09: qty 7

## 2019-05-09 MED ORDER — IPRATROPIUM-ALBUTEROL 0.5-2.5 (3) MG/3ML IN SOLN
3.0000 mL | Freq: Three times a day (TID) | RESPIRATORY_TRACT | Status: DC
  Administered 2019-05-10: 08:00:00 3 mL via RESPIRATORY_TRACT
  Filled 2019-05-09: qty 3

## 2019-05-09 MED ORDER — LEVOTHYROXINE SODIUM 75 MCG PO TABS
75.0000 ug | ORAL_TABLET | Freq: Every day | ORAL | Status: DC
  Administered 2019-05-10 – 2019-05-12 (×3): 75 ug via ORAL
  Filled 2019-05-09 (×3): qty 1

## 2019-05-09 MED ORDER — POTASSIUM CHLORIDE CRYS ER 20 MEQ PO TBCR
40.0000 meq | EXTENDED_RELEASE_TABLET | Freq: Once | ORAL | Status: AC
  Administered 2019-05-09: 10:00:00 40 meq via ORAL
  Filled 2019-05-09: qty 2

## 2019-05-09 MED ORDER — SODIUM CHLORIDE 0.9 % IV SOLN: 500.0000 mg | Freq: Once | INTRAVENOUS | Status: DC

## 2019-05-09 MED ORDER — VANCOMYCIN HCL IN DEXTROSE 1-5 GM/200ML-% IV SOLN: 1000.0000 mg | INTRAVENOUS | Status: DC

## 2019-05-09 MED ORDER — ALBUTEROL SULFATE HFA 108 (90 BASE) MCG/ACT IN AERS
8.0000 | INHALATION_SPRAY | Freq: Once | RESPIRATORY_TRACT | Status: AC
  Administered 2019-05-09: 07:00:00 8 via RESPIRATORY_TRACT
  Filled 2019-05-09: qty 6.7

## 2019-05-09 MED ORDER — ZIPRASIDONE HCL 40 MG PO CAPS
40.0000 mg | ORAL_CAPSULE | Freq: Every day | ORAL | Status: DC
  Administered 2019-05-09 – 2019-05-11 (×3): 40 mg via ORAL
  Filled 2019-05-09 (×4): qty 1

## 2019-05-09 MED ORDER — IPRATROPIUM-ALBUTEROL 0.5-2.5 (3) MG/3ML IN SOLN: 3.0000 mL | Freq: Four times a day (QID) | RESPIRATORY_TRACT | Status: DC | PRN

## 2019-05-09 MED ORDER — IPRATROPIUM BROMIDE HFA 17 MCG/ACT IN AERS
2.0000 | INHALATION_SPRAY | Freq: Once | RESPIRATORY_TRACT | Status: AC
  Administered 2019-05-09: 07:00:00 2 via RESPIRATORY_TRACT
  Filled 2019-05-09: qty 12.9

## 2019-05-09 MED ORDER — SODIUM CHLORIDE 0.9 % IV BOLUS
500.0000 mL | Freq: Once | INTRAVENOUS | Status: AC
  Administered 2019-05-09: 07:00:00 500 mL via INTRAVENOUS

## 2019-05-09 MED ORDER — SIMVASTATIN 20 MG PO TABS
40.0000 mg | ORAL_TABLET | Freq: Every day | ORAL | Status: DC
  Administered 2019-05-10 – 2019-05-11 (×2): 40 mg via ORAL
  Filled 2019-05-09 (×2): qty 2

## 2019-05-09 MED ORDER — SODIUM CHLORIDE 0.9 % IV SOLN: 1.0000 g | Freq: Once | INTRAVENOUS | Status: DC

## 2019-05-09 MED ORDER — VENLAFAXINE HCL ER 150 MG PO CP24: 150.0000 mg | ORAL_CAPSULE | Freq: Every morning | ORAL | Status: DC

## 2019-05-09 NOTE — ED Provider Notes (Signed)
Medical screening examination/treatment/procedure(s) were conducted as a shared visit with non-physician practitioner(s) and myself.  I personally evaluated the patient during the encounter.  EKG Interpretation  Date/Time:  Wednesday May 09 2019 06:23:00 EST Ventricular Rate:  105 PR Interval:    QRS Duration: 59 QT Interval:  390 QTC Calculation: 516 R Axis:   36 Text Interpretation: Sinus tachycardia Anteroseptal infarct, old Nonspecific T abnormalities, inferior leads Prolonged QT interval No significant change since last tracing other than rate is faster Confirmed by Rochele Raring [phone]) on 05/09/2019 6:26:44 AM   Patient is a 74 year old female who presents to the emergency department shortness of breath, cough that she states started tonight.  No chest discomfort.  Patient is a very poor historian.  Documented history of COPD.  Was hypoxic at home per EMS but sats 95 to 96% on room air on my evaluation.  Diminished aeration with inspiratory and expiratory wheezing that improved with albuterol inhaler.  Admitted in November for multifocal pneumonia, hypotension.  Does have history of grade 1 diastolic dysfunction.  Labs, chest x-ray pending.  Will obtain cultures, rectal temp, lactate and give IV hydration.  Currently no other sign of sepsis.  Patient may require admission.   Millard Bautch, Layla Maw, DO 05/09/19 [phone]

## 2019-05-09 NOTE — ED Notes (Signed)
Respiratory aware ABG is ordered.

## 2019-05-09 NOTE — ED Notes (Signed)
Phil ([facility]/transfer to Oncology  Rad.)called @ 1326-per Alycia Rossetti, RN called by Marylene Land

## 2019-05-09 NOTE — ED Triage Notes (Signed)
Pt arrives via [facility] from home where the pt daughter called for worsening SOB and a productive cough that has been getting worse for the past 2 days. Pt O2 saturation was 86% at home on room air and pt was placed on 3L [place] and has been 100% for EMS. Pt 95% on room air on arrival.Pt daughter stated to EMS that the pt has had pneumonia recently.

## 2019-05-09 NOTE — ED Provider Notes (Signed)
[facility] EMERGENCY DEPARTMENT Provider Note   CSN: [id number] Arrival date & time: 05/09/19  0932     History Chief Complaint  Patient presents with   Cough   Shortness of Breath    Karla Clark is a 74 y.o. female.  Patient with history of COPD, DM, sepsis, recurrent/frequent aspiration PNA -- presents to the emergency department with shortness of breath starting last night.  EMS was called patient was found to be hypoxic to 86%.  Patient improved on supplemental O2.  Patient denies any fevers.  She has had associated cough.  She does not think that she has been around anybody has been sick.  No nausea, vomiting, diarrhea.  No other treatments prior to arrival. The onset of this condition was acute. The course is constant. Aggravating factors: none. Alleviating factors: none.   Additional history obtained by the patient's daughter.  She reports decreased oral intake, very little fluid intake yesterday as well as decreased urination.  She does not think that the patient has urinated since yesterday afternoon.  She reports that the patient was having a very persistent cough and worsening wheezing over the past 2 days but much worse last night.  She had been improving with home breathing treatments.  She has been increasingly weak and unsteady on her feet.  Typically she is able to transfer from the bed to her recliner to the toilet at bedside.  If she needs to walk more than a few steps, she is in a wheelchair.  She was very unsteady when home health nurse helped her shower yesterday.         Past Medical History:  Diagnosis Date   Anxiety    COPD (chronic obstructive pulmonary disease) (HCC)    Diabetes mellitus without complication (HCC)    Elevated cholesterol    Hypertension    Hypothyroidism    Osteoarthritis    RA (rheumatoid arthritis) (HCC)    Schizophrenia (HCC)    delusional    Patient Active Problem List   Diagnosis Date Noted    Sepsis due to pneumonia (HCC) 03/07/2019   Acute lower UTI 03/07/2019   Acute on chronic respiratory failure with hypoxia (HCC) 03/07/2019   COPD with acute exacerbation (HCC) 03/07/2019   Acute metabolic encephalopathy 03/07/2019   Transient hypotension 03/07/2019   AKI (acute kidney injury) (HCC) 03/07/2019   Rheumatoid arthritis with rheumatoid factor of multiple sites without organ or systems involvement (HCC) 07/19/2016   High risk medications (not anticoagulants) long-term use 07/19/2016   Osteoarthritis of both knees 07/19/2016   Osteoarthritis of both hands 07/19/2016   Primary osteoarthritis of both feet 07/19/2016    Past Surgical History:  Procedure Laterality Date   ABDOMINAL HYSTERECTOMY       OB History   No obstetric history on file.     No family history on file.  Social History   Tobacco Use   Smoking status: Former Smoker    Quit date: 03/11/1986    Years since quitting: 33.1   Smokeless tobacco: Never Used  Substance Use Topics   Alcohol use: No   Drug use: No    Home Medications Prior to Admission medications   Medication Sig Start Date End Date Taking? Authorizing Provider  albuterol (PROVENTIL) (2.5 MG/3ML) 0.083% nebulizer solution Take 2.5 mg by nebulization every 6 (six) hours as needed for wheezing or shortness of breath.    [provider]  ferrous sulfate 325 (65 FE) MG tablet Take  1 tablet (325 mg total) by mouth daily. 03/12/19 04/11/19  Burnadette Pop, MD  Fluticasone-Umeclidin-Vilant (TRELEGY ELLIPTA) 100-62.5-25 MCG/INH AEPB Inhale 1 puff into the lungs daily. 12/05/17   [provider]  folic acid (FOLVITE) 1 MG tablet TAKE 1 TABLET BY MOUTH EVERY DAY Patient not taking: Reported on 03/07/2019 03/30/17   Pollyann Savoy, MD  furosemide (LASIX) 40 MG tablet Take 1 tablet (40 mg total) by mouth daily. 03/12/19 03/11/20  Burnadette Pop, MD  gabapentin (NEURONTIN) 600 MG tablet Take 1,200 mg by mouth 2  (two) times daily. 03/02/19   [provider]  ipratropium-albuterol (DUONEB) 0.5-2.5 (3) MG/3ML SOLN Take 3 mLs by nebulization every 6 (six) hours as needed (shortness of breath).    [provider]  levothyroxine (SYNTHROID) 75 MCG tablet Take 75 mcg by mouth daily. 01/21/19   [provider]  losartan (COZAAR) 100 MG tablet Take 100 mg by mouth daily. 02/16/19   [provider]  metFORMIN (GLUCOPHAGE-XR) 500 MG 24 hr tablet Take 500 mg by mouth 2 (two) times daily.  07/05/16   [provider]  NYSTATIN powder Apply 1 g topically 2 (two) times daily as needed (rash).  02/05/16   [provider]  pantoprazole (PROTONIX) 40 MG tablet Take 40 mg by mouth daily. 12/28/18   [provider]  pioglitazone (ACTOS) 30 MG tablet Take 30 mg by mouth daily.  01/27/16   [provider]  potassium chloride 20 MEQ TBCR Take 20 mEq by mouth daily. 03/12/19   Burnadette Pop, MD  simvastatin (ZOCOR) 40 MG tablet Take 40 mg by mouth daily at 6 PM.  01/12/16   [provider]  venlafaxine XR (EFFEXOR-XR) 150 MG 24 hr capsule Take 150 mg by mouth every morning. 02/15/19   [provider]  ziprasidone (GEODON) 40 MG capsule Take 40 mg by mouth at bedtime.  02/26/19   [provider]    Allergies    Ace inhibitors  Review of Systems   Review of Systems  Constitutional: Positive for appetite change. Negative for fever.  HENT: Negative for rhinorrhea and sore throat.   Eyes: Negative for redness.  Respiratory: Positive for cough, shortness of breath and wheezing.   Cardiovascular: Negative for chest pain.  Gastrointestinal: Negative for abdominal pain, diarrhea, nausea and vomiting.  Genitourinary: Positive for decreased urine volume. Negative for dysuria and hematuria.  Musculoskeletal: Negative for myalgias.  Skin: Negative for rash.  Neurological: Positive for weakness (Generalized). Negative for headaches.     Physical Exam Updated Vital Signs BP (!) 97/49    Pulse (!) 106    Temp 97.8 F (36.6 C) (Oral)    Resp 17    SpO2 96%   Physical Exam Vitals and nursing note reviewed.  Constitutional:      Appearance: She is well-developed.  HENT:     Head: Normocephalic and atraumatic.     Mouth/Throat:     Mouth: Mucous membranes are dry.  Eyes:     General:        Right eye: No discharge.        Left eye: No discharge.     Conjunctiva/sclera: Conjunctivae normal.  Cardiovascular:     Rate and Rhythm: Normal rate and regular rhythm.     Heart sounds: Normal heart sounds.  Pulmonary:     Effort: Pulmonary effort is normal.     Breath sounds: Decreased breath sounds and wheezing present.     Comments: No distress.  Abdominal:     Palpations: Abdomen is soft.     Tenderness: There is no abdominal tenderness.  Musculoskeletal:     Cervical back: Normal range of motion and neck supple.     Right lower leg: No tenderness.     Left lower leg: No tenderness. No edema.  Skin:    General: Skin is warm and dry.  Neurological:     General: No focal deficit present.     Mental Status: She is alert.     Comments: Patient moves all extremities but is generally weak.  No focal weakness.  She has mumbled speech but answers questions.     ED Results / Procedures / Treatments   Labs (all labs ordered are listed, but only abnormal results are displayed) Labs Reviewed  CBC WITH DIFFERENTIAL/PLATELET - Abnormal; Notable for the following components:      Result Value   RBC 3.77 (*)    Hemoglobin 11.2 (*)    HCT 35.9 (*)    RDW 20.2 (*)    Neutro Abs 8.0 (*)    All other components within normal limits  BASIC METABOLIC PANEL - Abnormal; Notable for the following components:   Potassium 3.2 (*)    Chloride 94 (*)    Glucose, Bld 106 (*)    Creatinine, Ser 1.51 (*)    Calcium 8.5 (*)    GFR calc non Af Amer 33 (*)    GFR calc Af Amer 39 (*)    All other components within normal limits   URINALYSIS, ROUTINE W REFLEX MICROSCOPIC - Abnormal; Notable for the following components:   APPearance HAZY (*)    All other components within normal limits  POCT I-STAT 7, (LYTES, BLD GAS, ICA,H+H) - Abnormal; Notable for the following components:   pCO2 arterial 49.6 (*)    pO2, Arterial 146.0 (*)    Bicarbonate 32.7 (*)    TCO2 34 (*)    Acid-Base Excess 7.0 (*)    Potassium 2.8 (*)    Calcium, Ion 1.12 (*)    HCT 29.0 (*)    Hemoglobin 9.9 (*)    All other components within normal limits  CULTURE, BLOOD (ROUTINE X 2)  CULTURE, BLOOD (ROUTINE X 2)  URINE CULTURE  SARS CORONAVIRUS 2 (TAT 6-24 HRS)  BRAIN NATRIURETIC PEPTIDE  LACTIC ACID, PLASMA  CBG MONITORING, ED  POC SARS CORONAVIRUS 2 AG -  ED  I-STAT ARTERIAL BLOOD GAS, ED  TROPONIN I (HIGH SENSITIVITY)  TROPONIN I (HIGH SENSITIVITY)    EKG EKG Interpretation  Date/Time:  Wednesday May 09 2019 06:23:00 EST Ventricular Rate:  105 PR Interval:    QRS Duration: 59 QT Interval:  390 QTC Calculation: 516 R Axis:   36 Text Interpretation: Sinus tachycardia Anteroseptal infarct, old Nonspecific T abnormalities, inferior leads Prolonged QT interval No significant change since last tracing other than rate is faster Confirmed by Rochele Raring [phone]) on 05/09/2019 6:26:44 AM   Radiology DG Chest Portable 1 View  Result Date: 05/09/2019 CLINICAL DATA:  Shortness of breath and cough. EXAM: PORTABLE CHEST 1 VIEW COMPARISON:  03/11/2019 FINDINGS: The cardiomediastinal silhouette is unchanged. Lung volumes are lower than on the prior study. There is improved right lung aeration with resolution of right midlung opacities and only minimal opacity currently present in the right lung base. There is mild left basilar opacity which is greater than on the prior study, and there are likely small left and possibly small residual right pleural effusions. No pneumothorax  is identified. There is mild thoracolumbar dextroscoliosis.  IMPRESSION: 1. Improved right lung aeration with minimal right basilar opacity currently, likely atelectasis. 2. Increased left basilar opacity which may represent atelectasis or infection. 3. Suspected small pleural effusions. Electronically Signed   By: Logan Bores M.D.   On: 05/09/2019 07:40    Procedures Procedures (including critical care time)  Medications Ordered in ED Medications  vancomycin (VANCOREADY) IVPB 1500 mg/300 mL (has no administration in time range)  vancomycin (VANCOCIN) IVPB 1000 mg/200 mL premix (has no administration in time range)  albuterol (VENTOLIN HFA) 108 (90 Base) MCG/ACT inhaler 8 puff (8 puffs Inhalation Given 05/09/19 0631)  ipratropium (ATROVENT HFA) inhaler 2 puff (2 puffs Inhalation Given 05/09/19 0643)  sodium chloride 0.9 % bolus 500 mL (0 mLs Intravenous Stopped 05/09/19 0918)  potassium chloride SA (KLOR-CON) CR tablet 40 mEq (40 mEq Oral Given 05/09/19 0935)  sodium chloride 0.9 % bolus 500 mL (0 mLs Intravenous Stopped 05/09/19 1053)  ceFEPIme (MAXIPIME) 2 g in sodium chloride 0.9 % 100 mL IVPB (0 g Intravenous Stopped 05/09/19 1151)    ED Course  I have reviewed the triage vital signs and the nursing notes.  Pertinent labs & imaging results that were available during my care of the patient were reviewed by me and considered in my medical decision making (see chart for details).    MDM Rules/Calculators/A&P                      Patient seen and examined. Work-up initiated. Medications ordered.   Vital signs reviewed and are as follows: BP (!) 93/53    Pulse 99    Temp 97.8 F (36.6 C) (Oral)    Resp 17    SpO2 94%   6:53 AM Pt seen by and discussed with Dr. Leonides Schanz.   9:10 AM Patient is resting comfortably in bed.  I was able to get a hold of the patient's daughter.  She has had increasing wheezing and coughing over the past 1 to 2 days.  Coughing was persistent last night prompting emergency department visit today.  Symptoms have been improved at home  with albuterol treatments.  Will ambulate to determine final disposition.  9:42 AM I spoke with the patient's daughter again.  Patient is unable to even sit up in bed.  She has episodes where her oxygen saturation will drop into the 70s and 80s but then quickly recover.  Unclear if this is accurate or not.  Patient will need to be admitted to the hospital.  Given chest x-ray findings, will cover with antibiotics.  Will check ABG.  She has very little urine output with cath.  I suspect that she may be over diuresed as well.  Hbg was 7's-8's, in 11's today.  Additional fluid bolus ordered.  12:42 PM Spoke earlier with IMTS who will see patient. I just checked on patient. RN's at bedside. Pt awake and alert. No distress.    Final Clinical Impression(s) / ED Diagnoses Final diagnoses:  Shortness of breath  COPD exacerbation (HCC)  Hypotension, unspecified hypotension type  Acute kidney injury ([place])  Hypokalemia  Generalized weakness   Admit for generalized weakness, likely multifactorial, patient typically transfers however cannot transfer now.  Patient is on a diuretic and I suspect that she is somewhat dehydrated.  She has an AKI and her creatinine is doubled.  Also hypokalemic.  Hemoglobin was in 7-8 range 2 months ago now on 11's and she appears dry  on exam.  Decreased oral intake and urinary output per history from daughter.  Hydrating gently, 1 L total.  Patient also with increasing cough, history of recurrent aspiration pneumonia.  Overall her chest x-ray appears improved today.  However, question left basilar infiltrate.  Could also be related to COPD exacerbation.    Rx / DC Orders ED Discharge Orders    None       Renne Crigler, PA-C 05/09/19 1242    Ward, Layla Maw, DO 05/09/19 2319

## 2019-05-09 NOTE — Evaluation (Signed)
Physical Therapy Evaluation Patient Details Name: Karla Clark MRN: [id number] DOB: 1945/12/18 Today's Date: 05/09/2019   History of Present Illness  Patient is a 74 y/o female admitted with increased cough, weakness and SOB due to PNA.  PMH positive for diabetes, COPD, HTN, hypothyroidism, anxiety, OA and RA. Here in 03/2019 with PNA, resp failure, UTI.  Clinical Impression  Patient presents with mobility limited due to decreased strength/activity tolerance.  Currently mod A for supine to sit and unsafe to attempt OOB due to high stretcher in ED.  Feel she is likely mildly weaker than baseline due to acute illness and will benefit from skilled PT to aide in progressing mobility to allow return home with family and aide support.  May also benefit from HHPT at d/c.     Follow Up Recommendations Home health PT    Equipment Recommendations  None recommended by PT    Recommendations for Other Services       Precautions / Restrictions Precautions Precautions: Fall Precaution Comments: on O2 at home      Mobility  Bed Mobility Overal bed mobility: Needs Assistance Bed Mobility: Supine to Sit;Sit to Supine     Supine to sit: Mod assist;HOB elevated Sit to supine: Min assist   General bed mobility comments: Attempting to eat lying down with HOB up, but c/o difficulty, assisted up to EOB with moving legs and lifting trunk, pt able to lift legs onto bed and needing assist for positioning once supine  Transfers                 General transfer comment: NT, pt on high stretcher with decreased LE strength, did not attempt OOB today  Ambulation/Gait                Stairs            Wheelchair Mobility    Modified Rankin (Stroke Patients Only)       Balance Overall balance assessment: Needs assistance Sitting-balance support: Feet unsupported Sitting balance-Leahy Scale: Fair Sitting balance - Comments: seated EOB on stretcher with feet dangling to eat  lunch with R lateral lean and some intermittent leaning on L elbow                                     Pertinent Vitals/Pain Pain Assessment: Faces Faces Pain Scale: Hurts little more Pain Location: thighs in sitting Pain Descriptors / Indicators: Aching;Grimacing Pain Intervention(s): Monitored during session;Repositioned;Utilized relaxation techniques    Home Living Family/patient expects to be discharged to:: Private residence Living Arrangements: Children;Non-relatives/Friends Available Help at Discharge: Available PRN/intermittently;Family Type of Home: House Home Access: Ramped entrance     Home Layout: One level Home Equipment: [place] - 2 wheels;Bedside commode;Wheelchair - manual;Hand held shower head      Prior Function Level of Independence: Needs assistance   Gait / Transfers Assistance Needed: reports using w/c but unable to propel on her own  ADL's / Homemaking Assistance Needed: needs assist with bathing and dressing as well as iADLs  Comments: Pt reports she has an aide who stay with her when her daughter is not able to be at home.      Hand Dominance        Extremity/Trunk Assessment   Upper Extremity Assessment Upper Extremity Assessment: LUE deficits/detail;RUE deficits/detail RUE Deficits / Details: 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 able to use spoon on R and hook a  cup with L, but uses compensatory techniques LUE Deficits / Details: 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 able to use spoon on R and hook a cup with L, but uses compensatory techniques    Lower Extremity Assessment Lower Extremity Assessment: Generalized weakness;RLE deficits/detail;LLE deficits/detail RLE Deficits / Details: Deformities in foot/toes due to arthritis, needs assist to move legs off bed, but able to lift them onto bed LLE Deficits / Details: Deformities in foot/toes due to arthritis, needs assist to move legs off bed, but able to lift them onto bed    Cervical / Trunk  Assessment Cervical / Trunk Assessment: Kyphotic  Communication   Communication: No difficulties  Cognition Arousal/Alertness: Awake/alert Behavior During Therapy: Flat affect Overall Cognitive Status: No family/caregiver present to determine baseline cognitive functioning                                 General Comments: slow to respond to questions/commands, but HOH as well, not formally tested      General Comments General comments (skin integrity, edema, etc.): eating spaghetti with a spoon wtih 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, h/o recurrent PNA, encouraged nursing staff to assist for eating in upright position    Exercises     Assessment/Plan    PT Assessment Patient needs continued PT services  PT Problem List Decreased strength;Decreased activity tolerance;Decreased mobility;Decreased safety awareness       PT Treatment Interventions DME instruction;Functional mobility training;Therapeutic exercise;Patient/family education;Balance training;Therapeutic activities    PT Goals (Current goals can be found in the Care Plan section)  Acute Rehab PT Goals Patient Stated Goal: to return home PT Goal Formulation: With patient Time For Goal Achievement: 05/23/19 Potential to Achieve Goals: Fair    Frequency Min 3X/week   Barriers to discharge        Co-evaluation               AM-PAC PT "6 Clicks" Mobility  Outcome Measure Help needed turning from your back to your side while in a flat bed without using bedrails?: A Little Help needed moving from lying on your back to sitting on the side of a flat bed without using bedrails?: A Lot Help needed moving to and from a bed to a chair (including a wheelchair)?: A Lot Help needed standing up from a chair using your arms (e.g., wheelchair or bedside chair)?: A Lot Help needed to walk in hospital room?: Total Help needed climbing 3-5 steps with a railing? : Total 6 Click Score: 11    End of Session Equipment  Utilized During Treatment: Oxygen Activity Tolerance: Patient tolerated treatment well Patient left: in bed;with call bell/phone within reach   PT Visit Diagnosis: Other abnormalities of gait and mobility (R26.89);Muscle weakness (generalized) (M62.81)    Time: 1510-1536 PT Time Calculation (min) (ACUTE ONLY): 26 min   Charges:   PT Evaluation $PT Eval Moderate Complexity: 1 Mod PT Treatments $Therapeutic Activity: 8-22 mins        Sheran Lawless, [facility] [phone] 05/09/2019   Elray Mcgregor 05/09/2019, 5:45 PM

## 2019-05-09 NOTE — ED Notes (Signed)
ED TO INPATIENT HANDOFF REPORT  ED Nurse Name and Phone #: Thurmond Butts [place] Name/Age/Gender Karla Clark 74 y.o. female Room/Bed: 007C/007C  Code Status   Code Status: Full Code  Home/SNF/Other Home Patient oriented to: self, place, time and situation Is this baseline? Yes   Triage Complete: Triage complete  Chief Complaint AKI (acute kidney injury) ([place]) [N17.9]  Triage Note Pt arrives via [facility] from home where the pt daughter called for worsening SOB and a productive cough that has been getting worse for the past 2 days. Pt O2 saturation was 86% at home on room air and pt was placed on 3L [place] and has been 100% for EMS. Pt 95% on room air on arrival.Pt daughter stated to EMS that the pt has had pneumonia recently.     Allergies Allergies  Allergen Reactions  . Ace Inhibitors Cough    Level of Care/Admitting Diagnosis ED Disposition    ED Disposition Condition Comment   Admit  Hospital Area: [place] [[id number]]  Level of Care: Telemetry Medical [104]  Diagnosis: AKI (acute kidney injury) [facility]) [[id number]]  Admitting Physician: Aldine Contes [phone]  Attending Physician: Aldine Contes [[id number]]  PT Class (Do Not Modify): Observation [104]  PT Acc Code (Do Not Modify): Observation [10022]       B Medical/Surgery History Past Medical History:  Diagnosis Date  . Anxiety   . COPD (chronic obstructive pulmonary disease) ([place])   . Diabetes mellitus without complication ([place])   . Elevated cholesterol   . Hypertension   . Hypothyroidism   . Osteoarthritis   . RA (rheumatoid arthritis) ([place])   . 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 ABDOMINAL HYSTERECTOMY       A IV Location/Drains/Wounds Patient Lines/Drains/Airways Status   Active Line/Drains/Airways    Name:   Placement date:   Placement time:   Site:   Days:   Peripheral IV 03/08/19 Right;Anterior Forearm    03/08/19    1804    Forearm   62   Peripheral IV 05/09/19 Right Antecubital   05/09/19    0649    Antecubital   less than 1   External Urinary Catheter   03/09/19    0700    --   61          Intake/Output Last 24 hours  Intake/Output Summary (Last 24 hours) at 05/09/2019 1915 Last data filed at 05/09/2019 1453 Gross per 24 hour  Intake 300 ml  Output --  Net 300 ml    Labs/Imaging Results for orders placed or performed during the hospital encounter of 05/09/19 (from the past 48 hour(s))  CBG monitoring, ED     Status: None   Collection Time: 05/09/19  6:26 AM  Result Value Ref Range   Glucose-Capillary 84 70 - 99 mg/dL  CBC with Differential     Status: Abnormal   Collection Time: 05/09/19  6:38 AM  Result Value Ref Range   WBC 9.8 4.0 - 10.5 K/uL   RBC 3.77 (L) 3.87 - 5.11 MIL/uL   Hemoglobin 11.2 (L) 12.0 - 15.0 g/dL   HCT 35.9 (L) 36.0 - 46.0 %   MCV 95.2 80.0 - 100.0 fL   MCH 29.7 26.0 - 34.0 pg   MCHC 31.2 30.0 - 36.0 g/dL   RDW 20.2 (H) 11.5 - 15.5 %   Platelets 285 150 - 400 K/uL   nRBC 0.0  0.0 - 0.2 %   Neutrophils Relative % 82 %   Neutro Abs 8.0 (H) 1.7 - 7.7 K/uL   Lymphocytes Relative 10 %   Lymphs Abs 0.9 0.7 - 4.0 K/uL   Monocytes Relative 7 %   Monocytes Absolute 0.7 0.1 - 1.0 K/uL   Eosinophils Relative 1 %   Eosinophils Absolute 0.1 0.0 - 0.5 K/uL   Basophils Relative 0 %   Basophils Absolute 0.0 0.0 - 0.1 K/uL   Immature Granulocytes 0 %   Abs Immature Granulocytes 0.02 0.00 - 0.07 K/uL    Comment: Performed at [facility] [address] [place], [place] [address]  Basic metabolic panel     Status: Abnormal   Collection Time: 05/09/19  6:38 AM  Result Value Ref Range   Sodium 138 135 - 145 mmol/L   Potassium 3.2 (L) 3.5 - 5.1 mmol/L   Chloride 94 (L) 98 - 111 mmol/L   CO2 30 22 - 32 mmol/L   Glucose, Bld 106 (H) 70 - 99 mg/dL   BUN 16 8 - 23 mg/dL   Creatinine, Ser 1.51 (H) 0.44 - 1.00 mg/dL   Calcium 8.5 (L) 8.9 - 10.3 mg/dL   GFR  calc non Af Amer 33 (L) >60 mL/min   GFR calc Af Amer 39 (L) >60 mL/min   Anion gap 14 5 - 15    Comment: Performed at [facility] [address], [place], [place] [address]  Troponin I (High Sensitivity)     Status: None   Collection Time: 05/09/19  6:38 AM  Result Value Ref Range   Troponin I (High Sensitivity) 7 <18 ng/L    Comment: (NOTE) Elevated high sensitivity troponin I (hsTnI) values and significant  changes across serial measurements may suggest ACS but many other  chronic and acute conditions are known to elevate hsTnI results.  Refer to the "Links" section for chest pain algorithms and additional  guidance. Performed at [facility], [place] [address], [place], [place] [address]   Brain natriuretic peptide     Status: None   Collection Time: 05/09/19  6:38 AM  Result Value Ref Range   B Natriuretic Peptide 58.8 0.0 - 100.0 pg/mL    Comment: Performed at [facility] [address], [place], [place] [address]  POC SARS Coronavirus 2 Ag-ED - Nasal Swab (BD Veritor Kit)     Status: None   Collection Time: 05/09/19  7:06 AM  Result Value Ref Range   SARS Coronavirus 2 Ag NEGATIVE NEGATIVE    Comment: (NOTE) SARS-CoV-2 antigen NOT DETECTED.  Negative results are presumptive.  Negative results do not preclude SARS-CoV-2 infection and should not be used as the sole basis for treatment or other patient management decisions, including infection  control decisions, particularly in the presence of clinical signs and  symptoms consistent with COVID-19, or in those who have been in contact with the virus.  Negative results must be combined with clinical observations, patient history, and epidemiological information. The expected result is Negative. Fact Sheet for Patients: PodPark.tn Fact Sheet for Healthcare Providers: GiftContent.is This test is not yet approved or cleared by the [place] FDA and   has been authorized for detection and/or diagnosis of SARS-CoV-2 by FDA under an Emergency Use Authorization (EUA).  This EUA will remain in effect (meaning this test can be used) for the duration of  the COVID-19 de claration under Section 564(b)(1) of the Act, 21  U.S.C. section 360bbb-3(b)(1), unless the authorization is terminated or revoked sooner.   SARS CORONAVIRUS 2 (TAT 6-24 HRS) Nasopharyngeal Nasopharyngeal Swab     Status: None   Collection Time: 05/09/19  8:13 AM   Specimen: Nasopharyngeal Swab  Result Value Ref Range   SARS Coronavirus 2 NEGATIVE NEGATIVE    Comment: (NOTE) SARS-CoV-2 target nucleic acids are NOT DETECTED. The SARS-CoV-2 RNA is generally detectable in upper and lower respiratory specimens during the acute phase of infection. Negative results do not preclude SARS-CoV-2 infection, do not rule out co-infections with other pathogens, and should not be used as the sole basis for treatment or other patient management decisions. Negative results must be combined with clinical observations, patient history, and epidemiological information. The expected result is Negative. Fact Sheet for Patients: SugarRoll.be Fact Sheet for Healthcare Providers: https://www.woods-mathews.com/ This test is not yet approved or cleared by the [place] FDA and  has been authorized for detection and/or diagnosis of SARS-CoV-2 by FDA under an Emergency Use Authorization (EUA). This EUA will remain  in effect (meaning this test can be used) for the duration of the COVID-19 declaration under Section 56 4(b)(1) of the Act, 21 U.S.C. section 360bbb-3(b)(1), unless the authorization is terminated or revoked sooner. Performed at [facility], [place] [address], [place], [place] [address]   Troponin I (High Sensitivity)     Status: None   Collection Time: 05/09/19  8:28 AM  Result Value Ref Range   Troponin I (High Sensitivity) 7 <18  ng/L    Comment: (NOTE) Elevated high sensitivity troponin I (hsTnI) values and significant  changes across serial measurements may suggest ACS but many other  chronic and acute conditions are known to elevate hsTnI results.  Refer to the "Links" section for chest pain algorithms and additional  guidance. Performed at [facility], [place] [address] [place], [place] [address]   Magnesium     Status: Abnormal   Collection Time: 05/09/19  8:28 AM  Result Value Ref Range   Magnesium 1.2 (L) 1.7 - 2.4 mg/dL    Comment: Performed at [facility] [address] [place], [place] [address]  Lactate     Status: None   Collection Time: 05/09/19  8:59 AM  Result Value Ref Range   Lactic Acid, Venous 1.9 0.5 - 1.9 mmol/L    Comment: Performed at [facility] [address], [place],  [address]  Urinalysis, Routine w reflex microscopic     Status: Abnormal   Collection Time: 05/09/19  9:42 AM  Result Value Ref Range   Color, Urine YELLOW YELLOW   APPearance HAZY (A) CLEAR   Specific Gravity, Urine 1.014 1.005 - 1.030   pH 5.0 5.0 - 8.0   Glucose, UA NEGATIVE NEGATIVE mg/dL   Hgb urine dipstick NEGATIVE NEGATIVE   Bilirubin Urine NEGATIVE NEGATIVE   Ketones, ur NEGATIVE NEGATIVE mg/dL   Protein, ur NEGATIVE NEGATIVE mg/dL   Nitrite NEGATIVE NEGATIVE   Leukocytes,Ua NEGATIVE NEGATIVE    Comment: Performed at [facility] [address] [place], [place] [address]  I-STAT 7, (LYTES, BLD GAS, ICA, H+H)     Status: Abnormal   Collection Time: 05/09/19 11:03 AM  Result Value Ref Range   pH, Arterial 7.427 7.350 - 7.450   pCO2 arterial 49.6 (H) 32.0 - 48.0 mmHg   pO2, Arterial 146.0 (H) 83.0 - 108.0 mmHg   Bicarbonate 32.7 (H) 20.0 - 28.0 mmol/L  TCO2 34 (H) 22 - 32 mmol/L   O2 Saturation 99.0 %   Acid-Base Excess 7.0 (H) 0.0 - 2.0 mmol/L   Sodium 138 135 - 145 mmol/L   Potassium 2.8 (L) 3.5 - 5.1 mmol/L   Calcium, Ion 1.12 (L) 1.15 - 1.40 mmol/L   HCT  29.0 (L) 36.0 - 46.0 %   Hemoglobin 9.9 (L) 12.0 - 15.0 g/dL   Patient temperature 98.7 F    Sample type ARTERIAL   Strep pneumoniae urinary antigen     Status: None   Collection Time: 05/09/19  3:31 PM  Result Value Ref Range   Strep Pneumo Urinary Antigen NEGATIVE NEGATIVE    Comment:        Infection due to S. pneumoniae cannot be absolutely ruled out since the antigen present may be below the detection limit of the test. Performed at [facility], [address], [place], [place] [address]    US RENAL  Result Date: 05/09/2019 CLINICAL DATA:  74 year old female with acute renal injury. EXAM: RENAL / URINARY TRACT ULTRASOUND COMPLETE COMPARISON:  CTA chest 10/01/2018. FINDINGS: Right Kidney: Renal measurements: 9.1 x 5.5 x 4.8 centimeters = volume: 128 mL. No hydronephrosis or right renal lesion. Cortical echogenicity appears within normal limits. Left Kidney: Renal measurements: 10.2 x 5.7 x 4.7 centimeters = volume: 142 mL. No left hydronephrosis or renal lesion. Normal cortical echogenicity. Bladder: Appears normal for degree of bladder distention. Other: None. IMPRESSION: Negative.  No acute renal findings. Electronically Signed   By: Genevie Ann M.D.   On: 05/09/2019 15:08   DG Chest Portable 1 View  Result Date: 05/09/2019 CLINICAL DATA:  Shortness of breath and cough. EXAM: PORTABLE CHEST 1 VIEW COMPARISON:  03/11/2019 FINDINGS: The cardiomediastinal silhouette is unchanged. Lung volumes are lower than on the prior study. There is improved right lung aeration with resolution of right midlung opacities and only minimal opacity currently present in the right lung base. There is mild left basilar opacity which is greater than on the prior study, and there are likely small left and possibly small residual right pleural effusions. No pneumothorax is identified. There is mild thoracolumbar dextroscoliosis. IMPRESSION: 1. Improved right lung aeration with minimal right basilar opacity  currently, likely atelectasis. 2. Increased left basilar opacity which may represent atelectasis or infection. 3. Suspected small pleural effusions. Electronically Signed   By: Logan Bores M.D.   On: 05/09/2019 07:40    Pending Labs Unresulted Labs (From admission, onward)    Start     Ordered   05/10/19 0500  Comprehensive metabolic panel  Tomorrow morning,   R     05/09/19 1249   05/10/19 0500  CBC  Tomorrow morning,   R     05/09/19 1249   05/09/19 1314  Respiratory Panel by PCR  (Respiratory virus panel with precautions)  Once,   STAT     05/09/19 1314   05/09/19 1313  Legionella Pneumophila Serogp 1 Ur Ag  Once,   STAT     05/09/19 1314   05/09/19 1240  MRSA PCR Screening  Once,   STAT     05/09/19 1249   05/09/19 0658  Urine culture  ONCE - STAT,   STAT     05/09/19 0658   05/09/19 0654  Blood culture (routine x 2)  BLOOD CULTURE X 2,   STAT     05/09/19 0653          Vitals/Pain Today's Vitals  05/09/19 1530 05/09/19 1630 05/09/19 1700 05/09/19 1730  BP: 131/71 127/67 (!) 120/44 (!) 109/47  Pulse:  88 77 (!) 141  Resp: (!) _0 Temp:      TempSrc:      SpO2:  98% (!) 78% (!) 83%  Weight:      Height:      PainSc:        Isolation Precautions Droplet precaution  Medications Medications  vancomycin (VANCOCIN) IVPB 1000 mg/200 mL premix (has no administration in time range)  levothyroxine (SYNTHROID) tablet 75 mcg (has no administration in time range)  Fluticasone-Umeclidin-Vilant 100-62.5-25 MCG/INH AEPB 1 puff (1 puff Inhalation Not Given 05/09/19 1447)  enoxaparin (LOVENOX) injection 30 mg (has no administration in time range)  ipratropium-albuterol (DUONEB) 0.5-2.5 (3) MG/3ML nebulizer solution 3 mL (3 mLs Nebulization Not Given 05/09/19 1501)  simvastatin (ZOCOR) tablet 40 mg (has no administration in time range)  ziprasidone (GEODON) capsule 40 mg (has no administration in time range)  ceFEPIme (MAXIPIME) 2 g in sodium chloride 0.9 % 100 mL IVPB (has  no administration in time range)  albuterol (VENTOLIN HFA) 108 (90 Base) MCG/ACT inhaler 8 puff (8 puffs Inhalation Given 05/09/19 0631)  ipratropium (ATROVENT HFA) inhaler 2 puff (2 puffs Inhalation Given 05/09/19 0643)  sodium chloride 0.9 % bolus 500 mL (0 mLs Intravenous Stopped 05/09/19 0918)  potassium chloride SA (KLOR-CON) CR tablet 40 mEq (40 mEq Oral Given 05/09/19 0935)  sodium chloride 0.9 % bolus 500 mL (0 mLs Intravenous Stopped 05/09/19 1053)  ceFEPIme (MAXIPIME) 2 g in sodium chloride 0.9 % 100 mL IVPB (0 g Intravenous Stopped 05/09/19 1151)  vancomycin (VANCOREADY) IVPB 1500 mg/300 mL (0 mg Intravenous Stopped 05/09/19 1453)    Mobility     Focused Assessments    R Recommendations: See Admitting Provider Note  Report given to:   Additional Notes:

## 2019-05-09 NOTE — ED Notes (Signed)
Lunch Ordered @ 1346.  

## 2019-05-09 NOTE — H&P (Signed)
Date: 05/09/2019               Patient Name:  Karla Clark MRN: [id number]  DOB: 08-Jun-1945 Age / Sex: 74 y.o., female   PCP: Charlott Rakes, MD         Medical Service: Internal Medicine Teaching Service         Attending Physician: Dr. Earl Lagos, MD    First Contact: Dr. Huel Cote Pager: [phone]  Second Contact: Dr. Chesley Mires Pager: [phone]       After Hours (After 5p/  First Contact Pager: [phone]  weekends / holidays): Second Contact Pager: [phone]   Chief Complaint: weakness  History of Present Illness:  Karla Clark is a 74yo female with COPD, rheumatoid arthritis, hypothyroidism, hypertension, and TIIDM presenting with shortness of breath and cough that started yesterday evening. Per chart review daughter was present at bedside earlier and states that her mother was developing increased cough, weakness and SOB yesterday evening. The patient states she hasn't been SOB except sometimes when she lies flat but does have continued dry, non-productive cough from previous pneumonia. She is not sure why she is here but does endorse some weakness. She does not wear O2 at home. She uses inhalers for COPD and does not feel like they have done much for her recently.  She currently cannot walk but is unsure why. She denies recent changes in medications but is unsure what she takes. She lives with her daughter who takes care of her medications. She denies recent sick contacts and has only been around her daughter.  She denies dizziness, headache, diarrhea, abdominal pain, nausea, dysuria or other changes in urination.  She is not sure who her primary doctor is.   Social:   She lives at home with her daughter.  She quit smoking many years ago but smoked for 20 years prior to this  She does not drink alcohol   Family History:  No family history on file.   Meds:  Current Meds  Medication Sig  . albuterol (PROVENTIL) (2.5 MG/3ML) 0.083% nebulizer solution  Take 2.5 mg by nebulization every 6 (six) hours as needed for wheezing or shortness of breath.  . ferrous sulfate 325 (65 FE) MG tablet Take 1 tablet (325 mg total) by mouth daily.  . Fluticasone-Umeclidin-Vilant (TRELEGY ELLIPTA) 100-62.5-25 MCG/INH AEPB Inhale 1 puff into the lungs daily.  . folic acid (FOLVITE) 1 MG tablet TAKE 1 TABLET BY MOUTH EVERY DAY (Patient taking differently: Take 1 mg by mouth daily. )  . furosemide (LASIX) 40 MG tablet Take 1 tablet (40 mg total) by mouth daily.  Marland Kitchen gabapentin (NEURONTIN) 600 MG tablet Take 1,200 mg by mouth 2 (two) times daily.  Marland Kitchen ipratropium-albuterol (DUONEB) 0.5-2.5 (3) MG/3ML SOLN Take 3 mLs by nebulization every 6 (six) hours as needed (shortness of breath).  Marland Kitchen levothyroxine (SYNTHROID) 75 MCG tablet Take 75 mcg by mouth daily.  Marland Kitchen losartan (COZAAR) 100 MG tablet Take 100 mg by mouth daily.  . metFORMIN (GLUCOPHAGE-XR) 500 MG 24 hr tablet Take 500 mg by mouth 2 (two) times daily.   . NYSTATIN powder Apply 1 g topically 2 (two) times daily as needed (rash).   . pantoprazole (PROTONIX) 40 MG tablet Take 40 mg by mouth daily.  . pioglitazone (ACTOS) 30 MG tablet Take 30 mg by mouth daily.   . potassium chloride 20 MEQ TBCR Take 20 mEq by mouth daily.  . simvastatin (ZOCOR) 40 MG tablet Take 40  mg by mouth daily at 6 PM.   . venlafaxine XR (EFFEXOR-XR) 150 MG 24 hr capsule Take 150 mg by mouth every morning.  . ziprasidone (GEODON) 40 MG capsule Take 40 mg by mouth at bedtime.      Allergies: Allergies as of 05/09/2019 - Review Complete 05/09/2019  Allergen Reaction Noted  . Ace inhibitors Cough 03/11/2016   Past Medical History:  Diagnosis Date  . Anxiety   . COPD (chronic obstructive pulmonary disease) ([place])   . Diabetes mellitus without complication ([place])   . Elevated cholesterol   . Hypertension   . Hypothyroidism   . Osteoarthritis   . RA (rheumatoid arthritis) ([place])   . 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   Physical Exam: Blood pressure (!) 122/58, pulse 80, temperature 98.7 F (37.1 C), temperature source Rectal, resp. rate 14, height 5\' 2"  (1.575 m), weight 77.1 kg, SpO2 98 %.  Constitution: NAD, supine in bed HENT: on supplemental O2, [place]/AT Cardio: RRR, no m/r/g  Respiratory: decreased breath sounds LLL, minimal wheeze LUL, otherwise CTA  Abdominal: soft, non-distended, NTTP  MSK: moving all extremities, limited movement right upper extremity Neuro: alert & oriented, slow affect  Skin: c/d/i   EKG: personally reviewed my interpretation is sinus tachycardia with prolonged QT interval.   CXR: personally reviewed my interpretation is small bilateral pleural effusions, increased left opacity with improvement in previous right lower lobe opacities.   Assessment & Plan by Problem: Active Problems:   Acute on chronic respiratory failure with hypoxia (HCC)   AKI (acute kidney injury) ([place])   Acute Hypoxia Acute COPD Exacerbation Patient with new oxygen requirement of 3L with O2 sats ~88% although during previous pneumonia/COPD exacerbation last month per discharge note she had qualified for home supplemental oxygen. She does have small amount of wheezing on exam and new left lower lobe opacity with improvement in previously right LL opacity although she is afebrile and without leukocytosis. She does have mild wheezing on exam, ABG was done in the ED which was wnl.   - will contact daughter for further information  - f/u covid test  - RVP, legionella urine, strep pneumo urine given recurrent opacities despite antibiotic therapy - duonebs q6h  - cont. Home Trelegy - continue empiric antibiotic coverage; narrow to cefepime if MRSA swab is negativev  - PT/OT  - wean O2 for >88%    Acute Kidney Injury Hypokalemia Per ED note daughter states patient has had decreased fluid intake in addition to decreased urination.  - renal US -  hold lasix 40mg   - hold venlafaxine  - am BMP, Mg  Diastolic Heart Failure - no evidence of acute exacerbation - holding Lasix due to AKI  Type II DM SSI sensitive. Last A1c 5.9.   Dysphagia Previously discharged on dysphagia 3 diet. Cont. Dysphagia 3 diet.   Schizophrenia Continue geodon 40 mg qhs, hold venlafaxine for AKI.   Diet: dysphagia 3 VTE: lovenox  IVF: s/p 1L in ED Code: full   Dispo: Admit patient to Observation with expected length of stay less than 2 midnights.  SignedMarty Heck, DO 05/09/2019, 1:21 PM  Pager: [phone]

## 2019-05-09 NOTE — Progress Notes (Addendum)
Pharmacy Antibiotic Note  Karla Clark is a 74 y.o. female admitted on 05/09/2019 with pneumonia.  Pharmacy has been consulted for vancomycin and cefepime dosing. Pt is afebrile and WBC is WNL. SCr is elevated above patients baseline at 1.51. Lactic acid is <2.   Plan: Vancomycin 1500mg  IV x 1 then 1g IV Q48H Cefepime 2gm IV Q12H F/u renal fxn, C&S, clinical status and peak/trough at SS  Height: 5\' 2"  (157.5 cm) Weight: 170 lb (77.1 kg) IBW/kg (Calculated) : 50.1  Temp (24hrs), Avg:98.3 F (36.8 C), Min:97.8 F (36.6 C), Max:98.7 F (37.1 C)  Recent Labs  Lab 05/09/19 0638 05/09/19 0859  WBC 9.8  --   CREATININE 1.51*  --   LATICACIDVEN  --  1.9    Estimated Creatinine Clearance: 30.9 mL/min (A) (by C-G formula based on SCr of 1.51 mg/dL (H)).    Allergies  Allergen Reactions  . Ace Inhibitors Cough    Antimicrobials this admission: Vanc 1/6>> Cefepime 1/6>>  Dose adjustments this admission: N/A  Microbiology results: Pending   Teresea Donley, 07/07/19 05/09/2019 10:17 AM

## 2019-05-09 NOTE — ED Notes (Signed)
PA Geiple aware pt unable to ambulate at baseline, pt satting in the 70's on RA with any exertion, pt placed back on 2L [place].

## 2019-05-10 DIAGNOSIS — E039 Hypothyroidism, unspecified: Secondary | ICD-10-CM | POA: Diagnosis present

## 2019-05-10 DIAGNOSIS — R1312 Dysphagia, oropharyngeal phase: Secondary | ICD-10-CM | POA: Diagnosis present

## 2019-05-10 DIAGNOSIS — R918 Other nonspecific abnormal finding of lung field: Secondary | ICD-10-CM | POA: Diagnosis not present

## 2019-05-10 DIAGNOSIS — F209 Schizophrenia, unspecified: Secondary | ICD-10-CM | POA: Diagnosis present

## 2019-05-10 DIAGNOSIS — J449 Chronic obstructive pulmonary disease, unspecified: Secondary | ICD-10-CM | POA: Diagnosis not present

## 2019-05-10 DIAGNOSIS — Z888 Allergy status to other drugs, medicaments and biological substances status: Secondary | ICD-10-CM | POA: Diagnosis not present

## 2019-05-10 DIAGNOSIS — I5032 Chronic diastolic (congestive) heart failure: Secondary | ICD-10-CM | POA: Diagnosis present

## 2019-05-10 DIAGNOSIS — E119 Type 2 diabetes mellitus without complications: Secondary | ICD-10-CM

## 2019-05-10 DIAGNOSIS — E876 Hypokalemia: Secondary | ICD-10-CM | POA: Diagnosis present

## 2019-05-10 DIAGNOSIS — Z79890 Hormone replacement therapy (postmenopausal): Secondary | ICD-10-CM

## 2019-05-10 DIAGNOSIS — Z87891 Personal history of nicotine dependence: Secondary | ICD-10-CM | POA: Diagnosis not present

## 2019-05-10 DIAGNOSIS — Z79899 Other long term (current) drug therapy: Secondary | ICD-10-CM | POA: Diagnosis not present

## 2019-05-10 DIAGNOSIS — M069 Rheumatoid arthritis, unspecified: Secondary | ICD-10-CM

## 2019-05-10 DIAGNOSIS — R1310 Dysphagia, unspecified: Secondary | ICD-10-CM

## 2019-05-10 DIAGNOSIS — Z20822 Contact with and (suspected) exposure to covid-19: Secondary | ICD-10-CM | POA: Diagnosis present

## 2019-05-10 DIAGNOSIS — Z8701 Personal history of pneumonia (recurrent): Secondary | ICD-10-CM

## 2019-05-10 DIAGNOSIS — N179 Acute kidney failure, unspecified: Secondary | ICD-10-CM

## 2019-05-10 DIAGNOSIS — M1990 Unspecified osteoarthritis, unspecified site: Secondary | ICD-10-CM | POA: Diagnosis present

## 2019-05-10 DIAGNOSIS — R0602 Shortness of breath: Secondary | ICD-10-CM | POA: Diagnosis present

## 2019-05-10 DIAGNOSIS — E860 Dehydration: Secondary | ICD-10-CM | POA: Diagnosis present

## 2019-05-10 DIAGNOSIS — I110 Hypertensive heart disease with heart failure: Secondary | ICD-10-CM | POA: Diagnosis present

## 2019-05-10 DIAGNOSIS — Z7951 Long term (current) use of inhaled steroids: Secondary | ICD-10-CM | POA: Diagnosis not present

## 2019-05-10 DIAGNOSIS — J9601 Acute respiratory failure with hypoxia: Secondary | ICD-10-CM | POA: Diagnosis present

## 2019-05-10 DIAGNOSIS — Z7984 Long term (current) use of oral hypoglycemic drugs: Secondary | ICD-10-CM | POA: Diagnosis not present

## 2019-05-10 DIAGNOSIS — J690 Pneumonitis due to inhalation of food and vomit: Secondary | ICD-10-CM | POA: Diagnosis present

## 2019-05-10 DIAGNOSIS — I959 Hypotension, unspecified: Secondary | ICD-10-CM | POA: Diagnosis present

## 2019-05-10 DIAGNOSIS — E7800 Pure hypercholesterolemia, unspecified: Secondary | ICD-10-CM | POA: Diagnosis present

## 2019-05-10 DIAGNOSIS — J9621 Acute and chronic respiratory failure with hypoxia: Secondary | ICD-10-CM | POA: Diagnosis present

## 2019-05-10 DIAGNOSIS — J440 Chronic obstructive pulmonary disease with acute lower respiratory infection: Secondary | ICD-10-CM | POA: Diagnosis present

## 2019-05-10 DIAGNOSIS — I5030 Unspecified diastolic (congestive) heart failure: Secondary | ICD-10-CM

## 2019-05-10 DIAGNOSIS — J181 Lobar pneumonia, unspecified organism: Secondary | ICD-10-CM | POA: Diagnosis not present

## 2019-05-10 LAB — COMPREHENSIVE METABOLIC PANEL
ALT: 13 U/L (ref 0–44)
AST: 15 U/L (ref 15–41)
Albumin: 2.5 g/dL — ABNORMAL LOW (ref 3.5–5.0)
Alkaline Phosphatase: 82 U/L (ref 38–126)
Anion gap: 11 (ref 5–15)
BUN: 10 mg/dL (ref 8–23)
CO2: 27 mmol/L (ref 22–32)
Calcium: 8.4 mg/dL — ABNORMAL LOW (ref 8.9–10.3)
Chloride: 100 mmol/L (ref 98–111)
Creatinine, Ser: 0.7 mg/dL (ref 0.44–1.00)
GFR calc Af Amer: >60 mL/min (ref >60)
GFR calc non Af Amer: >60 mL/min (ref >60)
Glucose, Bld: 263 mg/dL — ABNORMAL HIGH (ref 70–99)
Potassium: 3.3 mmol/L — ABNORMAL LOW (ref 3.5–5.1)
Sodium: 138 mmol/L (ref 135–145)
Total Bilirubin: 0.4 mg/dL (ref 0.3–1.2)
Total Protein: 5.4 g/dL — ABNORMAL LOW (ref 6.5–8.1)

## 2019-05-10 LAB — URINE CULTURE: Culture: NO GROWTH

## 2019-05-10 LAB — GLUCOSE, CAPILLARY
Glucose-Capillary: 266 mg/dL — ABNORMAL HIGH (ref 70–99)
Glucose-Capillary: 79 mg/dL (ref 70–99)
Glucose-Capillary: 137 mg/dL — ABNORMAL HIGH (ref 70–99)

## 2019-05-10 LAB — CBC
HCT: 33.5 % — ABNORMAL LOW (ref 36.0–46.0)
Hemoglobin: 10.6 g/dL — ABNORMAL LOW (ref 12.0–15.0)
MCH: 29.4 pg (ref 26.0–34.0)
MCHC: 31.6 g/dL (ref 30.0–36.0)
MCV: 92.8 fL (ref 80.0–100.0)
Platelets: 251 10*3/uL (ref 150–400)
RBC: 3.61 MIL/uL — ABNORMAL LOW (ref 3.87–5.11)
RDW: 19.9 % — ABNORMAL HIGH (ref 11.5–15.5)
WBC: 5.9 10*3/uL (ref 4.0–10.5)
nRBC: 0 % (ref 0.0–0.2)

## 2019-05-10 LAB — LEGIONELLA PNEUMOPHILA SEROGP 1 UR AG: L. pneumophila Serogp 1 Ur Ag: NEGATIVE

## 2019-05-10 LAB — MRSA PCR SCREENING: MRSA by PCR: POSITIVE — AB

## 2019-05-10 MED ORDER — VENLAFAXINE HCL ER 150 MG PO CP24
150.0000 mg | ORAL_CAPSULE | Freq: Every day | ORAL | Status: DC
  Administered 2019-05-10 – 2019-05-12 (×3): 150 mg via ORAL
  Filled 2019-05-10 (×3): qty 1

## 2019-05-10 MED ORDER — MAGNESIUM SULFATE 2 GM/50ML IV SOLN
2.0000 g | Freq: Once | INTRAVENOUS | Status: AC
  Administered 2019-05-10: 2 g via INTRAVENOUS
  Filled 2019-05-10: qty 50

## 2019-05-10 MED ORDER — IPRATROPIUM-ALBUTEROL 0.5-2.5 (3) MG/3ML IN SOLN
3.0000 mL | Freq: Two times a day (BID) | RESPIRATORY_TRACT | Status: DC
  Administered 2019-05-10 – 2019-05-12 (×4): 3 mL via RESPIRATORY_TRACT
  Filled 2019-05-10 (×4): qty 3

## 2019-05-10 MED ORDER — ENOXAPARIN SODIUM 40 MG/0.4ML ~~LOC~~ SOLN
40.0000 mg | SUBCUTANEOUS | Status: DC
  Administered 2019-05-10 – 2019-05-11 (×2): 40 mg via SUBCUTANEOUS
  Filled 2019-05-10 (×2): qty 0.4

## 2019-05-10 MED ORDER — POTASSIUM CHLORIDE CRYS ER 20 MEQ PO TBCR
20.0000 meq | EXTENDED_RELEASE_TABLET | Freq: Once | ORAL | Status: AC
  Administered 2019-05-10: 11:00:00 20 meq via ORAL
  Filled 2019-05-10: qty 1

## 2019-05-10 MED ORDER — INSULIN ASPART 100 UNIT/ML ~~LOC~~ SOLN
0.0000 [IU] | Freq: Three times a day (TID) | SUBCUTANEOUS | Status: DC
  Administered 2019-05-10: 8 [IU] via SUBCUTANEOUS
  Administered 2019-05-11: 06:00:00 2 [IU] via SUBCUTANEOUS
  Administered 2019-05-11 – 2019-05-12 (×3): 3 [IU] via SUBCUTANEOUS

## 2019-05-10 MED ORDER — INSULIN ASPART 100 UNIT/ML ~~LOC~~ SOLN: 0.0000 [IU] | Freq: Every day | SUBCUTANEOUS | Status: DC

## 2019-05-10 MED ORDER — SODIUM CHLORIDE 0.9 % IV SOLN
3.0000 g | Freq: Four times a day (QID) | INTRAVENOUS | Status: DC
  Administered 2019-05-10 – 2019-05-12 (×8): 3 g via INTRAVENOUS
  Filled 2019-05-10 (×3): qty 8
  Filled 2019-05-10 (×2): qty 3
  Filled 2019-05-10 (×3): qty 8
  Filled 2019-05-10 (×3): qty 3
  Filled 2019-05-10: qty 8

## 2019-05-10 NOTE — Progress Notes (Signed)
Physical Therapy Treatment Patient Details Name: Karla Clark MRN: [id number] DOB: Oct 18, 1945 Today's Date: 05/10/2019    History of Present Illness Patient is a 74 y/o female admitted with increased cough, weakness and SOB due to PNA.  PMH positive for diabetes, COPD, HTN, hypothyroidism, anxiety, OA and RA. Here in 03/2019 with PNA, resp failure, UTI.    PT Comments    Pt admitted for above. She required mod assist for bed mobility and transfers today as described below with generalized weakness. Per OT phone call with daughter, pt was ambulating 5-7 ft on her own at home prior to admission. Based on this information and today's session, she is not near her baseline in terms of mobility. Per daughter, pt will have assistance at home from daughter and son, who both feel comfortable giving mod assist for mobility at this time. Recommend HH PT, OT, and aide to continue to address impairments following acute discharge. Pt would benefit from continued skilled PT intervention in order to address deficits.   Follow Up Recommendations  Home health PT(HH OT; aide)     Equipment Recommendations  None recommended by PT    Recommendations for Other Services       Precautions / Restrictions Precautions Precautions: Fall Precaution Comments: on O2 at home Restrictions Weight Bearing Restrictions: No    Mobility  Bed Mobility Overal bed mobility: Needs Assistance Bed Mobility: Supine to Sit     Supine to sit: Mod assist;HOB elevated     General bed mobility comments: mod assist for hip scooting, trunk righting, and B LE management; hand held assist to sitting EOB with use of pads  Transfers Overall transfer level: Needs assistance Equipment used: 1 person hand held assist Transfers: Stand Pivot Transfers;Sit to/from Stand Sit to Stand: Mod assist Stand pivot transfers: Min assist       General transfer comment: mod assist to power up, min assist to pivot to chair; pt  demonstrated LE weakness and was not able to mobilize further than the chair  Ambulation/Gait                 Stairs             Wheelchair Mobility    Modified Rankin (Stroke Patients Only)       Balance Overall balance assessment: Needs assistance Sitting-balance support: Feet unsupported Sitting balance-Leahy Scale: Fair Sitting balance - Comments: able to maintain static standing with min guard assist    Standing balance support: Single extremity supported Standing balance-Leahy Scale: Poor Standing balance comment: requires mod A                             Cognition Arousal/Alertness: Awake/alert Behavior During Therapy: WFL for tasks assessed/performed Overall Cognitive Status: No family/caregiver present to determine baseline cognitive functioning                                 General Comments: Per OT, daughter reports h/o dementia over phone; not present so unable to determine if pt is at her baseline      Exercises      General Comments        Pertinent Vitals/Pain Pain Assessment: No/denies pain    Home Living Family/patient expects to be discharged to:: Private residence Living Arrangements: Children Available Help at Discharge: Available 24 hours/day Type of Home: House Home Access: Ramped entrance  Home Layout: One level Home Equipment: Walker - 2 wheels;Bedside commode;Wheelchair - manual;Hand held shower head;Shower seat Additional Comments: Daughter lives with her, and she and brother provide almost 24 hour assist - there are occasions when daughter has to leave her for periods of time, she has a camera in which she can monitor her, and/or other family members stay with her     Prior Function Level of Independence: Needs assistance  Gait / Transfers Assistance Needed: Per daughter, pt mostly moved bed to recliner which is ~12ft from the bed and is able to transfer without assist.  She is pushed to the BR  in her w/c, where it is parked at the door, and pt walks to the toilet or shower without assist  ADL's / Homemaking Assistance Needed: Pt is able to pull pants up and down, thread feet through pants, don her shirt, and feed herself  Comments: Pt had a HHaid 2 days/week, but HH was getting ready to discharge her just before admission    PT Goals (current goals can now be found in the care plan section) Acute Rehab PT Goals Patient Stated Goal: pt did not state  PT Goal Formulation: With patient Time For Goal Achievement: 05/23/19 Potential to Achieve Goals: Fair Progress towards PT goals: Progressing toward goals    Frequency    Min 3X/week      PT Plan Current plan remains appropriate    Co-evaluation              AM-PAC PT "6 Clicks" Mobility   Outcome Measure  Help needed turning from your back to your side while in a flat bed without using bedrails?: A Little Help needed moving from lying on your back to sitting on the side of a flat bed without using bedrails?: A Lot Help needed moving to and from a bed to a chair (including a wheelchair)?: A Lot Help needed standing up from a chair using your arms (e.g., wheelchair or bedside chair)?: A Lot Help needed to walk in hospital room?: Total Help needed climbing 3-5 steps with a railing? : Total 6 Click Score: 11    End of Session Equipment Utilized During Treatment: Gait belt Activity Tolerance: Patient tolerated treatment well Patient left: in chair;with call bell/phone within reach Nurse Communication: Mobility status PT Visit Diagnosis: Other abnormalities of gait and mobility (R26.89);Muscle weakness (generalized) (M62.81)     Time: 6659-9357 PT Time Calculation (min) (ACUTE ONLY): 24 min  Charges:  $Therapeutic Activity: 23-37 mins                     Christel Mormon, SPT    Silvana Newness 05/10/2019, 12:47 PM

## 2019-05-10 NOTE — TOC Progression Note (Addendum)
Transition of Care [facility]) - Progression Note    Patient Details  Name: Karla Clark MRN: [id number] Date of Birth: [birth year]  Transition of [facility] CM/SW Contact  Nadene Rubins Adria Devon, RN Phone Number: 05/10/2019, 2:52 PM  Clinical Narrative:    Referral faxed, will fax discharge summary when complete. Spoke to daughter Sheliah Mends via phone . Patient lives with daughter. Patient active with [facility] [phone]. Called same spoke to [place] patient active for [facility] and aide, they will need orders and face to face for HHRN,PT,OT aide. Paged MD .   Referral will need to be faxed to [phone]  Expected Discharge Plan: Home w Home Health Services Barriers to Discharge: Continued Medical Work up  Expected Discharge Plan and Services Expected Discharge Plan: Home w Home Health Services   Discharge Planning Services: CM Consult                     DME Arranged: N/A         HH Arranged: RN, PT, OT, Nurse's Aide HH Agency: [facility] Date [facility] Agency Contacted: 05/10/19 Time HH Agency Contacted: 1451 Representative spoke with at [facility] Agency: Alvino Chapel   Social Determinants of Health (SDOH) Interventions    Readmission Risk Interventions No flowsheet data found.

## 2019-05-10 NOTE — Evaluation (Signed)
Occupational Therapy Evaluation Patient Details Name: Karla Clark MRN: [id number] DOB: 1945-11-21 Today's Date: 05/10/2019    History of Present Illness Patient is a 74 y/o female admitted with increased cough, weakness and SOB due to PNA.  PMH positive for diabetes, COPD, HTN, hypothyroidism, anxiety, OA and RA. Here in 03/2019 with PNA, resp failure, UTI.   Clinical Impression   Pt admitted with above. She demonstrates the below listed deficits and will benefit from continued OT to maximize safety and independence with BADLs.  She presents with generalized weakness, impaired balance, as well as impaired cognition (long standing).  She currently requires mod A for functional transfers and mod - max A for ADLs except set up for self feeding.  Pt's daughter lives with her and family provides needed assist at home.  She was able to ambulate short distances 5-10 feet without assist, and was able to assist with ADLs.   Will follow acutely, and recommend HHOT, PT, and HHaide       Follow Up Recommendations  Home health OT;Supervision/Assistance - 24 hour also recommend HHaide    Equipment Recommendations  None recommended by OT    Recommendations for Other Services       Precautions / Restrictions Precautions Precautions: Fall Precaution Comments: on O2 at home      Mobility Bed Mobility               General bed mobility comments: Pt sitting up in chair   Transfers Overall transfer level: Needs assistance Equipment used: 1 person hand held assist Transfers: Sit to/from Stand;Stand Pivot Transfers Sit to Stand: Mod assist Stand pivot transfers: Mod assist       General transfer comment: assist to power up and assist to pivot     Balance Overall balance assessment: Needs assistance Sitting-balance support: Feet unsupported Sitting balance-Leahy Scale: Fair Sitting balance - Comments: able to maintain static standing with min guard assist    Standing balance  support: Single extremity supported Standing balance-Leahy Scale: Poor Standing balance comment: requires mod A                            ADL either performed or assessed with clinical judgement   ADL Overall ADL's : Needs assistance/impaired Eating/Feeding: Set up;Sitting   Grooming: Wash/dry face;Wash/dry hands;Oral care;Brushing hair;Moderate assistance;Sitting   Upper Body Bathing: Maximal assistance;Sitting   Lower Body Bathing: Total assistance;Sit to/from stand   Upper Body Dressing : Moderate assistance;Sitting   Lower Body Dressing: Maximal assistance;Sit to/from stand   Toilet Transfer: Moderate assistance;Stand-pivot;BSC   Toileting- Clothing Manipulation and Hygiene: Total assistance;Sit to/from stand       Functional mobility during ADLs: Moderate assistance       Vision         Perception     Praxis      Pertinent Vitals/Pain Pain Assessment: No/denies pain     Hand Dominance Right   Extremity/Trunk Assessment Upper Extremity Assessment Upper Extremity Assessment: RUE deficits/detail;LUE deficits/detail RUE Deficits / Details: 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 able to use spoon on R and hook a cup with L, but uses compensatory techniques RUE Coordination: decreased fine motor;decreased gross motor LUE Deficits / Details: 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 able to use spoon on R and hook a cup with L, but uses compensatory techniques LUE Coordination: decreased fine motor;decreased gross motor   Lower Extremity Assessment Lower Extremity Assessment: Defer to PT evaluation   Cervical / Trunk  Assessment Cervical / Trunk Assessment: Kyphotic   Communication Communication Communication: No difficulties   Cognition Arousal/Alertness: Awake/alert Behavior During Therapy: Flat affect Overall Cognitive Status: No family/caregiver present to determine baseline cognitive functioning                                 General Comments: Daughter  reports pt with h/o dementia.  Daughter not present (spoke with her via phone), so is unable to confirm if she is fully at baseline    General Comments       Exercises     Shoulder Instructions      Home Living Family/patient expects to be discharged to:: Private residence Living Arrangements: Children Available Help at Discharge: Available 24 hours/day Type of Home: House Home Access: Ramped entrance     Home Layout: One level     Bathroom Shower/Tub: Producer, television/film/video: Standard Bathroom Accessibility: Yes   Home Equipment: Environmental consultant - 2 wheels;Bedside commode;Wheelchair - manual;Hand held shower head;Shower seat   Additional Comments: Daughter lives with her, and she and brother provide almost 24 hour assist - there are occasions when daughter has to leave her for periods of time, she has a camera in which she can monitor her, and/or other family members stay with her       Prior Functioning/Environment Level of Independence: Needs assistance  Gait / Transfers Assistance Needed: Per daughter, pt mostly moved bed to recliner which is ~91ft from the bed and is able to transfer without assist.  She is pushed to the BR in her w/c, where it is parked at the door, and pt walks to the toilet or shower without assist  ADL's / Homemaking Assistance Needed: Pt is able to pull pants up and down, thread feet through pants, don her shirt, and feed herself    Comments: Pt had a HHaid 2 days/week, but HH was getting ready to discharge her just before admission         OT Problem List: Decreased strength;Decreased range of motion;Decreased activity tolerance;Impaired balance (sitting and/or standing);Decreased coordination;Decreased cognition;Decreased safety awareness;Decreased knowledge of use of DME or AE;Impaired UE functional use      OT Treatment/Interventions: Self-care/ADL training;DME and/or AE instruction;Therapeutic activities;Cognitive  remediation/compensation;Patient/family education;Balance training    OT Goals(Current goals can be found in the care plan section) Acute Rehab OT Goals Patient Stated Goal: pt did not state  OT Goal Formulation: With patient Time For Goal Achievement: 05/24/19 Potential to Achieve Goals: Good ADL Goals Pt Will Perform Upper Body Dressing: with min guard assist;sitting Pt Will Perform Lower Body Dressing: with min assist;sit to/from stand Pt Will Transfer to Toilet: with min guard assist;stand pivot transfer;bedside commode  OT Frequency: Min 2X/week   Barriers to D/C:            Co-evaluation              AM-PAC OT "6 Clicks" Daily Activity     Outcome Measure Help from another person eating meals?: A Little Help from another person taking care of personal grooming?: A Lot Help from another person toileting, which includes using toliet, bedpan, or urinal?: A Lot Help from another person bathing (including washing, rinsing, drying)?: A Lot Help from another person to put on and taking off regular upper body clothing?: A Lot Help from another person to put on and taking off regular lower body clothing?: Total 6 Click Score:  12   End of Session Nurse Communication: Mobility status  Activity Tolerance: Patient limited by fatigue Patient left: in chair;with call bell/phone within reach  OT Visit Diagnosis: Muscle weakness (generalized) (M62.81);Unsteadiness on feet (R26.81);Cognitive communication deficit (R41.841)                Time: 1660-6004 OT Time Calculation (min): 29 min Charges:  OT General Charges $OT Visit: 1 Visit OT Evaluation $OT Eval Moderate Complexity: 1 Mod OT Treatments $Self Care/Home Management : 8-22 mins  Nilsa Nutting., OTR/L Acute Rehabilitation Services Pager [phone] Office [place], [place] 05/10/2019, 11:39 AM

## 2019-05-10 NOTE — Progress Notes (Signed)
  Date: 05/10/2019  Patient name: Karla Clark  Medical record number: [id number]  Date of birth: 03/14/1946   I have seen and evaluated Karla Clark and discussed their care with the Residency Team.  In brief, patient is a 74 year old female with a past medical history of COPD, RA, hypothyroidism, hypertension and type 2 diabetes who presented to the ED with worsening shortness of breath and cough x1 day.  History obtained from chart and patient.  Patient states that she has no shortness of breath currently but that she did have mild shortness of breath on admission and also had cough on the day prior to her admission.  Cough is dry nonproductive.  She denies any fevers or chills, no headache, no blurry vision, no chest pain, no palpitations, no diaphoresis, no nausea or vomiting, no diarrhea, no abdominal pain.  Patient states that she feels much better today.  Patient did complain of some tenderness over her sternum which worsened when she leaned forward and on palpation.  PMHx, Fam Hx, and/or Soc Hx : As per resident admit note  Vitals:   05/10/19 0542 05/10/19 1223  BP: 138/66 (!) 115/53  Pulse: 85 86  Resp: 15 16  Temp: (!) 97.5 F (36.4 C) 98 F (36.7 C)  SpO2: 94% 96%   General: Awake, alert, NAD CVS: Regular rate and rhythm, normal heart sounds Lungs: CTA bilaterally Abdomen: Soft, nontender, nondistended, normoactive bowel sounds Extremities: No edema noted, nontender to palpation Psych: Normal mood and affect Skin: Warm and dry Neuro: Oriented, moving all 4 extremities, contractures in bilateral hands  Assessment and Plan: I have seen and evaluated the patient as outlined above. I agree with the formulated Assessment and Plan as detailed in the residents' note, with the following changes:   1.  Acute hypoxic respiratory failure: -Patient presented to the ED with new onset cough and shortness of breath and was found to be hypoxic requiring oxygen supplementation.   On imaging patient was found to have new left lower lobe opacity consistent with pneumonia. -I am concerned that patient has possible aspiration pneumonia.  She is on a dysphagia 3 diet at home but is also admitted recently for a right lower lobe pneumonia. -We will transition the patient to Unasyn to cover for possible aspiration pneumonia -We will obtain SLP evaluation to evaluate swelling -Patient currently with O2 sats in the 90s on room air. -Patient is afebrile with no leukocytosis.  Could consider transition to oral Augmentin today and possible discharge home if she continues to do well -Blood culture with no growth to date and urine culture is negative as well -OT/PT recommending home PT/OT -Patient also noted to have AKI likely secondary to dehydration which resolved with IV fluids and holding of her Lasix.  Renal ultrasound with no acute findings.  No further work-up at this time -Patient should be stable for DC home later today after her SLP evaluation   Earl Lagos, MD 1/7/20211:18 PM

## 2019-05-10 NOTE — Progress Notes (Signed)
   Subjective: Pt states she is doing well today. She reports NAEON. She is able to sit upright today without need for O2. Denies any SOB, fevers, chills. She seems to be mentation reasonably well.  Objective:  Vital signs in last 24 hours: Vitals:   05/09/19 2000 05/09/19 2047 05/09/19 2221 05/10/19 0542  BP: (!) 116/45 (!) 120/57  138/66  Pulse: 93 94  85  Resp: 15 16  15   Temp:  99 F (37.2 C)  (!) 97.5 F (36.4 C)  TempSrc:  Oral  Oral  SpO2: 97% 100% 96% 94%  Weight:    71.8 kg  Height:       Weight change:   Intake/Output Summary (Last 24 hours) at 05/10/2019 1101 Last data filed at 05/10/2019 0500 Gross per 24 hour  Intake 760 ml  Output --  Net 760 ml    Physical Exam: Gen: NAD, sitting in chair, on RA CV:  RRR no m./g/r Pulm: possible wheezes however difficult to appreciate during exam due to difficulty with positioning patient Abdomen: soft not distended MSK: moving all extremities. Digits 3-5 on hands bilaterally have resting contracture, unable to extend using force Skin: no rashes Neuro: AO, no focal deficits Psych: warm, dry   Assessment/Plan:  Active Problems:   Acute on chronic respiratory failure with hypoxia (HCC)   AKI (acute kidney injury) (HCC)   LOS: 0 days   Acute COPD Exacerbation  Acute COPD Exacerbation Condition has improved today, O2 demand has normalized to apparent baseline as she is able to sit in chair on RA with desaturations.  RVP/COVID negative She continues to have no signs of infection/afebile/nl WBC and only mild cough, however to err on side of caution we will continue abx and switch from cefepime/vanc to Unasyn to cover for anaerobes as there is some suggestive evidence of pneumonia on CXR. It is possible given her presentation and rapid resolution that her symptoms are a result of aspiration given her dysphagia. Should she maintain her sats throughout the afternoon she will be able to be discharged today.  - duonebs q6h  - stop  vefepime,/vancomycin - start Unasyn 3 g IV q6 hours today, can start Augmentin for outpatient treatment - discharge today should she maintain sats/remaine asymptomatic - SOP consult  Acute Kidney Injury  Hypokalemia S/p 2 500 mL NS boluses yesterday (05/09/19) for apparent dehydration, creatine improved to 0.7 from 1.5. Consistent with pre-renal AKI. She is able to drink fluids this morning.  - stable - restart venlafaxine - continue to hold lasix  Diastolic Heart Failure Continued to show no evidence of acute exacerbation.  - hold lasix  Type II DM Glucose 263 on AM BMP. - started SSI  Dysphagia Previously discharged on dysphagia 3 diet. Cont. Dysphagia 3 diet.   Schizophrenia - continue geodon 40 mg qhs - restart venlafaxine  Hypothyroidism Controlled, continue synthroid  Diet - dysphagia 3 diet Fluids - none DVT ppx -  enoxaparin Bowel Regimen - none   Code Status: Full Code    07/07/19, Medical Student 05/10/2019, 11:01 AM Pager: [phone]

## 2019-05-10 NOTE — Evaluation (Signed)
Clinical/Bedside Swallow Evaluation Patient Details  Name: Karla Clark MRN: [id number] Date of Birth: [birth year]  Today's Date: 05/10/2019 Time: SLP Start Time (ACUTE ONLY): 1225 SLP Stop Time (ACUTE ONLY): 1252 SLP Time Calculation (min) (ACUTE ONLY): 27 min  Past Medical History:  Past Medical History:  Diagnosis Date  . Anxiety   . COPD (chronic obstructive pulmonary disease) ([place])   . Diabetes mellitus without complication ([place])   . Elevated cholesterol   . Hypertension   . Hypothyroidism   . Osteoarthritis   . RA (rheumatoid arthritis) ([place])   . 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 ABDOMINAL HYSTERECTOMY     HPI:  Ms. Karla Clark, 73y/f, presented to ED with SOB and cough. PMH COPD, rheumatoid arthritis, hypothryroidism, hypertension and DM type 2. Patient wears O2 at baseline. She was seen 03/09/19 by ST during an admission for PNA. At that time she was found to have oralpharyngeal dysphagia and eventually discharged with a soft diet with single sips of liquids.    Assessment / Plan / Recommendation Clinical Impression  Clinical swallow evaluation completed at bedside. Patient exhibites a mild to moderate oralpharyngeal dysphagia. Prolonged mastication, poor awareness of bolus and oral residue after swallow. Immediate throat clear with thin liquid boluses unless  sips are small single sips from cup only. With patient's history of dyspahgia and recent admission for PNA, recommmend patient proceed with a MBS to further assess swallowing function.  SLP Visit Diagnosis: Dysphagia, oropharyngeal phase (R13.12)    Aspiration Risk  Moderate aspiration risk    Diet Recommendation Dysphagia 3 (Mech soft);Thin liquid   Liquid Administration via: Cup Medication Administration: Whole meds with puree Supervision: Staff to assist with self feeding Compensations: Minimize environmental  distractions;Slow rate;Small sips/bites Postural Changes: Seated upright at 90 degrees    Other  Recommendations Oral Care Recommendations: Oral care BID                       Prognosis Prognosis for Safe Diet Advancement: Fair      Swallow Study   General Date of Onset: 05/09/19 HPI: Ms. Karla Clark, 73y/f, presented to ED with SOB and cough. PMH COPD, rheumatoid arthritis, hypothryroidism, hypertension and DM type 2. Patient wears O2 at baseline. She was seen 03/09/19 by ST during an admission for PNA. At that time she was found to have oralpharyngeal dysphagia and eventually discharged with a soft diet with single sips of liquids.  Type of Study: Bedside Swallow Evaluation Previous Swallow Assessment: 11/6 and 11/9 MBS Diet Prior to this Study: Thin liquids;Dysphagia 3 (soft) Temperature Spikes Noted: No Respiratory Status: Nasal cannula;Room air History of Recent Intubation: No Behavior/Cognition: Alert;Cooperative Oral Cavity Assessment: Within Functional Limits Oral Care Completed by SLP: No Oral Cavity - Dentition: Missing dentition Vision: Functional for self-feeding Self-Feeding Abilities: Able to feed self;Needs set up Patient Positioning: Upright in bed Baseline Vocal Quality: Hoarse Volitional Cough: Weak Volitional Swallow: Able to elicit    Oral/Motor/Sensory Function Overall Oral Motor/Sensory Function: Generalized oral weakness   Ice Chips Ice chips: Within functional limits Presentation: Spoon   Thin Liquid Thin Liquid: Impaired Presentation: Cup;Straw Oral Phase Impairments: Impaired mastication Oral Phase Functional Implications: Oral holding;Oral residue Pharyngeal  Phase Impairments: Throat Clearing - Immediate    Nectar Thick Nectar Thick Liquid: Not tested   Honey Thick Honey Thick Liquid: Not tested   Puree Puree: Within  functional limits   Solid     Solid: Impaired Presentation: Spoon Oral Phase Impairments: Impaired mastication       Luis Abed., MA, CCC-SLP 05/10/2019,2:40 PM

## 2019-05-11 ENCOUNTER — Inpatient Hospital Stay (HOSPITAL_COMMUNITY): Payer: Medicare Other

## 2019-05-11 LAB — CBC WITH DIFFERENTIAL/PLATELET
Abs Immature Granulocytes: 0.02 10*3/uL (ref 0.00–0.07)
Basophils Absolute: 0 10*3/uL (ref 0.0–0.1)
Basophils Relative: 1 %
Eosinophils Absolute: 0.1 10*3/uL (ref 0.0–0.5)
Eosinophils Relative: 2 %
HCT: 31.3 % — ABNORMAL LOW (ref 36.0–46.0)
Hemoglobin: 10.1 g/dL — ABNORMAL LOW (ref 12.0–15.0)
Immature Granulocytes: 0 %
Lymphocytes Relative: 15 %
Lymphs Abs: 0.9 10*3/uL (ref 0.7–4.0)
MCH: 29.8 pg (ref 26.0–34.0)
MCHC: 32.3 g/dL (ref 30.0–36.0)
MCV: 92.3 fL (ref 80.0–100.0)
Monocytes Absolute: 0.4 10*3/uL (ref 0.1–1.0)
Monocytes Relative: 6 %
Neutro Abs: 4.2 10*3/uL (ref 1.7–7.7)
Neutrophils Relative %: 76 %
Platelets: 275 10*3/uL (ref 150–400)
RBC: 3.39 MIL/uL — ABNORMAL LOW (ref 3.87–5.11)
RDW: 19.6 % — ABNORMAL HIGH (ref 11.5–15.5)
WBC: 5.6 10*3/uL (ref 4.0–10.5)
nRBC: 0 % (ref 0.0–0.2)

## 2019-05-11 LAB — BASIC METABOLIC PANEL
Anion gap: 8 (ref 5–15)
BUN: <5 mg/dL — ABNORMAL LOW (ref 8–23)
CO2: 28 mmol/L (ref 22–32)
Calcium: 8.6 mg/dL — ABNORMAL LOW (ref 8.9–10.3)
Chloride: 102 mmol/L (ref 98–111)
Creatinine, Ser: 0.75 mg/dL (ref 0.44–1.00)
GFR calc Af Amer: >60 mL/min (ref >60)
GFR calc non Af Amer: >60 mL/min (ref >60)
Glucose, Bld: 122 mg/dL — ABNORMAL HIGH (ref 70–99)
Potassium: 3.2 mmol/L — ABNORMAL LOW (ref 3.5–5.1)
Sodium: 138 mmol/L (ref 135–145)

## 2019-05-11 LAB — GLUCOSE, CAPILLARY
Glucose-Capillary: 123 mg/dL — ABNORMAL HIGH (ref 70–99)
Glucose-Capillary: 152 mg/dL — ABNORMAL HIGH (ref 70–99)
Glucose-Capillary: 109 mg/dL — ABNORMAL HIGH (ref 70–99)
Glucose-Capillary: 139 mg/dL — ABNORMAL HIGH (ref 70–99)

## 2019-05-11 MED ORDER — CHLORHEXIDINE GLUCONATE CLOTH 2 % EX PADS
6.0000 | MEDICATED_PAD | Freq: Every day | CUTANEOUS | Status: DC
  Administered 2019-05-11 – 2019-05-12 (×2): 6 via TOPICAL

## 2019-05-11 MED ORDER — MUPIROCIN 2 % EX OINT
1.0000 "application " | TOPICAL_OINTMENT | Freq: Two times a day (BID) | CUTANEOUS | Status: DC
  Administered 2019-05-11 – 2019-05-12 (×3): 1 via NASAL
  Filled 2019-05-11: qty 22

## 2019-05-11 MED ORDER — IPRATROPIUM-ALBUTEROL 0.5-2.5 (3) MG/3ML IN SOLN
3.0000 mL | Freq: Once | RESPIRATORY_TRACT | Status: AC
  Administered 2019-05-11: 3 mL via RESPIRATORY_TRACT
  Filled 2019-05-11: qty 3

## 2019-05-11 NOTE — Progress Notes (Signed)
Modified Barium Swallow Progress Note  Patient Details  Name: Karla Clark MRN: [id number] Date of Birth: [birth year]  Today's Date: 05/11/2019  Modified Barium Swallow completed.  Full report located under Chart Review in the Imaging Section.  Brief recommendations include the following:  Clinical Impression  Modified Barium Swallow completed to objectively assess patient's swallow function and provide safe diet recommendations and compensatory strategies. Pt exhibits mild to moderate oropharyngeal dysphagia. Incomplete mastication noted on soft solids, delayed oral transit with all bolus types and premature spillage of bolus to the pyriform sinuses. Thin liquid was squeezed into the airway during the swallow, passes below the cords, silently aspirated. Pt was verbally cued to cough, but was unable to initiate cough. Pt was unable to follow directions for modification during MBS. Recommend  Dys 2, nectar thickened liquid diet. Medications can be given whole in puree. ST to follow pt for diet tolerance. At this time pt appears to be a poor candidate for swallow therapy, as she is unable to perform compensatory strategies during MBS. Would recommend repeat MBS to upgrade liquids to thin. Pt has had 2 episodes of PNA in the last 2 months.    Swallow Evaluation Recommendations       SLP Diet Recommendations: Dysphagia 3 (Mech soft) solids;Nectar thick liquid   Liquid Administration via: Cup;Straw   Medication Administration: Whole meds with puree   Supervision: Patient able to self feed;Staff to assist with self feeding   Compensations: Minimize environmental distractions;Slow rate;Small sips/bites   Postural Changes: Seated upright at 90 degrees   Oral Care Recommendations: Oral care BID   Other Recommendations: Order thickener from pharmacy    Luis Abed., MA, CCC-SLP 05/11/2019,1:16 PM

## 2019-05-11 NOTE — Progress Notes (Signed)
Re-evaluated patient at bedside. She notes difficulty taking a deep breathe in on examination. She also endorses vaginal itchiness that has been present for a while now. The itchiness is causing her discomfort. No prior c/o of vaginal itching, per chart review.   Inspiratory wheezing auscultated on the right with decreased breathe sounds on the left. No respiratory distress. No accessory muscle use. In no acute distress.   A/P:  Reviewed results of MBS that showed aspiration of thin liquids, as well as inability to initiate cough. Consistent with repeat CXR that shows persistent consolidation in the LLL with new hazy infiltrate in left middle zone. Given worsening in CXR, concerning for continued aspiration. No new oxygen requirement, which is reassuring though. Plan to continue observation overnight with plan to discharge tomorrow if condition is stable. Discussed plan with daughter. All questions and concerns addressed.   - Dysphagia 2 diet  - Aspiration precautions  - Continue Unasyn - Continue Breo and Incruse  - Monitor vital signs    Dr. Verdene Lennert Internal Medicine PGY-1  Pager: [phone] 05/11/2019, 2:13 PM

## 2019-05-11 NOTE — Progress Notes (Addendum)
   Subjective: Pt states she continues to breathe well today and denies SOB, however she does complain of wheezing as well as feeling hot and sweating. She endorses some coughing as well. She denies fever or abdominal pain.   Objective:  Vital signs in last 24 hours: Vitals:   05/10/19 1223 05/10/19 2137 05/11/19 0602 05/11/19 1025  BP: (!) 115/53 137/83 (!) 128/43 (!) 131/53  Pulse: 86 93 87 84  Resp: 16 15 16 16   Temp: 98 F (36.7 C) 98.5 F (36.9 C) 98.2 F (36.8 C) 98.5 F (36.9 C)  TempSrc: Oral Oral Oral Oral  SpO2: 96% 96% 94% 97%  Weight:      Height:       Weight change:   Intake/Output Summary (Last 24 hours) at 05/11/2019 1050 Last data filed at 05/11/2019 0300 Gross per 24 hour  Intake 450.42 ml  Output 750 ml  Net -299.58 ml    Physical Exam: Gen: NAD, resting in bed.  CV: RRR no m/g/r Pulm: Wheezes present upon ascultation. Audible wheezes heard when she is positioned in sitting position for auscultation. Normal WOB while resting. Abdomen: soft nd. Mild abdominal pain present in LUQ and RUQ. MSK: Moving all extremities spontaneously Skin: no rashes, warm, moist. Neuro: AO, no focal deficits Psych: affect appropriate   Assessment/Plan:  Active Problems:   Acute on chronic respiratory failure with hypoxia (HCC)   AKI (acute kidney injury) (HCC)   Acute respiratory failure with hypoxia (HCC)   LOS: 1 day   Likely aspiration pneumonia Upon exam pt noted to have wheezing as well as warm, moist skin, continuing to sat well on RA. She has been afebrile throughout the day however given hx of aspiration and presentation we will continue to cover for pneumonia. We will repeat CXR and BMP/CBC today should to follow up on earlier studies. If studies return with concern for inefection she will be able to be discharged. She is on Unasyn however will switch to Augmentin to continue to cover for if she leaves today.  - Nebulizer treatment today - repeat CXR -  continue unasyn/ switch po Augmentin should she be discharged - duonebs q6h  - CBC/CMP - BCx negative - possible discharge today pending CXR/labs she maintain sats/remain asymptomatic - barium swallow pending  Diastolic Heart Failure Continued to show no evidence of acute exacerbation.  - hold lasix  Type II DM - started SSI  Acute Kidney Injury  Hypokalemia Cr 1.5 on admit, 0.7 Appears to largely have resolved. Will follow up her creatinine today. - BMP  Dysphagia Per SLP she has mild to moderate dysphagia and classifies her as a moderate aspiration risk. Recommend patient proceed with MBS to evaluate - Barium swallow ordered - Cont dysphagia 3 diet.    Schizophrenia - continue geodon 40 mg qhs - restart venlafaxine  Hypothyroidism Controlled, continue synthroid   Diet - dysphagia 3 diet Fluids - none DVT ppx -  enoxaparin Bowel Regimen - none   Code Status: Full Code    07/09/2019, Medical Student 05/11/2019, 10:50 AM Pager: [phone]

## 2019-05-12 LAB — GLUCOSE, CAPILLARY
Glucose-Capillary: 166 mg/dL — ABNORMAL HIGH (ref 70–99)
Glucose-Capillary: 166 mg/dL — ABNORMAL HIGH (ref 70–99)

## 2019-05-12 MED ORDER — AMOXICILLIN-POT CLAVULANATE 875-125 MG PO TABS: 1.0000 | ORAL_TABLET | Freq: Two times a day (BID) | ORAL | 0 refills | Status: AC

## 2019-05-12 NOTE — Progress Notes (Signed)
   Subjective: No acute events overnight. Karla Clark is feeling well this morning. Denies fevers, chills, cough or difficulty breathing. She was able to tell me the aspiration precautions SLP taught her yesterday. Discussed plan for discharge home today which she was agreeable with .  Objective:  Vital signs in last 24 hours: Vitals:   05/11/19 1025 05/11/19 1623 05/12/19 0014 05/12/19 0544  BP: (!) 131/53 (!) 122/93 126/70 (!) 128/56  Pulse: 84 69 87 87  Resp: 16 18 16 16   Temp: 98.5 F (36.9 C) 98 F (36.7 C) 97.8 F (36.6 C) 97.8 F (36.6 C)  TempSrc: Oral Oral Oral Oral  SpO2: 97% 93% 97% 92%  Weight:    71.3 kg  Height:       General: awake, alert, pleasant female sitting up in bed in NAD CV: RRR Pulm: normal work of breathing; scattered expiratory wheezes; no crackles  Abd: soft, non-distended, non-tender Ext: no edema   Assessment/Plan:  Active Problems:   Acute on chronic respiratory failure with hypoxia (HCC)   AKI (acute kidney injury) (HCC)   Acute respiratory failure with hypoxia (HCC)  Acute on chronic hypoxic respiratory failure 2/2 to aspiration pneumonia - patient remained afebrile and on room air overnight - currently on unasyn for aspiration coverage; will transition to PO Augmentin to complete 7 day course - clinically improved compared to yesterday and stable for d/c  - will need to continue aspiration precautions and have close monitoring while eating at home   COPD - mild wheezing on exam today, but notes breathing is at her baseline - will continue home meds   HFpEF - no evidence of volume overload - AKI resolved; will resume PO lasix on discharge   Dispo: Anticipated discharge home today.   D, DO 05/12/2019, 7:03 AM Pager: [phone]

## 2019-05-12 NOTE — Progress Notes (Signed)
Pt IV removed, catheter intact  Pt has all belongings Discharge education provided to pt daughter, Virl Axe Pt discharged via wheelchair with NT

## 2019-05-12 NOTE — Care Management (Signed)
Faxed DC summary and HH orders to [facility] at [phone]. No other CM needs identified.

## 2019-05-12 NOTE — Discharge Summary (Signed)
Name: Karla Clark MRN: [id number] DOB: 1945/10/17 74 y.o. PCP: Charlott Rakes, MD  Date of Admission: 05/09/2019  6:15 AM Date of Discharge: 05/12/2019 Attending Physician: Earl Lagos, MD  Discharge Diagnosis: 1. Acute on chronic hypoxic respiratory failure 2/2 aspiration pneumonia   Discharge Medications: Allergies as of 05/12/2019      Reactions   Ace Inhibitors Cough      Medication List    TAKE these medications   albuterol (2.5 MG/3ML) 0.083% nebulizer solution Commonly known as: PROVENTIL Take 2.5 mg by nebulization every 6 (six) hours as needed for wheezing or shortness of breath.   amoxicillin-clavulanate 875-125 MG tablet Commonly known as: Augmentin Take 1 tablet by mouth 2 (two) times daily for 4 days.   ferrous sulfate 325 (65 FE) MG tablet Take 1 tablet (325 mg total) by mouth daily.   folic acid 1 MG tablet Commonly known as: FOLVITE TAKE 1 TABLET BY MOUTH EVERY DAY   furosemide 40 MG tablet Commonly known as: Lasix Take 1 tablet (40 mg total) by mouth daily.   gabapentin 600 MG tablet Commonly known as: NEURONTIN Take 1,200 mg by mouth 2 (two) times daily.   ipratropium-albuterol 0.5-2.5 (3) MG/3ML Soln Commonly known as: DUONEB Take 3 mLs by nebulization every 6 (six) hours as needed (shortness of breath).   levothyroxine 75 MCG tablet Commonly known as: SYNTHROID Take 75 mcg by mouth daily.   losartan 100 MG tablet Commonly known as: COZAAR Take 100 mg by mouth daily.   metFORMIN 500 MG 24 hr tablet Commonly known as: GLUCOPHAGE-XR Take 500 mg by mouth 2 (two) times daily.   nystatin powder Generic drug: nystatin Apply 1 g topically 2 (two) times daily as needed (rash).   pantoprazole 40 MG tablet Commonly known as: PROTONIX Take 40 mg by mouth daily.   pioglitazone 30 MG tablet Commonly known as: ACTOS Take 30 mg by mouth daily.   Potassium Chloride ER 20 MEQ Tbcr Take 20 mEq by mouth daily.   simvastatin 40 MG  tablet Commonly known as: ZOCOR Take 40 mg by mouth daily at 6 PM.   Trelegy Ellipta 100-62.5-25 MCG/INH Aepb Generic drug: Fluticasone-Umeclidin-Vilant Inhale 1 puff into the lungs daily.   venlafaxine XR 150 MG 24 hr capsule Commonly known as: EFFEXOR-XR Take 150 mg by mouth every morning.   ziprasidone 40 MG capsule Commonly known as: GEODON Take 40 mg by mouth at bedtime.       Disposition and follow-up:   KarlaKarla Clark was discharged from [facility] in Good condition.  At the hospital follow up visit please address:  1.  Aspiration pneumonia: ensure she has completed full course antibiotics. Continue to educate on aspiration precautions and ensure family closely monitors her while eating.  2.  Labs / imaging needed at time of follow-up: none  3.  Pending labs/ test needing follow-up: none  Follow-up Appointments: Follow-up Information    [facility] Follow up.   Specialty: Home Health Services Contact information: [address] [place] [address] [phone]           Hospital Course by problem list: Ms. Karla Clark is a 74 y/o female with COPD, RA, hypothyroidism, HTN, TIIDM who presented with progressive cough and shortness of breath. She was found to have new oxygen requirement of 3L. Work-up significant for new left lower lobe opacity.   1. Acute on chronic hypoxic respiratory failure: She was initially started on coverage for community  required pneumonia. However, she was found to have oropharyngeal dysphagia on swallow eval. This finding, along with her recent pneumonia a month ago raised suspicion for aspiration pneumonia. She was subsequently transitioned to [facility] for adequate coverage. Patient improved over the next couple of days and was able to wean successfully to room air. She and her daughter were educated on aspiration precautions and recommended diet based on her swallow evaluation. She was discharged on  Augmentin to complete 7 day course.   2. COPD: She did not show any signs of an acute exacerbation. We continued her home albuterol and trelegy.   3. HFpEF: she did not have any evidence of volume overload. She had an AKI on admission, which resolved with IVF. We were able to resume her lasix on discharge.     Discharge Vitals:   BP 122/63 (BP Location: Left Arm)   Pulse 100   Temp 97.8 F (36.6 C) (Oral)   Resp 16   Ht 5\' 2"  (1.575 m)   Wt 71.3 kg   SpO2 94%   BMI 28.75 kg/m   Pertinent Labs, Studies, and Procedures:  CXR IMPRESSION: 1. Persistent consolidation at the left lung base with air bronchograms in the right midzone. 2. New area of hazy infiltrate in the left midzone.  BMP Latest Ref Rng & Units 05/11/2019 05/10/2019 05/09/2019  Glucose 70 - 99 mg/dL 122(H) 263(H) -  BUN 8 - 23 mg/dL <5(L) 10 -  Creatinine 0.44 - 1.00 mg/dL 0.75 0.70 -  Sodium 135 - 145 mmol/L 138 138 138  Potassium 3.5 - 5.1 mmol/L 3.2(L) 3.3(L) 2.8(L)  Chloride 98 - 111 mmol/L 102 100 -  CO2 22 - 32 mmol/L 28 27 -  Calcium 8.9 - 10.3 mg/dL 8.6(L) 8.4(L) -     Discharge Instructions: Discharge Instructions    Call MD for:  difficulty breathing, headache or visual disturbances   Complete by: As directed    Call MD for:  extreme fatigue   Complete by: As directed    Call MD for:  persistant dizziness or light-headedness   Complete by: As directed    Call MD for:  temperature >100.4   Complete by: As directed    Diet - low sodium heart healthy   Complete by: As directed    Discharge instructions   Complete by: As directed    Karla Clark, It was a pleasure taking care of you. You were treated in the hospital for aspiration pneumonia. Please continue taking the antibiotics twice daily for 4 more days. As we discussed it will be important that you are extra careful when eating or drinking anything. Be sure to sit up all the way, take small bites, and try to drink thickened liquids to try and avoid  anything getting into your lungs.   Take care!   Increase activity slowly   Complete by: As directed       Signed: Delice Bison, DO 05/12/2019, 10:13 AM   Pager: [phone]

## 2019-05-14 LAB — CULTURE, BLOOD (ROUTINE X 2)
Culture: NO GROWTH
Culture: NO GROWTH
Special Requests: ADEQUATE

## 2019-05-15 DIAGNOSIS — J441 Chronic obstructive pulmonary disease with (acute) exacerbation: Secondary | ICD-10-CM | POA: Diagnosis not present

## 2019-05-15 DIAGNOSIS — D509 Iron deficiency anemia, unspecified: Secondary | ICD-10-CM | POA: Diagnosis not present

## 2019-05-15 DIAGNOSIS — E876 Hypokalemia: Secondary | ICD-10-CM | POA: Diagnosis not present

## 2019-05-15 DIAGNOSIS — I110 Hypertensive heart disease with heart failure: Secondary | ICD-10-CM | POA: Diagnosis not present

## 2019-05-15 DIAGNOSIS — J690 Pneumonitis due to inhalation of food and vomit: Secondary | ICD-10-CM | POA: Diagnosis not present

## 2019-05-15 DIAGNOSIS — Z9181 History of falling: Secondary | ICD-10-CM | POA: Diagnosis not present

## 2019-05-15 DIAGNOSIS — M0579 Rheumatoid arthritis with rheumatoid factor of multiple sites without organ or systems involvement: Secondary | ICD-10-CM | POA: Diagnosis not present

## 2019-05-15 DIAGNOSIS — M159 Polyosteoarthritis, unspecified: Secondary | ICD-10-CM | POA: Diagnosis not present

## 2019-05-15 DIAGNOSIS — I5032 Chronic diastolic (congestive) heart failure: Secondary | ICD-10-CM | POA: Diagnosis not present

## 2019-05-15 DIAGNOSIS — Z87891 Personal history of nicotine dependence: Secondary | ICD-10-CM | POA: Diagnosis not present

## 2019-05-15 DIAGNOSIS — J440 Chronic obstructive pulmonary disease with acute lower respiratory infection: Secondary | ICD-10-CM | POA: Diagnosis not present

## 2019-05-15 DIAGNOSIS — J9621 Acute and chronic respiratory failure with hypoxia: Secondary | ICD-10-CM | POA: Diagnosis not present

## 2019-05-15 DIAGNOSIS — N179 Acute kidney failure, unspecified: Secondary | ICD-10-CM | POA: Diagnosis not present

## 2019-05-15 DIAGNOSIS — E785 Hyperlipidemia, unspecified: Secondary | ICD-10-CM | POA: Diagnosis not present

## 2019-05-15 DIAGNOSIS — I959 Hypotension, unspecified: Secondary | ICD-10-CM | POA: Diagnosis not present

## 2019-05-15 DIAGNOSIS — E039 Hypothyroidism, unspecified: Secondary | ICD-10-CM | POA: Diagnosis not present

## 2019-05-15 DIAGNOSIS — E119 Type 2 diabetes mellitus without complications: Secondary | ICD-10-CM | POA: Diagnosis not present

## 2019-05-15 DIAGNOSIS — Z7984 Long term (current) use of oral hypoglycemic drugs: Secondary | ICD-10-CM | POA: Diagnosis not present

## 2019-05-15 DIAGNOSIS — R1312 Dysphagia, oropharyngeal phase: Secondary | ICD-10-CM | POA: Diagnosis not present

## 2019-05-15 DIAGNOSIS — Z87440 Personal history of urinary (tract) infections: Secondary | ICD-10-CM | POA: Diagnosis not present

## 2019-05-15 DIAGNOSIS — G9341 Metabolic encephalopathy: Secondary | ICD-10-CM | POA: Diagnosis not present

## 2019-05-17 DIAGNOSIS — I110 Hypertensive heart disease with heart failure: Secondary | ICD-10-CM | POA: Diagnosis not present

## 2019-05-17 DIAGNOSIS — Z87440 Personal history of urinary (tract) infections: Secondary | ICD-10-CM | POA: Diagnosis not present

## 2019-05-17 DIAGNOSIS — E039 Hypothyroidism, unspecified: Secondary | ICD-10-CM | POA: Diagnosis not present

## 2019-05-17 DIAGNOSIS — M0579 Rheumatoid arthritis with rheumatoid factor of multiple sites without organ or systems involvement: Secondary | ICD-10-CM | POA: Diagnosis not present

## 2019-05-17 DIAGNOSIS — Z9181 History of falling: Secondary | ICD-10-CM | POA: Diagnosis not present

## 2019-05-17 DIAGNOSIS — J441 Chronic obstructive pulmonary disease with (acute) exacerbation: Secondary | ICD-10-CM | POA: Diagnosis not present

## 2019-05-17 DIAGNOSIS — I959 Hypotension, unspecified: Secondary | ICD-10-CM | POA: Diagnosis not present

## 2019-05-17 DIAGNOSIS — N179 Acute kidney failure, unspecified: Secondary | ICD-10-CM | POA: Diagnosis not present

## 2019-05-17 DIAGNOSIS — J440 Chronic obstructive pulmonary disease with acute lower respiratory infection: Secondary | ICD-10-CM | POA: Diagnosis not present

## 2019-05-17 DIAGNOSIS — J690 Pneumonitis due to inhalation of food and vomit: Secondary | ICD-10-CM | POA: Diagnosis not present

## 2019-05-17 DIAGNOSIS — I5032 Chronic diastolic (congestive) heart failure: Secondary | ICD-10-CM | POA: Diagnosis not present

## 2019-05-17 DIAGNOSIS — R1312 Dysphagia, oropharyngeal phase: Secondary | ICD-10-CM | POA: Diagnosis not present

## 2019-05-17 DIAGNOSIS — G9341 Metabolic encephalopathy: Secondary | ICD-10-CM | POA: Diagnosis not present

## 2019-05-17 DIAGNOSIS — Z87891 Personal history of nicotine dependence: Secondary | ICD-10-CM | POA: Diagnosis not present

## 2019-05-17 DIAGNOSIS — E876 Hypokalemia: Secondary | ICD-10-CM | POA: Diagnosis not present

## 2019-05-17 DIAGNOSIS — Z7984 Long term (current) use of oral hypoglycemic drugs: Secondary | ICD-10-CM | POA: Diagnosis not present

## 2019-05-17 DIAGNOSIS — D509 Iron deficiency anemia, unspecified: Secondary | ICD-10-CM | POA: Diagnosis not present

## 2019-05-17 DIAGNOSIS — J9621 Acute and chronic respiratory failure with hypoxia: Secondary | ICD-10-CM | POA: Diagnosis not present

## 2019-05-17 DIAGNOSIS — E785 Hyperlipidemia, unspecified: Secondary | ICD-10-CM | POA: Diagnosis not present

## 2019-05-17 DIAGNOSIS — E119 Type 2 diabetes mellitus without complications: Secondary | ICD-10-CM | POA: Diagnosis not present

## 2019-05-17 DIAGNOSIS — M159 Polyosteoarthritis, unspecified: Secondary | ICD-10-CM | POA: Diagnosis not present

## 2019-05-22 DIAGNOSIS — E785 Hyperlipidemia, unspecified: Secondary | ICD-10-CM | POA: Diagnosis not present

## 2019-05-22 DIAGNOSIS — Z7984 Long term (current) use of oral hypoglycemic drugs: Secondary | ICD-10-CM | POA: Diagnosis not present

## 2019-05-22 DIAGNOSIS — Z87891 Personal history of nicotine dependence: Secondary | ICD-10-CM | POA: Diagnosis not present

## 2019-05-22 DIAGNOSIS — J9621 Acute and chronic respiratory failure with hypoxia: Secondary | ICD-10-CM | POA: Diagnosis not present

## 2019-05-22 DIAGNOSIS — I5032 Chronic diastolic (congestive) heart failure: Secondary | ICD-10-CM | POA: Diagnosis not present

## 2019-05-22 DIAGNOSIS — I959 Hypotension, unspecified: Secondary | ICD-10-CM | POA: Diagnosis not present

## 2019-05-22 DIAGNOSIS — M159 Polyosteoarthritis, unspecified: Secondary | ICD-10-CM | POA: Diagnosis not present

## 2019-05-22 DIAGNOSIS — E876 Hypokalemia: Secondary | ICD-10-CM | POA: Diagnosis not present

## 2019-05-22 DIAGNOSIS — J440 Chronic obstructive pulmonary disease with acute lower respiratory infection: Secondary | ICD-10-CM | POA: Diagnosis not present

## 2019-05-22 DIAGNOSIS — E119 Type 2 diabetes mellitus without complications: Secondary | ICD-10-CM | POA: Diagnosis not present

## 2019-05-22 DIAGNOSIS — E039 Hypothyroidism, unspecified: Secondary | ICD-10-CM | POA: Diagnosis not present

## 2019-05-22 DIAGNOSIS — J690 Pneumonitis due to inhalation of food and vomit: Secondary | ICD-10-CM | POA: Diagnosis not present

## 2019-05-22 DIAGNOSIS — I110 Hypertensive heart disease with heart failure: Secondary | ICD-10-CM | POA: Diagnosis not present

## 2019-05-22 DIAGNOSIS — M0579 Rheumatoid arthritis with rheumatoid factor of multiple sites without organ or systems involvement: Secondary | ICD-10-CM | POA: Diagnosis not present

## 2019-05-22 DIAGNOSIS — Z87440 Personal history of urinary (tract) infections: Secondary | ICD-10-CM | POA: Diagnosis not present

## 2019-05-22 DIAGNOSIS — J441 Chronic obstructive pulmonary disease with (acute) exacerbation: Secondary | ICD-10-CM | POA: Diagnosis not present

## 2019-05-22 DIAGNOSIS — Z9181 History of falling: Secondary | ICD-10-CM | POA: Diagnosis not present

## 2019-05-22 DIAGNOSIS — D509 Iron deficiency anemia, unspecified: Secondary | ICD-10-CM | POA: Diagnosis not present

## 2019-05-22 DIAGNOSIS — N179 Acute kidney failure, unspecified: Secondary | ICD-10-CM | POA: Diagnosis not present

## 2019-05-22 DIAGNOSIS — G9341 Metabolic encephalopathy: Secondary | ICD-10-CM | POA: Diagnosis not present

## 2019-05-22 DIAGNOSIS — R1312 Dysphagia, oropharyngeal phase: Secondary | ICD-10-CM | POA: Diagnosis not present

## 2019-05-23 DIAGNOSIS — I959 Hypotension, unspecified: Secondary | ICD-10-CM | POA: Diagnosis not present

## 2019-05-23 DIAGNOSIS — E876 Hypokalemia: Secondary | ICD-10-CM | POA: Diagnosis not present

## 2019-05-23 DIAGNOSIS — E785 Hyperlipidemia, unspecified: Secondary | ICD-10-CM | POA: Diagnosis not present

## 2019-05-23 DIAGNOSIS — E039 Hypothyroidism, unspecified: Secondary | ICD-10-CM | POA: Diagnosis not present

## 2019-05-23 DIAGNOSIS — I110 Hypertensive heart disease with heart failure: Secondary | ICD-10-CM | POA: Diagnosis not present

## 2019-05-23 DIAGNOSIS — R1312 Dysphagia, oropharyngeal phase: Secondary | ICD-10-CM | POA: Diagnosis not present

## 2019-05-23 DIAGNOSIS — G9341 Metabolic encephalopathy: Secondary | ICD-10-CM | POA: Diagnosis not present

## 2019-05-23 DIAGNOSIS — M0579 Rheumatoid arthritis with rheumatoid factor of multiple sites without organ or systems involvement: Secondary | ICD-10-CM | POA: Diagnosis not present

## 2019-05-23 DIAGNOSIS — J9621 Acute and chronic respiratory failure with hypoxia: Secondary | ICD-10-CM | POA: Diagnosis not present

## 2019-05-23 DIAGNOSIS — Z9181 History of falling: Secondary | ICD-10-CM | POA: Diagnosis not present

## 2019-05-23 DIAGNOSIS — Z87891 Personal history of nicotine dependence: Secondary | ICD-10-CM | POA: Diagnosis not present

## 2019-05-23 DIAGNOSIS — M159 Polyosteoarthritis, unspecified: Secondary | ICD-10-CM | POA: Diagnosis not present

## 2019-05-23 DIAGNOSIS — N179 Acute kidney failure, unspecified: Secondary | ICD-10-CM | POA: Diagnosis not present

## 2019-05-23 DIAGNOSIS — Z87440 Personal history of urinary (tract) infections: Secondary | ICD-10-CM | POA: Diagnosis not present

## 2019-05-23 DIAGNOSIS — I5032 Chronic diastolic (congestive) heart failure: Secondary | ICD-10-CM | POA: Diagnosis not present

## 2019-05-23 DIAGNOSIS — J690 Pneumonitis due to inhalation of food and vomit: Secondary | ICD-10-CM | POA: Diagnosis not present

## 2019-05-23 DIAGNOSIS — E119 Type 2 diabetes mellitus without complications: Secondary | ICD-10-CM | POA: Diagnosis not present

## 2019-05-23 DIAGNOSIS — Z7984 Long term (current) use of oral hypoglycemic drugs: Secondary | ICD-10-CM | POA: Diagnosis not present

## 2019-05-23 DIAGNOSIS — D509 Iron deficiency anemia, unspecified: Secondary | ICD-10-CM | POA: Diagnosis not present

## 2019-05-23 DIAGNOSIS — J441 Chronic obstructive pulmonary disease with (acute) exacerbation: Secondary | ICD-10-CM | POA: Diagnosis not present

## 2019-05-23 DIAGNOSIS — J440 Chronic obstructive pulmonary disease with acute lower respiratory infection: Secondary | ICD-10-CM | POA: Diagnosis not present

## 2019-05-24 DIAGNOSIS — G9341 Metabolic encephalopathy: Secondary | ICD-10-CM | POA: Diagnosis not present

## 2019-05-24 DIAGNOSIS — I959 Hypotension, unspecified: Secondary | ICD-10-CM | POA: Diagnosis not present

## 2019-05-24 DIAGNOSIS — Z7984 Long term (current) use of oral hypoglycemic drugs: Secondary | ICD-10-CM | POA: Diagnosis not present

## 2019-05-24 DIAGNOSIS — J9621 Acute and chronic respiratory failure with hypoxia: Secondary | ICD-10-CM | POA: Diagnosis not present

## 2019-05-24 DIAGNOSIS — R1312 Dysphagia, oropharyngeal phase: Secondary | ICD-10-CM | POA: Diagnosis not present

## 2019-05-24 DIAGNOSIS — I5032 Chronic diastolic (congestive) heart failure: Secondary | ICD-10-CM | POA: Diagnosis not present

## 2019-05-24 DIAGNOSIS — M159 Polyosteoarthritis, unspecified: Secondary | ICD-10-CM | POA: Diagnosis not present

## 2019-05-24 DIAGNOSIS — D509 Iron deficiency anemia, unspecified: Secondary | ICD-10-CM | POA: Diagnosis not present

## 2019-05-24 DIAGNOSIS — Z87891 Personal history of nicotine dependence: Secondary | ICD-10-CM | POA: Diagnosis not present

## 2019-05-24 DIAGNOSIS — Z87440 Personal history of urinary (tract) infections: Secondary | ICD-10-CM | POA: Diagnosis not present

## 2019-05-24 DIAGNOSIS — J441 Chronic obstructive pulmonary disease with (acute) exacerbation: Secondary | ICD-10-CM | POA: Diagnosis not present

## 2019-05-24 DIAGNOSIS — E039 Hypothyroidism, unspecified: Secondary | ICD-10-CM | POA: Diagnosis not present

## 2019-05-24 DIAGNOSIS — E785 Hyperlipidemia, unspecified: Secondary | ICD-10-CM | POA: Diagnosis not present

## 2019-05-24 DIAGNOSIS — J690 Pneumonitis due to inhalation of food and vomit: Secondary | ICD-10-CM | POA: Diagnosis not present

## 2019-05-24 DIAGNOSIS — N179 Acute kidney failure, unspecified: Secondary | ICD-10-CM | POA: Diagnosis not present

## 2019-05-24 DIAGNOSIS — J440 Chronic obstructive pulmonary disease with acute lower respiratory infection: Secondary | ICD-10-CM | POA: Diagnosis not present

## 2019-05-24 DIAGNOSIS — M0579 Rheumatoid arthritis with rheumatoid factor of multiple sites without organ or systems involvement: Secondary | ICD-10-CM | POA: Diagnosis not present

## 2019-05-24 DIAGNOSIS — E119 Type 2 diabetes mellitus without complications: Secondary | ICD-10-CM | POA: Diagnosis not present

## 2019-05-24 DIAGNOSIS — Z9181 History of falling: Secondary | ICD-10-CM | POA: Diagnosis not present

## 2019-05-24 DIAGNOSIS — I110 Hypertensive heart disease with heart failure: Secondary | ICD-10-CM | POA: Diagnosis not present

## 2019-05-24 DIAGNOSIS — E876 Hypokalemia: Secondary | ICD-10-CM | POA: Diagnosis not present

## 2019-05-25 DIAGNOSIS — J449 Chronic obstructive pulmonary disease, unspecified: Secondary | ICD-10-CM | POA: Diagnosis not present

## 2019-05-25 DIAGNOSIS — I509 Heart failure, unspecified: Secondary | ICD-10-CM | POA: Diagnosis not present

## 2019-05-25 DIAGNOSIS — J9690 Respiratory failure, unspecified, unspecified whether with hypoxia or hypercapnia: Secondary | ICD-10-CM | POA: Diagnosis not present

## 2019-05-25 DIAGNOSIS — J9811 Atelectasis: Secondary | ICD-10-CM | POA: Diagnosis not present

## 2019-05-28 DIAGNOSIS — Z9181 History of falling: Secondary | ICD-10-CM | POA: Diagnosis not present

## 2019-05-28 DIAGNOSIS — E876 Hypokalemia: Secondary | ICD-10-CM | POA: Diagnosis not present

## 2019-05-28 DIAGNOSIS — Z87440 Personal history of urinary (tract) infections: Secondary | ICD-10-CM | POA: Diagnosis not present

## 2019-05-28 DIAGNOSIS — I110 Hypertensive heart disease with heart failure: Secondary | ICD-10-CM | POA: Diagnosis not present

## 2019-05-28 DIAGNOSIS — J9621 Acute and chronic respiratory failure with hypoxia: Secondary | ICD-10-CM | POA: Diagnosis not present

## 2019-05-28 DIAGNOSIS — D509 Iron deficiency anemia, unspecified: Secondary | ICD-10-CM | POA: Diagnosis not present

## 2019-05-28 DIAGNOSIS — E119 Type 2 diabetes mellitus without complications: Secondary | ICD-10-CM | POA: Diagnosis not present

## 2019-05-28 DIAGNOSIS — M159 Polyosteoarthritis, unspecified: Secondary | ICD-10-CM | POA: Diagnosis not present

## 2019-05-28 DIAGNOSIS — J440 Chronic obstructive pulmonary disease with acute lower respiratory infection: Secondary | ICD-10-CM | POA: Diagnosis not present

## 2019-05-28 DIAGNOSIS — I959 Hypotension, unspecified: Secondary | ICD-10-CM | POA: Diagnosis not present

## 2019-05-28 DIAGNOSIS — R1312 Dysphagia, oropharyngeal phase: Secondary | ICD-10-CM | POA: Diagnosis not present

## 2019-05-28 DIAGNOSIS — Z87891 Personal history of nicotine dependence: Secondary | ICD-10-CM | POA: Diagnosis not present

## 2019-05-28 DIAGNOSIS — N179 Acute kidney failure, unspecified: Secondary | ICD-10-CM | POA: Diagnosis not present

## 2019-05-28 DIAGNOSIS — J690 Pneumonitis due to inhalation of food and vomit: Secondary | ICD-10-CM | POA: Diagnosis not present

## 2019-05-28 DIAGNOSIS — M0579 Rheumatoid arthritis with rheumatoid factor of multiple sites without organ or systems involvement: Secondary | ICD-10-CM | POA: Diagnosis not present

## 2019-05-28 DIAGNOSIS — I5032 Chronic diastolic (congestive) heart failure: Secondary | ICD-10-CM | POA: Diagnosis not present

## 2019-05-28 DIAGNOSIS — J441 Chronic obstructive pulmonary disease with (acute) exacerbation: Secondary | ICD-10-CM | POA: Diagnosis not present

## 2019-05-28 DIAGNOSIS — E785 Hyperlipidemia, unspecified: Secondary | ICD-10-CM | POA: Diagnosis not present

## 2019-05-28 DIAGNOSIS — G9341 Metabolic encephalopathy: Secondary | ICD-10-CM | POA: Diagnosis not present

## 2019-05-28 DIAGNOSIS — E039 Hypothyroidism, unspecified: Secondary | ICD-10-CM | POA: Diagnosis not present

## 2019-05-28 DIAGNOSIS — Z7984 Long term (current) use of oral hypoglycemic drugs: Secondary | ICD-10-CM | POA: Diagnosis not present

## 2019-05-29 DIAGNOSIS — E785 Hyperlipidemia, unspecified: Secondary | ICD-10-CM | POA: Diagnosis not present

## 2019-05-29 DIAGNOSIS — I959 Hypotension, unspecified: Secondary | ICD-10-CM | POA: Diagnosis not present

## 2019-05-29 DIAGNOSIS — J690 Pneumonitis due to inhalation of food and vomit: Secondary | ICD-10-CM | POA: Diagnosis not present

## 2019-05-29 DIAGNOSIS — M0579 Rheumatoid arthritis with rheumatoid factor of multiple sites without organ or systems involvement: Secondary | ICD-10-CM | POA: Diagnosis not present

## 2019-05-29 DIAGNOSIS — R1312 Dysphagia, oropharyngeal phase: Secondary | ICD-10-CM | POA: Diagnosis not present

## 2019-05-29 DIAGNOSIS — Z87440 Personal history of urinary (tract) infections: Secondary | ICD-10-CM | POA: Diagnosis not present

## 2019-05-29 DIAGNOSIS — Z9181 History of falling: Secondary | ICD-10-CM | POA: Diagnosis not present

## 2019-05-29 DIAGNOSIS — J440 Chronic obstructive pulmonary disease with acute lower respiratory infection: Secondary | ICD-10-CM | POA: Diagnosis not present

## 2019-05-29 DIAGNOSIS — E119 Type 2 diabetes mellitus without complications: Secondary | ICD-10-CM | POA: Diagnosis not present

## 2019-05-29 DIAGNOSIS — M159 Polyosteoarthritis, unspecified: Secondary | ICD-10-CM | POA: Diagnosis not present

## 2019-05-29 DIAGNOSIS — I5032 Chronic diastolic (congestive) heart failure: Secondary | ICD-10-CM | POA: Diagnosis not present

## 2019-05-29 DIAGNOSIS — J9621 Acute and chronic respiratory failure with hypoxia: Secondary | ICD-10-CM | POA: Diagnosis not present

## 2019-05-29 DIAGNOSIS — J441 Chronic obstructive pulmonary disease with (acute) exacerbation: Secondary | ICD-10-CM | POA: Diagnosis not present

## 2019-05-29 DIAGNOSIS — N179 Acute kidney failure, unspecified: Secondary | ICD-10-CM | POA: Diagnosis not present

## 2019-05-29 DIAGNOSIS — E876 Hypokalemia: Secondary | ICD-10-CM | POA: Diagnosis not present

## 2019-05-29 DIAGNOSIS — E039 Hypothyroidism, unspecified: Secondary | ICD-10-CM | POA: Diagnosis not present

## 2019-05-29 DIAGNOSIS — I110 Hypertensive heart disease with heart failure: Secondary | ICD-10-CM | POA: Diagnosis not present

## 2019-05-29 DIAGNOSIS — G9341 Metabolic encephalopathy: Secondary | ICD-10-CM | POA: Diagnosis not present

## 2019-05-29 DIAGNOSIS — D509 Iron deficiency anemia, unspecified: Secondary | ICD-10-CM | POA: Diagnosis not present

## 2019-05-29 DIAGNOSIS — Z87891 Personal history of nicotine dependence: Secondary | ICD-10-CM | POA: Diagnosis not present

## 2019-05-29 DIAGNOSIS — Z7984 Long term (current) use of oral hypoglycemic drugs: Secondary | ICD-10-CM | POA: Diagnosis not present

## 2019-05-30 DIAGNOSIS — Z9181 History of falling: Secondary | ICD-10-CM | POA: Diagnosis not present

## 2019-05-30 DIAGNOSIS — Z7984 Long term (current) use of oral hypoglycemic drugs: Secondary | ICD-10-CM | POA: Diagnosis not present

## 2019-05-30 DIAGNOSIS — Z87891 Personal history of nicotine dependence: Secondary | ICD-10-CM | POA: Diagnosis not present

## 2019-05-30 DIAGNOSIS — J690 Pneumonitis due to inhalation of food and vomit: Secondary | ICD-10-CM | POA: Diagnosis not present

## 2019-05-30 DIAGNOSIS — J441 Chronic obstructive pulmonary disease with (acute) exacerbation: Secondary | ICD-10-CM | POA: Diagnosis not present

## 2019-05-30 DIAGNOSIS — I959 Hypotension, unspecified: Secondary | ICD-10-CM | POA: Diagnosis not present

## 2019-05-30 DIAGNOSIS — I110 Hypertensive heart disease with heart failure: Secondary | ICD-10-CM | POA: Diagnosis not present

## 2019-05-30 DIAGNOSIS — D509 Iron deficiency anemia, unspecified: Secondary | ICD-10-CM | POA: Diagnosis not present

## 2019-05-30 DIAGNOSIS — M0579 Rheumatoid arthritis with rheumatoid factor of multiple sites without organ or systems involvement: Secondary | ICD-10-CM | POA: Diagnosis not present

## 2019-05-30 DIAGNOSIS — J440 Chronic obstructive pulmonary disease with acute lower respiratory infection: Secondary | ICD-10-CM | POA: Diagnosis not present

## 2019-05-30 DIAGNOSIS — G9341 Metabolic encephalopathy: Secondary | ICD-10-CM | POA: Diagnosis not present

## 2019-05-30 DIAGNOSIS — E119 Type 2 diabetes mellitus without complications: Secondary | ICD-10-CM | POA: Diagnosis not present

## 2019-05-30 DIAGNOSIS — M159 Polyosteoarthritis, unspecified: Secondary | ICD-10-CM | POA: Diagnosis not present

## 2019-05-30 DIAGNOSIS — R1312 Dysphagia, oropharyngeal phase: Secondary | ICD-10-CM | POA: Diagnosis not present

## 2019-05-30 DIAGNOSIS — E039 Hypothyroidism, unspecified: Secondary | ICD-10-CM | POA: Diagnosis not present

## 2019-05-30 DIAGNOSIS — I5032 Chronic diastolic (congestive) heart failure: Secondary | ICD-10-CM | POA: Diagnosis not present

## 2019-05-30 DIAGNOSIS — E876 Hypokalemia: Secondary | ICD-10-CM | POA: Diagnosis not present

## 2019-05-30 DIAGNOSIS — N179 Acute kidney failure, unspecified: Secondary | ICD-10-CM | POA: Diagnosis not present

## 2019-05-30 DIAGNOSIS — Z87440 Personal history of urinary (tract) infections: Secondary | ICD-10-CM | POA: Diagnosis not present

## 2019-05-30 DIAGNOSIS — E785 Hyperlipidemia, unspecified: Secondary | ICD-10-CM | POA: Diagnosis not present

## 2019-05-30 DIAGNOSIS — J9621 Acute and chronic respiratory failure with hypoxia: Secondary | ICD-10-CM | POA: Diagnosis not present

## 2019-05-31 DIAGNOSIS — E876 Hypokalemia: Secondary | ICD-10-CM | POA: Diagnosis not present

## 2019-05-31 DIAGNOSIS — E119 Type 2 diabetes mellitus without complications: Secondary | ICD-10-CM | POA: Diagnosis not present

## 2019-05-31 DIAGNOSIS — Z87440 Personal history of urinary (tract) infections: Secondary | ICD-10-CM | POA: Diagnosis not present

## 2019-05-31 DIAGNOSIS — J440 Chronic obstructive pulmonary disease with acute lower respiratory infection: Secondary | ICD-10-CM | POA: Diagnosis not present

## 2019-05-31 DIAGNOSIS — N179 Acute kidney failure, unspecified: Secondary | ICD-10-CM | POA: Diagnosis not present

## 2019-05-31 DIAGNOSIS — M0579 Rheumatoid arthritis with rheumatoid factor of multiple sites without organ or systems involvement: Secondary | ICD-10-CM | POA: Diagnosis not present

## 2019-05-31 DIAGNOSIS — J441 Chronic obstructive pulmonary disease with (acute) exacerbation: Secondary | ICD-10-CM | POA: Diagnosis not present

## 2019-05-31 DIAGNOSIS — J9621 Acute and chronic respiratory failure with hypoxia: Secondary | ICD-10-CM | POA: Diagnosis not present

## 2019-05-31 DIAGNOSIS — G9341 Metabolic encephalopathy: Secondary | ICD-10-CM | POA: Diagnosis not present

## 2019-05-31 DIAGNOSIS — M159 Polyosteoarthritis, unspecified: Secondary | ICD-10-CM | POA: Diagnosis not present

## 2019-05-31 DIAGNOSIS — E039 Hypothyroidism, unspecified: Secondary | ICD-10-CM | POA: Diagnosis not present

## 2019-05-31 DIAGNOSIS — Z9181 History of falling: Secondary | ICD-10-CM | POA: Diagnosis not present

## 2019-05-31 DIAGNOSIS — D509 Iron deficiency anemia, unspecified: Secondary | ICD-10-CM | POA: Diagnosis not present

## 2019-05-31 DIAGNOSIS — J690 Pneumonitis due to inhalation of food and vomit: Secondary | ICD-10-CM | POA: Diagnosis not present

## 2019-05-31 DIAGNOSIS — Z7984 Long term (current) use of oral hypoglycemic drugs: Secondary | ICD-10-CM | POA: Diagnosis not present

## 2019-05-31 DIAGNOSIS — I110 Hypertensive heart disease with heart failure: Secondary | ICD-10-CM | POA: Diagnosis not present

## 2019-05-31 DIAGNOSIS — I5032 Chronic diastolic (congestive) heart failure: Secondary | ICD-10-CM | POA: Diagnosis not present

## 2019-05-31 DIAGNOSIS — Z87891 Personal history of nicotine dependence: Secondary | ICD-10-CM | POA: Diagnosis not present

## 2019-05-31 DIAGNOSIS — I959 Hypotension, unspecified: Secondary | ICD-10-CM | POA: Diagnosis not present

## 2019-05-31 DIAGNOSIS — R1312 Dysphagia, oropharyngeal phase: Secondary | ICD-10-CM | POA: Diagnosis not present

## 2019-05-31 DIAGNOSIS — E785 Hyperlipidemia, unspecified: Secondary | ICD-10-CM | POA: Diagnosis not present

## 2019-06-01 DIAGNOSIS — E119 Type 2 diabetes mellitus without complications: Secondary | ICD-10-CM | POA: Diagnosis not present

## 2019-06-01 DIAGNOSIS — M0579 Rheumatoid arthritis with rheumatoid factor of multiple sites without organ or systems involvement: Secondary | ICD-10-CM | POA: Diagnosis not present

## 2019-06-01 DIAGNOSIS — I110 Hypertensive heart disease with heart failure: Secondary | ICD-10-CM | POA: Diagnosis not present

## 2019-06-01 DIAGNOSIS — M159 Polyosteoarthritis, unspecified: Secondary | ICD-10-CM | POA: Diagnosis not present

## 2019-06-01 DIAGNOSIS — I5032 Chronic diastolic (congestive) heart failure: Secondary | ICD-10-CM | POA: Diagnosis not present

## 2019-06-01 DIAGNOSIS — E785 Hyperlipidemia, unspecified: Secondary | ICD-10-CM | POA: Diagnosis not present

## 2019-06-01 DIAGNOSIS — Z9181 History of falling: Secondary | ICD-10-CM | POA: Diagnosis not present

## 2019-06-01 DIAGNOSIS — J9621 Acute and chronic respiratory failure with hypoxia: Secondary | ICD-10-CM | POA: Diagnosis not present

## 2019-06-01 DIAGNOSIS — D509 Iron deficiency anemia, unspecified: Secondary | ICD-10-CM | POA: Diagnosis not present

## 2019-06-01 DIAGNOSIS — G9341 Metabolic encephalopathy: Secondary | ICD-10-CM | POA: Diagnosis not present

## 2019-06-01 DIAGNOSIS — I959 Hypotension, unspecified: Secondary | ICD-10-CM | POA: Diagnosis not present

## 2019-06-01 DIAGNOSIS — J441 Chronic obstructive pulmonary disease with (acute) exacerbation: Secondary | ICD-10-CM | POA: Diagnosis not present

## 2019-06-01 DIAGNOSIS — N179 Acute kidney failure, unspecified: Secondary | ICD-10-CM | POA: Diagnosis not present

## 2019-06-01 DIAGNOSIS — E876 Hypokalemia: Secondary | ICD-10-CM | POA: Diagnosis not present

## 2019-06-01 DIAGNOSIS — J690 Pneumonitis due to inhalation of food and vomit: Secondary | ICD-10-CM | POA: Diagnosis not present

## 2019-06-01 DIAGNOSIS — Z7984 Long term (current) use of oral hypoglycemic drugs: Secondary | ICD-10-CM | POA: Diagnosis not present

## 2019-06-01 DIAGNOSIS — E039 Hypothyroidism, unspecified: Secondary | ICD-10-CM | POA: Diagnosis not present

## 2019-06-01 DIAGNOSIS — Z87440 Personal history of urinary (tract) infections: Secondary | ICD-10-CM | POA: Diagnosis not present

## 2019-06-01 DIAGNOSIS — J440 Chronic obstructive pulmonary disease with acute lower respiratory infection: Secondary | ICD-10-CM | POA: Diagnosis not present

## 2019-06-01 DIAGNOSIS — Z87891 Personal history of nicotine dependence: Secondary | ICD-10-CM | POA: Diagnosis not present

## 2019-06-01 DIAGNOSIS — R1312 Dysphagia, oropharyngeal phase: Secondary | ICD-10-CM | POA: Diagnosis not present

## 2019-06-04 ENCOUNTER — Other Ambulatory Visit: Payer: Self-pay

## 2019-06-04 NOTE — Patient Outreach (Signed)
[facility]) Care Management  06/04/2019  Karla Clark [birth year] [id number]  Medication Adherence call to Mrs. Karla Clark Verify spoke with patients daughter.patient is showing past due on Metformin er 500 mg,daughter explain she has a prescription ready at the pharmacy and will pick up today,patient will ask [facility] to fill the prescription for a 90 days supply. Karla Clark is showing past due under [facility] Ins.   Lillia Abed CPhT Pharmacy Technician [facility] Management Direct Dial [phone]  Fax [phone] [email or web address]

## 2019-06-05 DIAGNOSIS — E039 Hypothyroidism, unspecified: Secondary | ICD-10-CM | POA: Diagnosis not present

## 2019-06-05 DIAGNOSIS — J441 Chronic obstructive pulmonary disease with (acute) exacerbation: Secondary | ICD-10-CM | POA: Diagnosis not present

## 2019-06-05 DIAGNOSIS — E876 Hypokalemia: Secondary | ICD-10-CM | POA: Diagnosis not present

## 2019-06-05 DIAGNOSIS — J690 Pneumonitis due to inhalation of food and vomit: Secondary | ICD-10-CM | POA: Diagnosis not present

## 2019-06-05 DIAGNOSIS — M0579 Rheumatoid arthritis with rheumatoid factor of multiple sites without organ or systems involvement: Secondary | ICD-10-CM | POA: Diagnosis not present

## 2019-06-05 DIAGNOSIS — E119 Type 2 diabetes mellitus without complications: Secondary | ICD-10-CM | POA: Diagnosis not present

## 2019-06-05 DIAGNOSIS — I5032 Chronic diastolic (congestive) heart failure: Secondary | ICD-10-CM | POA: Diagnosis not present

## 2019-06-05 DIAGNOSIS — I959 Hypotension, unspecified: Secondary | ICD-10-CM | POA: Diagnosis not present

## 2019-06-05 DIAGNOSIS — Z7984 Long term (current) use of oral hypoglycemic drugs: Secondary | ICD-10-CM | POA: Diagnosis not present

## 2019-06-05 DIAGNOSIS — I110 Hypertensive heart disease with heart failure: Secondary | ICD-10-CM | POA: Diagnosis not present

## 2019-06-05 DIAGNOSIS — R1312 Dysphagia, oropharyngeal phase: Secondary | ICD-10-CM | POA: Diagnosis not present

## 2019-06-05 DIAGNOSIS — G9341 Metabolic encephalopathy: Secondary | ICD-10-CM | POA: Diagnosis not present

## 2019-06-05 DIAGNOSIS — J440 Chronic obstructive pulmonary disease with acute lower respiratory infection: Secondary | ICD-10-CM | POA: Diagnosis not present

## 2019-06-05 DIAGNOSIS — E785 Hyperlipidemia, unspecified: Secondary | ICD-10-CM | POA: Diagnosis not present

## 2019-06-05 DIAGNOSIS — J9621 Acute and chronic respiratory failure with hypoxia: Secondary | ICD-10-CM | POA: Diagnosis not present

## 2019-06-05 DIAGNOSIS — M159 Polyosteoarthritis, unspecified: Secondary | ICD-10-CM | POA: Diagnosis not present

## 2019-06-05 DIAGNOSIS — N179 Acute kidney failure, unspecified: Secondary | ICD-10-CM | POA: Diagnosis not present

## 2019-06-05 DIAGNOSIS — D509 Iron deficiency anemia, unspecified: Secondary | ICD-10-CM | POA: Diagnosis not present

## 2019-06-05 DIAGNOSIS — Z87440 Personal history of urinary (tract) infections: Secondary | ICD-10-CM | POA: Diagnosis not present

## 2019-06-05 DIAGNOSIS — Z9181 History of falling: Secondary | ICD-10-CM | POA: Diagnosis not present

## 2019-06-05 DIAGNOSIS — Z87891 Personal history of nicotine dependence: Secondary | ICD-10-CM | POA: Diagnosis not present

## 2019-06-08 DIAGNOSIS — R1312 Dysphagia, oropharyngeal phase: Secondary | ICD-10-CM | POA: Diagnosis not present

## 2019-06-08 DIAGNOSIS — Z87891 Personal history of nicotine dependence: Secondary | ICD-10-CM | POA: Diagnosis not present

## 2019-06-08 DIAGNOSIS — Z9181 History of falling: Secondary | ICD-10-CM | POA: Diagnosis not present

## 2019-06-08 DIAGNOSIS — J440 Chronic obstructive pulmonary disease with acute lower respiratory infection: Secondary | ICD-10-CM | POA: Diagnosis not present

## 2019-06-08 DIAGNOSIS — E039 Hypothyroidism, unspecified: Secondary | ICD-10-CM | POA: Diagnosis not present

## 2019-06-08 DIAGNOSIS — E119 Type 2 diabetes mellitus without complications: Secondary | ICD-10-CM | POA: Diagnosis not present

## 2019-06-08 DIAGNOSIS — J9621 Acute and chronic respiratory failure with hypoxia: Secondary | ICD-10-CM | POA: Diagnosis not present

## 2019-06-08 DIAGNOSIS — E785 Hyperlipidemia, unspecified: Secondary | ICD-10-CM | POA: Diagnosis not present

## 2019-06-08 DIAGNOSIS — Z7984 Long term (current) use of oral hypoglycemic drugs: Secondary | ICD-10-CM | POA: Diagnosis not present

## 2019-06-08 DIAGNOSIS — J690 Pneumonitis due to inhalation of food and vomit: Secondary | ICD-10-CM | POA: Diagnosis not present

## 2019-06-08 DIAGNOSIS — I5032 Chronic diastolic (congestive) heart failure: Secondary | ICD-10-CM | POA: Diagnosis not present

## 2019-06-08 DIAGNOSIS — G9341 Metabolic encephalopathy: Secondary | ICD-10-CM | POA: Diagnosis not present

## 2019-06-08 DIAGNOSIS — I959 Hypotension, unspecified: Secondary | ICD-10-CM | POA: Diagnosis not present

## 2019-06-08 DIAGNOSIS — M159 Polyosteoarthritis, unspecified: Secondary | ICD-10-CM | POA: Diagnosis not present

## 2019-06-08 DIAGNOSIS — J441 Chronic obstructive pulmonary disease with (acute) exacerbation: Secondary | ICD-10-CM | POA: Diagnosis not present

## 2019-06-08 DIAGNOSIS — E876 Hypokalemia: Secondary | ICD-10-CM | POA: Diagnosis not present

## 2019-06-08 DIAGNOSIS — Z87440 Personal history of urinary (tract) infections: Secondary | ICD-10-CM | POA: Diagnosis not present

## 2019-06-08 DIAGNOSIS — M0579 Rheumatoid arthritis with rheumatoid factor of multiple sites without organ or systems involvement: Secondary | ICD-10-CM | POA: Diagnosis not present

## 2019-06-08 DIAGNOSIS — I110 Hypertensive heart disease with heart failure: Secondary | ICD-10-CM | POA: Diagnosis not present

## 2019-06-08 DIAGNOSIS — D509 Iron deficiency anemia, unspecified: Secondary | ICD-10-CM | POA: Diagnosis not present

## 2019-06-08 DIAGNOSIS — N179 Acute kidney failure, unspecified: Secondary | ICD-10-CM | POA: Diagnosis not present

## 2019-06-11 DIAGNOSIS — J441 Chronic obstructive pulmonary disease with (acute) exacerbation: Secondary | ICD-10-CM | POA: Diagnosis not present

## 2019-06-11 DIAGNOSIS — D509 Iron deficiency anemia, unspecified: Secondary | ICD-10-CM | POA: Diagnosis not present

## 2019-06-11 DIAGNOSIS — I110 Hypertensive heart disease with heart failure: Secondary | ICD-10-CM | POA: Diagnosis not present

## 2019-06-11 DIAGNOSIS — R1312 Dysphagia, oropharyngeal phase: Secondary | ICD-10-CM | POA: Diagnosis not present

## 2019-06-11 DIAGNOSIS — J440 Chronic obstructive pulmonary disease with acute lower respiratory infection: Secondary | ICD-10-CM | POA: Diagnosis not present

## 2019-06-11 DIAGNOSIS — M0579 Rheumatoid arthritis with rheumatoid factor of multiple sites without organ or systems involvement: Secondary | ICD-10-CM | POA: Diagnosis not present

## 2019-06-11 DIAGNOSIS — J690 Pneumonitis due to inhalation of food and vomit: Secondary | ICD-10-CM | POA: Diagnosis not present

## 2019-06-11 DIAGNOSIS — I959 Hypotension, unspecified: Secondary | ICD-10-CM | POA: Diagnosis not present

## 2019-06-11 DIAGNOSIS — Z7984 Long term (current) use of oral hypoglycemic drugs: Secondary | ICD-10-CM | POA: Diagnosis not present

## 2019-06-11 DIAGNOSIS — Z9181 History of falling: Secondary | ICD-10-CM | POA: Diagnosis not present

## 2019-06-11 DIAGNOSIS — Z87891 Personal history of nicotine dependence: Secondary | ICD-10-CM | POA: Diagnosis not present

## 2019-06-11 DIAGNOSIS — M159 Polyosteoarthritis, unspecified: Secondary | ICD-10-CM | POA: Diagnosis not present

## 2019-06-11 DIAGNOSIS — E119 Type 2 diabetes mellitus without complications: Secondary | ICD-10-CM | POA: Diagnosis not present

## 2019-06-11 DIAGNOSIS — J9621 Acute and chronic respiratory failure with hypoxia: Secondary | ICD-10-CM | POA: Diagnosis not present

## 2019-06-11 DIAGNOSIS — Z87440 Personal history of urinary (tract) infections: Secondary | ICD-10-CM | POA: Diagnosis not present

## 2019-06-11 DIAGNOSIS — E876 Hypokalemia: Secondary | ICD-10-CM | POA: Diagnosis not present

## 2019-06-11 DIAGNOSIS — I5032 Chronic diastolic (congestive) heart failure: Secondary | ICD-10-CM | POA: Diagnosis not present

## 2019-06-11 DIAGNOSIS — N179 Acute kidney failure, unspecified: Secondary | ICD-10-CM | POA: Diagnosis not present

## 2019-06-11 DIAGNOSIS — E785 Hyperlipidemia, unspecified: Secondary | ICD-10-CM | POA: Diagnosis not present

## 2019-06-11 DIAGNOSIS — G9341 Metabolic encephalopathy: Secondary | ICD-10-CM | POA: Diagnosis not present

## 2019-06-11 DIAGNOSIS — E039 Hypothyroidism, unspecified: Secondary | ICD-10-CM | POA: Diagnosis not present

## 2019-06-12 DIAGNOSIS — J449 Chronic obstructive pulmonary disease, unspecified: Secondary | ICD-10-CM | POA: Diagnosis not present

## 2019-06-13 DIAGNOSIS — Z9181 History of falling: Secondary | ICD-10-CM | POA: Diagnosis not present

## 2019-06-13 DIAGNOSIS — Z139 Encounter for screening, unspecified: Secondary | ICD-10-CM | POA: Diagnosis not present

## 2019-06-13 DIAGNOSIS — R1312 Dysphagia, oropharyngeal phase: Secondary | ICD-10-CM | POA: Diagnosis not present

## 2019-06-13 DIAGNOSIS — E039 Hypothyroidism, unspecified: Secondary | ICD-10-CM | POA: Diagnosis not present

## 2019-06-13 DIAGNOSIS — I5032 Chronic diastolic (congestive) heart failure: Secondary | ICD-10-CM | POA: Diagnosis not present

## 2019-06-13 DIAGNOSIS — Z87891 Personal history of nicotine dependence: Secondary | ICD-10-CM | POA: Diagnosis not present

## 2019-06-13 DIAGNOSIS — D509 Iron deficiency anemia, unspecified: Secondary | ICD-10-CM | POA: Diagnosis not present

## 2019-06-13 DIAGNOSIS — J440 Chronic obstructive pulmonary disease with acute lower respiratory infection: Secondary | ICD-10-CM | POA: Diagnosis not present

## 2019-06-13 DIAGNOSIS — Z7189 Other specified counseling: Secondary | ICD-10-CM | POA: Diagnosis not present

## 2019-06-13 DIAGNOSIS — J441 Chronic obstructive pulmonary disease with (acute) exacerbation: Secondary | ICD-10-CM | POA: Diagnosis not present

## 2019-06-13 DIAGNOSIS — Z87440 Personal history of urinary (tract) infections: Secondary | ICD-10-CM | POA: Diagnosis not present

## 2019-06-13 DIAGNOSIS — N179 Acute kidney failure, unspecified: Secondary | ICD-10-CM | POA: Diagnosis not present

## 2019-06-13 DIAGNOSIS — J9621 Acute and chronic respiratory failure with hypoxia: Secondary | ICD-10-CM | POA: Diagnosis not present

## 2019-06-13 DIAGNOSIS — M159 Polyosteoarthritis, unspecified: Secondary | ICD-10-CM | POA: Diagnosis not present

## 2019-06-13 DIAGNOSIS — M0579 Rheumatoid arthritis with rheumatoid factor of multiple sites without organ or systems involvement: Secondary | ICD-10-CM | POA: Diagnosis not present

## 2019-06-13 DIAGNOSIS — E119 Type 2 diabetes mellitus without complications: Secondary | ICD-10-CM | POA: Diagnosis not present

## 2019-06-13 DIAGNOSIS — J690 Pneumonitis due to inhalation of food and vomit: Secondary | ICD-10-CM | POA: Diagnosis not present

## 2019-06-13 DIAGNOSIS — Z7984 Long term (current) use of oral hypoglycemic drugs: Secondary | ICD-10-CM | POA: Diagnosis not present

## 2019-06-13 DIAGNOSIS — I110 Hypertensive heart disease with heart failure: Secondary | ICD-10-CM | POA: Diagnosis not present

## 2019-06-13 DIAGNOSIS — E785 Hyperlipidemia, unspecified: Secondary | ICD-10-CM | POA: Diagnosis not present

## 2019-06-13 DIAGNOSIS — E876 Hypokalemia: Secondary | ICD-10-CM | POA: Diagnosis not present

## 2019-06-13 DIAGNOSIS — I509 Heart failure, unspecified: Secondary | ICD-10-CM | POA: Diagnosis not present

## 2019-06-13 DIAGNOSIS — Z0000 Encounter for general adult medical examination without abnormal findings: Secondary | ICD-10-CM | POA: Diagnosis not present

## 2019-06-13 DIAGNOSIS — Z136 Encounter for screening for cardiovascular disorders: Secondary | ICD-10-CM | POA: Diagnosis not present

## 2019-06-13 DIAGNOSIS — I959 Hypotension, unspecified: Secondary | ICD-10-CM | POA: Diagnosis not present

## 2019-06-13 DIAGNOSIS — G9341 Metabolic encephalopathy: Secondary | ICD-10-CM | POA: Diagnosis not present

## 2019-06-14 DIAGNOSIS — Z7984 Long term (current) use of oral hypoglycemic drugs: Secondary | ICD-10-CM | POA: Diagnosis not present

## 2019-06-14 DIAGNOSIS — E039 Hypothyroidism, unspecified: Secondary | ICD-10-CM | POA: Diagnosis not present

## 2019-06-14 DIAGNOSIS — Z9181 History of falling: Secondary | ICD-10-CM | POA: Diagnosis not present

## 2019-06-14 DIAGNOSIS — J440 Chronic obstructive pulmonary disease with acute lower respiratory infection: Secondary | ICD-10-CM | POA: Diagnosis not present

## 2019-06-14 DIAGNOSIS — G9341 Metabolic encephalopathy: Secondary | ICD-10-CM | POA: Diagnosis not present

## 2019-06-14 DIAGNOSIS — I5032 Chronic diastolic (congestive) heart failure: Secondary | ICD-10-CM | POA: Diagnosis not present

## 2019-06-14 DIAGNOSIS — N179 Acute kidney failure, unspecified: Secondary | ICD-10-CM | POA: Diagnosis not present

## 2019-06-14 DIAGNOSIS — Z87440 Personal history of urinary (tract) infections: Secondary | ICD-10-CM | POA: Diagnosis not present

## 2019-06-14 DIAGNOSIS — I110 Hypertensive heart disease with heart failure: Secondary | ICD-10-CM | POA: Diagnosis not present

## 2019-06-14 DIAGNOSIS — J441 Chronic obstructive pulmonary disease with (acute) exacerbation: Secondary | ICD-10-CM | POA: Diagnosis not present

## 2019-06-14 DIAGNOSIS — R1312 Dysphagia, oropharyngeal phase: Secondary | ICD-10-CM | POA: Diagnosis not present

## 2019-06-14 DIAGNOSIS — M159 Polyosteoarthritis, unspecified: Secondary | ICD-10-CM | POA: Diagnosis not present

## 2019-06-14 DIAGNOSIS — Z87891 Personal history of nicotine dependence: Secondary | ICD-10-CM | POA: Diagnosis not present

## 2019-06-14 DIAGNOSIS — I959 Hypotension, unspecified: Secondary | ICD-10-CM | POA: Diagnosis not present

## 2019-06-14 DIAGNOSIS — D509 Iron deficiency anemia, unspecified: Secondary | ICD-10-CM | POA: Diagnosis not present

## 2019-06-14 DIAGNOSIS — J690 Pneumonitis due to inhalation of food and vomit: Secondary | ICD-10-CM | POA: Diagnosis not present

## 2019-06-14 DIAGNOSIS — E119 Type 2 diabetes mellitus without complications: Secondary | ICD-10-CM | POA: Diagnosis not present

## 2019-06-14 DIAGNOSIS — E785 Hyperlipidemia, unspecified: Secondary | ICD-10-CM | POA: Diagnosis not present

## 2019-06-14 DIAGNOSIS — E876 Hypokalemia: Secondary | ICD-10-CM | POA: Diagnosis not present

## 2019-06-14 DIAGNOSIS — M0579 Rheumatoid arthritis with rheumatoid factor of multiple sites without organ or systems involvement: Secondary | ICD-10-CM | POA: Diagnosis not present

## 2019-06-14 DIAGNOSIS — J9621 Acute and chronic respiratory failure with hypoxia: Secondary | ICD-10-CM | POA: Diagnosis not present

## 2019-06-15 DIAGNOSIS — Z87891 Personal history of nicotine dependence: Secondary | ICD-10-CM | POA: Diagnosis not present

## 2019-06-15 DIAGNOSIS — J9621 Acute and chronic respiratory failure with hypoxia: Secondary | ICD-10-CM | POA: Diagnosis not present

## 2019-06-15 DIAGNOSIS — Z9181 History of falling: Secondary | ICD-10-CM | POA: Diagnosis not present

## 2019-06-15 DIAGNOSIS — J441 Chronic obstructive pulmonary disease with (acute) exacerbation: Secondary | ICD-10-CM | POA: Diagnosis not present

## 2019-06-15 DIAGNOSIS — M0579 Rheumatoid arthritis with rheumatoid factor of multiple sites without organ or systems involvement: Secondary | ICD-10-CM | POA: Diagnosis not present

## 2019-06-15 DIAGNOSIS — I110 Hypertensive heart disease with heart failure: Secondary | ICD-10-CM | POA: Diagnosis not present

## 2019-06-15 DIAGNOSIS — E039 Hypothyroidism, unspecified: Secondary | ICD-10-CM | POA: Diagnosis not present

## 2019-06-15 DIAGNOSIS — E785 Hyperlipidemia, unspecified: Secondary | ICD-10-CM | POA: Diagnosis not present

## 2019-06-15 DIAGNOSIS — I5032 Chronic diastolic (congestive) heart failure: Secondary | ICD-10-CM | POA: Diagnosis not present

## 2019-06-15 DIAGNOSIS — M159 Polyosteoarthritis, unspecified: Secondary | ICD-10-CM | POA: Diagnosis not present

## 2019-06-15 DIAGNOSIS — G9341 Metabolic encephalopathy: Secondary | ICD-10-CM | POA: Diagnosis not present

## 2019-06-15 DIAGNOSIS — N179 Acute kidney failure, unspecified: Secondary | ICD-10-CM | POA: Diagnosis not present

## 2019-06-15 DIAGNOSIS — J440 Chronic obstructive pulmonary disease with acute lower respiratory infection: Secondary | ICD-10-CM | POA: Diagnosis not present

## 2019-06-15 DIAGNOSIS — Z7984 Long term (current) use of oral hypoglycemic drugs: Secondary | ICD-10-CM | POA: Diagnosis not present

## 2019-06-15 DIAGNOSIS — E876 Hypokalemia: Secondary | ICD-10-CM | POA: Diagnosis not present

## 2019-06-15 DIAGNOSIS — Z87440 Personal history of urinary (tract) infections: Secondary | ICD-10-CM | POA: Diagnosis not present

## 2019-06-15 DIAGNOSIS — J690 Pneumonitis due to inhalation of food and vomit: Secondary | ICD-10-CM | POA: Diagnosis not present

## 2019-06-15 DIAGNOSIS — I959 Hypotension, unspecified: Secondary | ICD-10-CM | POA: Diagnosis not present

## 2019-06-15 DIAGNOSIS — D509 Iron deficiency anemia, unspecified: Secondary | ICD-10-CM | POA: Diagnosis not present

## 2019-06-15 DIAGNOSIS — R1312 Dysphagia, oropharyngeal phase: Secondary | ICD-10-CM | POA: Diagnosis not present

## 2019-06-15 DIAGNOSIS — E119 Type 2 diabetes mellitus without complications: Secondary | ICD-10-CM | POA: Diagnosis not present

## 2019-06-19 DIAGNOSIS — Z7984 Long term (current) use of oral hypoglycemic drugs: Secondary | ICD-10-CM | POA: Diagnosis not present

## 2019-06-19 DIAGNOSIS — Z9181 History of falling: Secondary | ICD-10-CM | POA: Diagnosis not present

## 2019-06-19 DIAGNOSIS — J690 Pneumonitis due to inhalation of food and vomit: Secondary | ICD-10-CM | POA: Diagnosis not present

## 2019-06-19 DIAGNOSIS — Z87440 Personal history of urinary (tract) infections: Secondary | ICD-10-CM | POA: Diagnosis not present

## 2019-06-19 DIAGNOSIS — I110 Hypertensive heart disease with heart failure: Secondary | ICD-10-CM | POA: Diagnosis not present

## 2019-06-19 DIAGNOSIS — E876 Hypokalemia: Secondary | ICD-10-CM | POA: Diagnosis not present

## 2019-06-19 DIAGNOSIS — I5032 Chronic diastolic (congestive) heart failure: Secondary | ICD-10-CM | POA: Diagnosis not present

## 2019-06-19 DIAGNOSIS — J9621 Acute and chronic respiratory failure with hypoxia: Secondary | ICD-10-CM | POA: Diagnosis not present

## 2019-06-19 DIAGNOSIS — G9341 Metabolic encephalopathy: Secondary | ICD-10-CM | POA: Diagnosis not present

## 2019-06-19 DIAGNOSIS — N179 Acute kidney failure, unspecified: Secondary | ICD-10-CM | POA: Diagnosis not present

## 2019-06-19 DIAGNOSIS — E119 Type 2 diabetes mellitus without complications: Secondary | ICD-10-CM | POA: Diagnosis not present

## 2019-06-19 DIAGNOSIS — D509 Iron deficiency anemia, unspecified: Secondary | ICD-10-CM | POA: Diagnosis not present

## 2019-06-19 DIAGNOSIS — Z87891 Personal history of nicotine dependence: Secondary | ICD-10-CM | POA: Diagnosis not present

## 2019-06-19 DIAGNOSIS — M0579 Rheumatoid arthritis with rheumatoid factor of multiple sites without organ or systems involvement: Secondary | ICD-10-CM | POA: Diagnosis not present

## 2019-06-19 DIAGNOSIS — M159 Polyosteoarthritis, unspecified: Secondary | ICD-10-CM | POA: Diagnosis not present

## 2019-06-19 DIAGNOSIS — I959 Hypotension, unspecified: Secondary | ICD-10-CM | POA: Diagnosis not present

## 2019-06-19 DIAGNOSIS — E785 Hyperlipidemia, unspecified: Secondary | ICD-10-CM | POA: Diagnosis not present

## 2019-06-19 DIAGNOSIS — J441 Chronic obstructive pulmonary disease with (acute) exacerbation: Secondary | ICD-10-CM | POA: Diagnosis not present

## 2019-06-19 DIAGNOSIS — R1312 Dysphagia, oropharyngeal phase: Secondary | ICD-10-CM | POA: Diagnosis not present

## 2019-06-19 DIAGNOSIS — J440 Chronic obstructive pulmonary disease with acute lower respiratory infection: Secondary | ICD-10-CM | POA: Diagnosis not present

## 2019-06-19 DIAGNOSIS — E039 Hypothyroidism, unspecified: Secondary | ICD-10-CM | POA: Diagnosis not present

## 2019-06-20 DIAGNOSIS — J9621 Acute and chronic respiratory failure with hypoxia: Secondary | ICD-10-CM | POA: Diagnosis not present

## 2019-06-20 DIAGNOSIS — R1312 Dysphagia, oropharyngeal phase: Secondary | ICD-10-CM | POA: Diagnosis not present

## 2019-06-20 DIAGNOSIS — E876 Hypokalemia: Secondary | ICD-10-CM | POA: Diagnosis not present

## 2019-06-20 DIAGNOSIS — N179 Acute kidney failure, unspecified: Secondary | ICD-10-CM | POA: Diagnosis not present

## 2019-06-20 DIAGNOSIS — Z87440 Personal history of urinary (tract) infections: Secondary | ICD-10-CM | POA: Diagnosis not present

## 2019-06-20 DIAGNOSIS — M0579 Rheumatoid arthritis with rheumatoid factor of multiple sites without organ or systems involvement: Secondary | ICD-10-CM | POA: Diagnosis not present

## 2019-06-20 DIAGNOSIS — Z7984 Long term (current) use of oral hypoglycemic drugs: Secondary | ICD-10-CM | POA: Diagnosis not present

## 2019-06-20 DIAGNOSIS — E119 Type 2 diabetes mellitus without complications: Secondary | ICD-10-CM | POA: Diagnosis not present

## 2019-06-20 DIAGNOSIS — E785 Hyperlipidemia, unspecified: Secondary | ICD-10-CM | POA: Diagnosis not present

## 2019-06-20 DIAGNOSIS — I110 Hypertensive heart disease with heart failure: Secondary | ICD-10-CM | POA: Diagnosis not present

## 2019-06-20 DIAGNOSIS — J690 Pneumonitis due to inhalation of food and vomit: Secondary | ICD-10-CM | POA: Diagnosis not present

## 2019-06-20 DIAGNOSIS — J441 Chronic obstructive pulmonary disease with (acute) exacerbation: Secondary | ICD-10-CM | POA: Diagnosis not present

## 2019-06-20 DIAGNOSIS — Z9181 History of falling: Secondary | ICD-10-CM | POA: Diagnosis not present

## 2019-06-20 DIAGNOSIS — Z87891 Personal history of nicotine dependence: Secondary | ICD-10-CM | POA: Diagnosis not present

## 2019-06-20 DIAGNOSIS — I5032 Chronic diastolic (congestive) heart failure: Secondary | ICD-10-CM | POA: Diagnosis not present

## 2019-06-20 DIAGNOSIS — E039 Hypothyroidism, unspecified: Secondary | ICD-10-CM | POA: Diagnosis not present

## 2019-06-20 DIAGNOSIS — M159 Polyosteoarthritis, unspecified: Secondary | ICD-10-CM | POA: Diagnosis not present

## 2019-06-20 DIAGNOSIS — J440 Chronic obstructive pulmonary disease with acute lower respiratory infection: Secondary | ICD-10-CM | POA: Diagnosis not present

## 2019-06-20 DIAGNOSIS — D509 Iron deficiency anemia, unspecified: Secondary | ICD-10-CM | POA: Diagnosis not present

## 2019-06-20 DIAGNOSIS — I959 Hypotension, unspecified: Secondary | ICD-10-CM | POA: Diagnosis not present

## 2019-06-20 DIAGNOSIS — G9341 Metabolic encephalopathy: Secondary | ICD-10-CM | POA: Diagnosis not present

## 2019-06-22 DIAGNOSIS — E876 Hypokalemia: Secondary | ICD-10-CM | POA: Diagnosis not present

## 2019-06-22 DIAGNOSIS — E785 Hyperlipidemia, unspecified: Secondary | ICD-10-CM | POA: Diagnosis not present

## 2019-06-22 DIAGNOSIS — Z7984 Long term (current) use of oral hypoglycemic drugs: Secondary | ICD-10-CM | POA: Diagnosis not present

## 2019-06-22 DIAGNOSIS — J690 Pneumonitis due to inhalation of food and vomit: Secondary | ICD-10-CM | POA: Diagnosis not present

## 2019-06-22 DIAGNOSIS — J9621 Acute and chronic respiratory failure with hypoxia: Secondary | ICD-10-CM | POA: Diagnosis not present

## 2019-06-22 DIAGNOSIS — E119 Type 2 diabetes mellitus without complications: Secondary | ICD-10-CM | POA: Diagnosis not present

## 2019-06-22 DIAGNOSIS — I5032 Chronic diastolic (congestive) heart failure: Secondary | ICD-10-CM | POA: Diagnosis not present

## 2019-06-22 DIAGNOSIS — J441 Chronic obstructive pulmonary disease with (acute) exacerbation: Secondary | ICD-10-CM | POA: Diagnosis not present

## 2019-06-22 DIAGNOSIS — N179 Acute kidney failure, unspecified: Secondary | ICD-10-CM | POA: Diagnosis not present

## 2019-06-22 DIAGNOSIS — R1312 Dysphagia, oropharyngeal phase: Secondary | ICD-10-CM | POA: Diagnosis not present

## 2019-06-22 DIAGNOSIS — J440 Chronic obstructive pulmonary disease with acute lower respiratory infection: Secondary | ICD-10-CM | POA: Diagnosis not present

## 2019-06-22 DIAGNOSIS — E039 Hypothyroidism, unspecified: Secondary | ICD-10-CM | POA: Diagnosis not present

## 2019-06-22 DIAGNOSIS — I959 Hypotension, unspecified: Secondary | ICD-10-CM | POA: Diagnosis not present

## 2019-06-22 DIAGNOSIS — Z87440 Personal history of urinary (tract) infections: Secondary | ICD-10-CM | POA: Diagnosis not present

## 2019-06-22 DIAGNOSIS — Z87891 Personal history of nicotine dependence: Secondary | ICD-10-CM | POA: Diagnosis not present

## 2019-06-22 DIAGNOSIS — M0579 Rheumatoid arthritis with rheumatoid factor of multiple sites without organ or systems involvement: Secondary | ICD-10-CM | POA: Diagnosis not present

## 2019-06-22 DIAGNOSIS — G9341 Metabolic encephalopathy: Secondary | ICD-10-CM | POA: Diagnosis not present

## 2019-06-22 DIAGNOSIS — M159 Polyosteoarthritis, unspecified: Secondary | ICD-10-CM | POA: Diagnosis not present

## 2019-06-22 DIAGNOSIS — I110 Hypertensive heart disease with heart failure: Secondary | ICD-10-CM | POA: Diagnosis not present

## 2019-06-22 DIAGNOSIS — D509 Iron deficiency anemia, unspecified: Secondary | ICD-10-CM | POA: Diagnosis not present

## 2019-06-22 DIAGNOSIS — Z9181 History of falling: Secondary | ICD-10-CM | POA: Diagnosis not present

## 2019-06-26 DIAGNOSIS — Z87440 Personal history of urinary (tract) infections: Secondary | ICD-10-CM | POA: Diagnosis not present

## 2019-06-26 DIAGNOSIS — I110 Hypertensive heart disease with heart failure: Secondary | ICD-10-CM | POA: Diagnosis not present

## 2019-06-26 DIAGNOSIS — N179 Acute kidney failure, unspecified: Secondary | ICD-10-CM | POA: Diagnosis not present

## 2019-06-26 DIAGNOSIS — M0579 Rheumatoid arthritis with rheumatoid factor of multiple sites without organ or systems involvement: Secondary | ICD-10-CM | POA: Diagnosis not present

## 2019-06-26 DIAGNOSIS — J690 Pneumonitis due to inhalation of food and vomit: Secondary | ICD-10-CM | POA: Diagnosis not present

## 2019-06-26 DIAGNOSIS — J441 Chronic obstructive pulmonary disease with (acute) exacerbation: Secondary | ICD-10-CM | POA: Diagnosis not present

## 2019-06-26 DIAGNOSIS — I5032 Chronic diastolic (congestive) heart failure: Secondary | ICD-10-CM | POA: Diagnosis not present

## 2019-06-26 DIAGNOSIS — Z7984 Long term (current) use of oral hypoglycemic drugs: Secondary | ICD-10-CM | POA: Diagnosis not present

## 2019-06-26 DIAGNOSIS — J9621 Acute and chronic respiratory failure with hypoxia: Secondary | ICD-10-CM | POA: Diagnosis not present

## 2019-06-26 DIAGNOSIS — I959 Hypotension, unspecified: Secondary | ICD-10-CM | POA: Diagnosis not present

## 2019-06-26 DIAGNOSIS — E785 Hyperlipidemia, unspecified: Secondary | ICD-10-CM | POA: Diagnosis not present

## 2019-06-26 DIAGNOSIS — Z9181 History of falling: Secondary | ICD-10-CM | POA: Diagnosis not present

## 2019-06-26 DIAGNOSIS — E119 Type 2 diabetes mellitus without complications: Secondary | ICD-10-CM | POA: Diagnosis not present

## 2019-06-26 DIAGNOSIS — R1312 Dysphagia, oropharyngeal phase: Secondary | ICD-10-CM | POA: Diagnosis not present

## 2019-06-26 DIAGNOSIS — D509 Iron deficiency anemia, unspecified: Secondary | ICD-10-CM | POA: Diagnosis not present

## 2019-06-26 DIAGNOSIS — E876 Hypokalemia: Secondary | ICD-10-CM | POA: Diagnosis not present

## 2019-06-26 DIAGNOSIS — G9341 Metabolic encephalopathy: Secondary | ICD-10-CM | POA: Diagnosis not present

## 2019-06-26 DIAGNOSIS — Z87891 Personal history of nicotine dependence: Secondary | ICD-10-CM | POA: Diagnosis not present

## 2019-06-26 DIAGNOSIS — E039 Hypothyroidism, unspecified: Secondary | ICD-10-CM | POA: Diagnosis not present

## 2019-06-26 DIAGNOSIS — J440 Chronic obstructive pulmonary disease with acute lower respiratory infection: Secondary | ICD-10-CM | POA: Diagnosis not present

## 2019-06-26 DIAGNOSIS — M159 Polyosteoarthritis, unspecified: Secondary | ICD-10-CM | POA: Diagnosis not present

## 2019-06-27 DIAGNOSIS — Z87440 Personal history of urinary (tract) infections: Secondary | ICD-10-CM | POA: Diagnosis not present

## 2019-06-27 DIAGNOSIS — I959 Hypotension, unspecified: Secondary | ICD-10-CM | POA: Diagnosis not present

## 2019-06-27 DIAGNOSIS — G9341 Metabolic encephalopathy: Secondary | ICD-10-CM | POA: Diagnosis not present

## 2019-06-27 DIAGNOSIS — N179 Acute kidney failure, unspecified: Secondary | ICD-10-CM | POA: Diagnosis not present

## 2019-06-27 DIAGNOSIS — Z87891 Personal history of nicotine dependence: Secondary | ICD-10-CM | POA: Diagnosis not present

## 2019-06-27 DIAGNOSIS — D509 Iron deficiency anemia, unspecified: Secondary | ICD-10-CM | POA: Diagnosis not present

## 2019-06-27 DIAGNOSIS — Z7984 Long term (current) use of oral hypoglycemic drugs: Secondary | ICD-10-CM | POA: Diagnosis not present

## 2019-06-27 DIAGNOSIS — Z9181 History of falling: Secondary | ICD-10-CM | POA: Diagnosis not present

## 2019-06-27 DIAGNOSIS — E1140 Type 2 diabetes mellitus with diabetic neuropathy, unspecified: Secondary | ICD-10-CM | POA: Diagnosis not present

## 2019-06-27 DIAGNOSIS — E876 Hypokalemia: Secondary | ICD-10-CM | POA: Diagnosis not present

## 2019-06-27 DIAGNOSIS — I110 Hypertensive heart disease with heart failure: Secondary | ICD-10-CM | POA: Diagnosis not present

## 2019-06-27 DIAGNOSIS — R1312 Dysphagia, oropharyngeal phase: Secondary | ICD-10-CM | POA: Diagnosis not present

## 2019-06-27 DIAGNOSIS — M159 Polyosteoarthritis, unspecified: Secondary | ICD-10-CM | POA: Diagnosis not present

## 2019-06-27 DIAGNOSIS — J690 Pneumonitis due to inhalation of food and vomit: Secondary | ICD-10-CM | POA: Diagnosis not present

## 2019-06-27 DIAGNOSIS — M0579 Rheumatoid arthritis with rheumatoid factor of multiple sites without organ or systems involvement: Secondary | ICD-10-CM | POA: Diagnosis not present

## 2019-06-27 DIAGNOSIS — I5032 Chronic diastolic (congestive) heart failure: Secondary | ICD-10-CM | POA: Diagnosis not present

## 2019-06-27 DIAGNOSIS — J440 Chronic obstructive pulmonary disease with acute lower respiratory infection: Secondary | ICD-10-CM | POA: Diagnosis not present

## 2019-06-27 DIAGNOSIS — E119 Type 2 diabetes mellitus without complications: Secondary | ICD-10-CM | POA: Diagnosis not present

## 2019-06-27 DIAGNOSIS — E039 Hypothyroidism, unspecified: Secondary | ICD-10-CM | POA: Diagnosis not present

## 2019-06-27 DIAGNOSIS — E785 Hyperlipidemia, unspecified: Secondary | ICD-10-CM | POA: Diagnosis not present

## 2019-06-27 DIAGNOSIS — J441 Chronic obstructive pulmonary disease with (acute) exacerbation: Secondary | ICD-10-CM | POA: Diagnosis not present

## 2019-06-27 DIAGNOSIS — E1159 Type 2 diabetes mellitus with other circulatory complications: Secondary | ICD-10-CM | POA: Diagnosis not present

## 2019-06-27 DIAGNOSIS — E1169 Type 2 diabetes mellitus with other specified complication: Secondary | ICD-10-CM | POA: Diagnosis not present

## 2019-06-27 DIAGNOSIS — J9621 Acute and chronic respiratory failure with hypoxia: Secondary | ICD-10-CM | POA: Diagnosis not present

## 2019-06-28 DIAGNOSIS — J440 Chronic obstructive pulmonary disease with acute lower respiratory infection: Secondary | ICD-10-CM | POA: Diagnosis not present

## 2019-06-28 DIAGNOSIS — E876 Hypokalemia: Secondary | ICD-10-CM | POA: Diagnosis not present

## 2019-06-28 DIAGNOSIS — M159 Polyosteoarthritis, unspecified: Secondary | ICD-10-CM | POA: Diagnosis not present

## 2019-06-28 DIAGNOSIS — I110 Hypertensive heart disease with heart failure: Secondary | ICD-10-CM | POA: Diagnosis not present

## 2019-06-28 DIAGNOSIS — J9621 Acute and chronic respiratory failure with hypoxia: Secondary | ICD-10-CM | POA: Diagnosis not present

## 2019-06-28 DIAGNOSIS — R1312 Dysphagia, oropharyngeal phase: Secondary | ICD-10-CM | POA: Diagnosis not present

## 2019-06-28 DIAGNOSIS — Z9181 History of falling: Secondary | ICD-10-CM | POA: Diagnosis not present

## 2019-06-28 DIAGNOSIS — Z87891 Personal history of nicotine dependence: Secondary | ICD-10-CM | POA: Diagnosis not present

## 2019-06-28 DIAGNOSIS — E785 Hyperlipidemia, unspecified: Secondary | ICD-10-CM | POA: Diagnosis not present

## 2019-06-28 DIAGNOSIS — D509 Iron deficiency anemia, unspecified: Secondary | ICD-10-CM | POA: Diagnosis not present

## 2019-06-28 DIAGNOSIS — I5032 Chronic diastolic (congestive) heart failure: Secondary | ICD-10-CM | POA: Diagnosis not present

## 2019-06-28 DIAGNOSIS — M0579 Rheumatoid arthritis with rheumatoid factor of multiple sites without organ or systems involvement: Secondary | ICD-10-CM | POA: Diagnosis not present

## 2019-06-28 DIAGNOSIS — J441 Chronic obstructive pulmonary disease with (acute) exacerbation: Secondary | ICD-10-CM | POA: Diagnosis not present

## 2019-06-28 DIAGNOSIS — I959 Hypotension, unspecified: Secondary | ICD-10-CM | POA: Diagnosis not present

## 2019-06-28 DIAGNOSIS — G9341 Metabolic encephalopathy: Secondary | ICD-10-CM | POA: Diagnosis not present

## 2019-06-28 DIAGNOSIS — Z7984 Long term (current) use of oral hypoglycemic drugs: Secondary | ICD-10-CM | POA: Diagnosis not present

## 2019-06-28 DIAGNOSIS — E039 Hypothyroidism, unspecified: Secondary | ICD-10-CM | POA: Diagnosis not present

## 2019-06-28 DIAGNOSIS — N179 Acute kidney failure, unspecified: Secondary | ICD-10-CM | POA: Diagnosis not present

## 2019-06-28 DIAGNOSIS — E119 Type 2 diabetes mellitus without complications: Secondary | ICD-10-CM | POA: Diagnosis not present

## 2019-06-28 DIAGNOSIS — J690 Pneumonitis due to inhalation of food and vomit: Secondary | ICD-10-CM | POA: Diagnosis not present

## 2019-06-28 DIAGNOSIS — Z87440 Personal history of urinary (tract) infections: Secondary | ICD-10-CM | POA: Diagnosis not present

## 2019-06-29 DIAGNOSIS — D509 Iron deficiency anemia, unspecified: Secondary | ICD-10-CM | POA: Diagnosis not present

## 2019-06-29 DIAGNOSIS — E039 Hypothyroidism, unspecified: Secondary | ICD-10-CM | POA: Diagnosis not present

## 2019-06-29 DIAGNOSIS — N179 Acute kidney failure, unspecified: Secondary | ICD-10-CM | POA: Diagnosis not present

## 2019-06-29 DIAGNOSIS — Z87440 Personal history of urinary (tract) infections: Secondary | ICD-10-CM | POA: Diagnosis not present

## 2019-06-29 DIAGNOSIS — G9341 Metabolic encephalopathy: Secondary | ICD-10-CM | POA: Diagnosis not present

## 2019-06-29 DIAGNOSIS — Z7984 Long term (current) use of oral hypoglycemic drugs: Secondary | ICD-10-CM | POA: Diagnosis not present

## 2019-06-29 DIAGNOSIS — I959 Hypotension, unspecified: Secondary | ICD-10-CM | POA: Diagnosis not present

## 2019-06-29 DIAGNOSIS — I110 Hypertensive heart disease with heart failure: Secondary | ICD-10-CM | POA: Diagnosis not present

## 2019-06-29 DIAGNOSIS — E1140 Type 2 diabetes mellitus with diabetic neuropathy, unspecified: Secondary | ICD-10-CM | POA: Diagnosis not present

## 2019-06-29 DIAGNOSIS — E119 Type 2 diabetes mellitus without complications: Secondary | ICD-10-CM | POA: Diagnosis not present

## 2019-06-29 DIAGNOSIS — E1169 Type 2 diabetes mellitus with other specified complication: Secondary | ICD-10-CM | POA: Diagnosis not present

## 2019-06-29 DIAGNOSIS — J441 Chronic obstructive pulmonary disease with (acute) exacerbation: Secondary | ICD-10-CM | POA: Diagnosis not present

## 2019-06-29 DIAGNOSIS — J690 Pneumonitis due to inhalation of food and vomit: Secondary | ICD-10-CM | POA: Diagnosis not present

## 2019-06-29 DIAGNOSIS — I5032 Chronic diastolic (congestive) heart failure: Secondary | ICD-10-CM | POA: Diagnosis not present

## 2019-06-29 DIAGNOSIS — Z87891 Personal history of nicotine dependence: Secondary | ICD-10-CM | POA: Diagnosis not present

## 2019-06-29 DIAGNOSIS — Z9181 History of falling: Secondary | ICD-10-CM | POA: Diagnosis not present

## 2019-06-29 DIAGNOSIS — I152 Hypertension secondary to endocrine disorders: Secondary | ICD-10-CM | POA: Diagnosis not present

## 2019-06-29 DIAGNOSIS — E876 Hypokalemia: Secondary | ICD-10-CM | POA: Diagnosis not present

## 2019-06-29 DIAGNOSIS — M0579 Rheumatoid arthritis with rheumatoid factor of multiple sites without organ or systems involvement: Secondary | ICD-10-CM | POA: Diagnosis not present

## 2019-06-29 DIAGNOSIS — J440 Chronic obstructive pulmonary disease with acute lower respiratory infection: Secondary | ICD-10-CM | POA: Diagnosis not present

## 2019-06-29 DIAGNOSIS — E1159 Type 2 diabetes mellitus with other circulatory complications: Secondary | ICD-10-CM | POA: Diagnosis not present

## 2019-06-29 DIAGNOSIS — R1312 Dysphagia, oropharyngeal phase: Secondary | ICD-10-CM | POA: Diagnosis not present

## 2019-06-29 DIAGNOSIS — M159 Polyosteoarthritis, unspecified: Secondary | ICD-10-CM | POA: Diagnosis not present

## 2019-06-29 DIAGNOSIS — E785 Hyperlipidemia, unspecified: Secondary | ICD-10-CM | POA: Diagnosis not present

## 2019-06-29 DIAGNOSIS — J9621 Acute and chronic respiratory failure with hypoxia: Secondary | ICD-10-CM | POA: Diagnosis not present

## 2019-07-02 DIAGNOSIS — M0579 Rheumatoid arthritis with rheumatoid factor of multiple sites without organ or systems involvement: Secondary | ICD-10-CM | POA: Diagnosis not present

## 2019-07-02 DIAGNOSIS — I110 Hypertensive heart disease with heart failure: Secondary | ICD-10-CM | POA: Diagnosis not present

## 2019-07-02 DIAGNOSIS — N179 Acute kidney failure, unspecified: Secondary | ICD-10-CM | POA: Diagnosis not present

## 2019-07-02 DIAGNOSIS — G9341 Metabolic encephalopathy: Secondary | ICD-10-CM | POA: Diagnosis not present

## 2019-07-02 DIAGNOSIS — E039 Hypothyroidism, unspecified: Secondary | ICD-10-CM | POA: Diagnosis not present

## 2019-07-02 DIAGNOSIS — M159 Polyosteoarthritis, unspecified: Secondary | ICD-10-CM | POA: Diagnosis not present

## 2019-07-02 DIAGNOSIS — R1312 Dysphagia, oropharyngeal phase: Secondary | ICD-10-CM | POA: Diagnosis not present

## 2019-07-02 DIAGNOSIS — J690 Pneumonitis due to inhalation of food and vomit: Secondary | ICD-10-CM | POA: Diagnosis not present

## 2019-07-02 DIAGNOSIS — J9621 Acute and chronic respiratory failure with hypoxia: Secondary | ICD-10-CM | POA: Diagnosis not present

## 2019-07-02 DIAGNOSIS — I5032 Chronic diastolic (congestive) heart failure: Secondary | ICD-10-CM | POA: Diagnosis not present

## 2019-07-02 DIAGNOSIS — Z87440 Personal history of urinary (tract) infections: Secondary | ICD-10-CM | POA: Diagnosis not present

## 2019-07-02 DIAGNOSIS — Z7984 Long term (current) use of oral hypoglycemic drugs: Secondary | ICD-10-CM | POA: Diagnosis not present

## 2019-07-02 DIAGNOSIS — Z9181 History of falling: Secondary | ICD-10-CM | POA: Diagnosis not present

## 2019-07-02 DIAGNOSIS — E119 Type 2 diabetes mellitus without complications: Secondary | ICD-10-CM | POA: Diagnosis not present

## 2019-07-02 DIAGNOSIS — J440 Chronic obstructive pulmonary disease with acute lower respiratory infection: Secondary | ICD-10-CM | POA: Diagnosis not present

## 2019-07-02 DIAGNOSIS — Z87891 Personal history of nicotine dependence: Secondary | ICD-10-CM | POA: Diagnosis not present

## 2019-07-02 DIAGNOSIS — E785 Hyperlipidemia, unspecified: Secondary | ICD-10-CM | POA: Diagnosis not present

## 2019-07-02 DIAGNOSIS — I959 Hypotension, unspecified: Secondary | ICD-10-CM | POA: Diagnosis not present

## 2019-07-02 DIAGNOSIS — E876 Hypokalemia: Secondary | ICD-10-CM | POA: Diagnosis not present

## 2019-07-02 DIAGNOSIS — J441 Chronic obstructive pulmonary disease with (acute) exacerbation: Secondary | ICD-10-CM | POA: Diagnosis not present

## 2019-07-02 DIAGNOSIS — D509 Iron deficiency anemia, unspecified: Secondary | ICD-10-CM | POA: Diagnosis not present

## 2019-07-04 DIAGNOSIS — R1312 Dysphagia, oropharyngeal phase: Secondary | ICD-10-CM | POA: Diagnosis not present

## 2019-07-04 DIAGNOSIS — M0579 Rheumatoid arthritis with rheumatoid factor of multiple sites without organ or systems involvement: Secondary | ICD-10-CM | POA: Diagnosis not present

## 2019-07-04 DIAGNOSIS — N179 Acute kidney failure, unspecified: Secondary | ICD-10-CM | POA: Diagnosis not present

## 2019-07-04 DIAGNOSIS — G9341 Metabolic encephalopathy: Secondary | ICD-10-CM | POA: Diagnosis not present

## 2019-07-04 DIAGNOSIS — E785 Hyperlipidemia, unspecified: Secondary | ICD-10-CM | POA: Diagnosis not present

## 2019-07-04 DIAGNOSIS — Z87440 Personal history of urinary (tract) infections: Secondary | ICD-10-CM | POA: Diagnosis not present

## 2019-07-04 DIAGNOSIS — Z87891 Personal history of nicotine dependence: Secondary | ICD-10-CM | POA: Diagnosis not present

## 2019-07-04 DIAGNOSIS — M159 Polyosteoarthritis, unspecified: Secondary | ICD-10-CM | POA: Diagnosis not present

## 2019-07-04 DIAGNOSIS — I959 Hypotension, unspecified: Secondary | ICD-10-CM | POA: Diagnosis not present

## 2019-07-04 DIAGNOSIS — J440 Chronic obstructive pulmonary disease with acute lower respiratory infection: Secondary | ICD-10-CM | POA: Diagnosis not present

## 2019-07-04 DIAGNOSIS — E876 Hypokalemia: Secondary | ICD-10-CM | POA: Diagnosis not present

## 2019-07-04 DIAGNOSIS — Z7984 Long term (current) use of oral hypoglycemic drugs: Secondary | ICD-10-CM | POA: Diagnosis not present

## 2019-07-04 DIAGNOSIS — D509 Iron deficiency anemia, unspecified: Secondary | ICD-10-CM | POA: Diagnosis not present

## 2019-07-04 DIAGNOSIS — J9621 Acute and chronic respiratory failure with hypoxia: Secondary | ICD-10-CM | POA: Diagnosis not present

## 2019-07-04 DIAGNOSIS — J441 Chronic obstructive pulmonary disease with (acute) exacerbation: Secondary | ICD-10-CM | POA: Diagnosis not present

## 2019-07-04 DIAGNOSIS — Z9181 History of falling: Secondary | ICD-10-CM | POA: Diagnosis not present

## 2019-07-04 DIAGNOSIS — E119 Type 2 diabetes mellitus without complications: Secondary | ICD-10-CM | POA: Diagnosis not present

## 2019-07-04 DIAGNOSIS — E039 Hypothyroidism, unspecified: Secondary | ICD-10-CM | POA: Diagnosis not present

## 2019-07-04 DIAGNOSIS — I110 Hypertensive heart disease with heart failure: Secondary | ICD-10-CM | POA: Diagnosis not present

## 2019-07-04 DIAGNOSIS — J690 Pneumonitis due to inhalation of food and vomit: Secondary | ICD-10-CM | POA: Diagnosis not present

## 2019-07-04 DIAGNOSIS — I5032 Chronic diastolic (congestive) heart failure: Secondary | ICD-10-CM | POA: Diagnosis not present

## 2019-07-05 DIAGNOSIS — E119 Type 2 diabetes mellitus without complications: Secondary | ICD-10-CM | POA: Diagnosis not present

## 2019-07-05 DIAGNOSIS — M0579 Rheumatoid arthritis with rheumatoid factor of multiple sites without organ or systems involvement: Secondary | ICD-10-CM | POA: Diagnosis not present

## 2019-07-05 DIAGNOSIS — Z7984 Long term (current) use of oral hypoglycemic drugs: Secondary | ICD-10-CM | POA: Diagnosis not present

## 2019-07-05 DIAGNOSIS — I959 Hypotension, unspecified: Secondary | ICD-10-CM | POA: Diagnosis not present

## 2019-07-05 DIAGNOSIS — Z87891 Personal history of nicotine dependence: Secondary | ICD-10-CM | POA: Diagnosis not present

## 2019-07-05 DIAGNOSIS — J441 Chronic obstructive pulmonary disease with (acute) exacerbation: Secondary | ICD-10-CM | POA: Diagnosis not present

## 2019-07-05 DIAGNOSIS — D509 Iron deficiency anemia, unspecified: Secondary | ICD-10-CM | POA: Diagnosis not present

## 2019-07-05 DIAGNOSIS — Z9181 History of falling: Secondary | ICD-10-CM | POA: Diagnosis not present

## 2019-07-05 DIAGNOSIS — J9621 Acute and chronic respiratory failure with hypoxia: Secondary | ICD-10-CM | POA: Diagnosis not present

## 2019-07-05 DIAGNOSIS — E039 Hypothyroidism, unspecified: Secondary | ICD-10-CM | POA: Diagnosis not present

## 2019-07-05 DIAGNOSIS — G9341 Metabolic encephalopathy: Secondary | ICD-10-CM | POA: Diagnosis not present

## 2019-07-05 DIAGNOSIS — N179 Acute kidney failure, unspecified: Secondary | ICD-10-CM | POA: Diagnosis not present

## 2019-07-05 DIAGNOSIS — J440 Chronic obstructive pulmonary disease with acute lower respiratory infection: Secondary | ICD-10-CM | POA: Diagnosis not present

## 2019-07-05 DIAGNOSIS — M159 Polyosteoarthritis, unspecified: Secondary | ICD-10-CM | POA: Diagnosis not present

## 2019-07-05 DIAGNOSIS — Z87440 Personal history of urinary (tract) infections: Secondary | ICD-10-CM | POA: Diagnosis not present

## 2019-07-05 DIAGNOSIS — I110 Hypertensive heart disease with heart failure: Secondary | ICD-10-CM | POA: Diagnosis not present

## 2019-07-05 DIAGNOSIS — E785 Hyperlipidemia, unspecified: Secondary | ICD-10-CM | POA: Diagnosis not present

## 2019-07-05 DIAGNOSIS — R1312 Dysphagia, oropharyngeal phase: Secondary | ICD-10-CM | POA: Diagnosis not present

## 2019-07-05 DIAGNOSIS — J690 Pneumonitis due to inhalation of food and vomit: Secondary | ICD-10-CM | POA: Diagnosis not present

## 2019-07-05 DIAGNOSIS — I5032 Chronic diastolic (congestive) heart failure: Secondary | ICD-10-CM | POA: Diagnosis not present

## 2019-07-05 DIAGNOSIS — E876 Hypokalemia: Secondary | ICD-10-CM | POA: Diagnosis not present

## 2019-07-10 DIAGNOSIS — D509 Iron deficiency anemia, unspecified: Secondary | ICD-10-CM | POA: Diagnosis not present

## 2019-07-10 DIAGNOSIS — M159 Polyosteoarthritis, unspecified: Secondary | ICD-10-CM | POA: Diagnosis not present

## 2019-07-10 DIAGNOSIS — E039 Hypothyroidism, unspecified: Secondary | ICD-10-CM | POA: Diagnosis not present

## 2019-07-10 DIAGNOSIS — R1312 Dysphagia, oropharyngeal phase: Secondary | ICD-10-CM | POA: Diagnosis not present

## 2019-07-10 DIAGNOSIS — G9341 Metabolic encephalopathy: Secondary | ICD-10-CM | POA: Diagnosis not present

## 2019-07-10 DIAGNOSIS — M0579 Rheumatoid arthritis with rheumatoid factor of multiple sites without organ or systems involvement: Secondary | ICD-10-CM | POA: Diagnosis not present

## 2019-07-10 DIAGNOSIS — Z87891 Personal history of nicotine dependence: Secondary | ICD-10-CM | POA: Diagnosis not present

## 2019-07-10 DIAGNOSIS — I110 Hypertensive heart disease with heart failure: Secondary | ICD-10-CM | POA: Diagnosis not present

## 2019-07-10 DIAGNOSIS — N179 Acute kidney failure, unspecified: Secondary | ICD-10-CM | POA: Diagnosis not present

## 2019-07-10 DIAGNOSIS — J690 Pneumonitis due to inhalation of food and vomit: Secondary | ICD-10-CM | POA: Diagnosis not present

## 2019-07-10 DIAGNOSIS — E785 Hyperlipidemia, unspecified: Secondary | ICD-10-CM | POA: Diagnosis not present

## 2019-07-10 DIAGNOSIS — Z87440 Personal history of urinary (tract) infections: Secondary | ICD-10-CM | POA: Diagnosis not present

## 2019-07-10 DIAGNOSIS — Z9181 History of falling: Secondary | ICD-10-CM | POA: Diagnosis not present

## 2019-07-10 DIAGNOSIS — J9621 Acute and chronic respiratory failure with hypoxia: Secondary | ICD-10-CM | POA: Diagnosis not present

## 2019-07-10 DIAGNOSIS — Z7984 Long term (current) use of oral hypoglycemic drugs: Secondary | ICD-10-CM | POA: Diagnosis not present

## 2019-07-10 DIAGNOSIS — J441 Chronic obstructive pulmonary disease with (acute) exacerbation: Secondary | ICD-10-CM | POA: Diagnosis not present

## 2019-07-10 DIAGNOSIS — I5032 Chronic diastolic (congestive) heart failure: Secondary | ICD-10-CM | POA: Diagnosis not present

## 2019-07-10 DIAGNOSIS — E876 Hypokalemia: Secondary | ICD-10-CM | POA: Diagnosis not present

## 2019-07-10 DIAGNOSIS — E119 Type 2 diabetes mellitus without complications: Secondary | ICD-10-CM | POA: Diagnosis not present

## 2019-07-10 DIAGNOSIS — J440 Chronic obstructive pulmonary disease with acute lower respiratory infection: Secondary | ICD-10-CM | POA: Diagnosis not present

## 2019-07-10 DIAGNOSIS — I959 Hypotension, unspecified: Secondary | ICD-10-CM | POA: Diagnosis not present

## 2019-07-11 DIAGNOSIS — M159 Polyosteoarthritis, unspecified: Secondary | ICD-10-CM | POA: Diagnosis not present

## 2019-07-11 DIAGNOSIS — E876 Hypokalemia: Secondary | ICD-10-CM | POA: Diagnosis not present

## 2019-07-11 DIAGNOSIS — N179 Acute kidney failure, unspecified: Secondary | ICD-10-CM | POA: Diagnosis not present

## 2019-07-11 DIAGNOSIS — Z7984 Long term (current) use of oral hypoglycemic drugs: Secondary | ICD-10-CM | POA: Diagnosis not present

## 2019-07-11 DIAGNOSIS — R1312 Dysphagia, oropharyngeal phase: Secondary | ICD-10-CM | POA: Diagnosis not present

## 2019-07-11 DIAGNOSIS — M0579 Rheumatoid arthritis with rheumatoid factor of multiple sites without organ or systems involvement: Secondary | ICD-10-CM | POA: Diagnosis not present

## 2019-07-11 DIAGNOSIS — J690 Pneumonitis due to inhalation of food and vomit: Secondary | ICD-10-CM | POA: Diagnosis not present

## 2019-07-11 DIAGNOSIS — Z9181 History of falling: Secondary | ICD-10-CM | POA: Diagnosis not present

## 2019-07-11 DIAGNOSIS — G9341 Metabolic encephalopathy: Secondary | ICD-10-CM | POA: Diagnosis not present

## 2019-07-11 DIAGNOSIS — J440 Chronic obstructive pulmonary disease with acute lower respiratory infection: Secondary | ICD-10-CM | POA: Diagnosis not present

## 2019-07-11 DIAGNOSIS — E039 Hypothyroidism, unspecified: Secondary | ICD-10-CM | POA: Diagnosis not present

## 2019-07-11 DIAGNOSIS — E785 Hyperlipidemia, unspecified: Secondary | ICD-10-CM | POA: Diagnosis not present

## 2019-07-11 DIAGNOSIS — I110 Hypertensive heart disease with heart failure: Secondary | ICD-10-CM | POA: Diagnosis not present

## 2019-07-11 DIAGNOSIS — I959 Hypotension, unspecified: Secondary | ICD-10-CM | POA: Diagnosis not present

## 2019-07-11 DIAGNOSIS — J441 Chronic obstructive pulmonary disease with (acute) exacerbation: Secondary | ICD-10-CM | POA: Diagnosis not present

## 2019-07-11 DIAGNOSIS — E119 Type 2 diabetes mellitus without complications: Secondary | ICD-10-CM | POA: Diagnosis not present

## 2019-07-11 DIAGNOSIS — Z87891 Personal history of nicotine dependence: Secondary | ICD-10-CM | POA: Diagnosis not present

## 2019-07-11 DIAGNOSIS — D509 Iron deficiency anemia, unspecified: Secondary | ICD-10-CM | POA: Diagnosis not present

## 2019-07-11 DIAGNOSIS — Z87440 Personal history of urinary (tract) infections: Secondary | ICD-10-CM | POA: Diagnosis not present

## 2019-07-11 DIAGNOSIS — J9621 Acute and chronic respiratory failure with hypoxia: Secondary | ICD-10-CM | POA: Diagnosis not present

## 2019-07-11 DIAGNOSIS — I5032 Chronic diastolic (congestive) heart failure: Secondary | ICD-10-CM | POA: Diagnosis not present

## 2019-07-12 DIAGNOSIS — G9341 Metabolic encephalopathy: Secondary | ICD-10-CM | POA: Diagnosis not present

## 2019-07-12 DIAGNOSIS — I110 Hypertensive heart disease with heart failure: Secondary | ICD-10-CM | POA: Diagnosis not present

## 2019-07-12 DIAGNOSIS — E876 Hypokalemia: Secondary | ICD-10-CM | POA: Diagnosis not present

## 2019-07-12 DIAGNOSIS — Z87440 Personal history of urinary (tract) infections: Secondary | ICD-10-CM | POA: Diagnosis not present

## 2019-07-12 DIAGNOSIS — R1312 Dysphagia, oropharyngeal phase: Secondary | ICD-10-CM | POA: Diagnosis not present

## 2019-07-12 DIAGNOSIS — E119 Type 2 diabetes mellitus without complications: Secondary | ICD-10-CM | POA: Diagnosis not present

## 2019-07-12 DIAGNOSIS — J440 Chronic obstructive pulmonary disease with acute lower respiratory infection: Secondary | ICD-10-CM | POA: Diagnosis not present

## 2019-07-12 DIAGNOSIS — J441 Chronic obstructive pulmonary disease with (acute) exacerbation: Secondary | ICD-10-CM | POA: Diagnosis not present

## 2019-07-12 DIAGNOSIS — Z7984 Long term (current) use of oral hypoglycemic drugs: Secondary | ICD-10-CM | POA: Diagnosis not present

## 2019-07-12 DIAGNOSIS — I959 Hypotension, unspecified: Secondary | ICD-10-CM | POA: Diagnosis not present

## 2019-07-12 DIAGNOSIS — J690 Pneumonitis due to inhalation of food and vomit: Secondary | ICD-10-CM | POA: Diagnosis not present

## 2019-07-12 DIAGNOSIS — E039 Hypothyroidism, unspecified: Secondary | ICD-10-CM | POA: Diagnosis not present

## 2019-07-12 DIAGNOSIS — E785 Hyperlipidemia, unspecified: Secondary | ICD-10-CM | POA: Diagnosis not present

## 2019-07-12 DIAGNOSIS — J9621 Acute and chronic respiratory failure with hypoxia: Secondary | ICD-10-CM | POA: Diagnosis not present

## 2019-07-12 DIAGNOSIS — D509 Iron deficiency anemia, unspecified: Secondary | ICD-10-CM | POA: Diagnosis not present

## 2019-07-12 DIAGNOSIS — M0579 Rheumatoid arthritis with rheumatoid factor of multiple sites without organ or systems involvement: Secondary | ICD-10-CM | POA: Diagnosis not present

## 2019-07-12 DIAGNOSIS — N179 Acute kidney failure, unspecified: Secondary | ICD-10-CM | POA: Diagnosis not present

## 2019-07-12 DIAGNOSIS — I5032 Chronic diastolic (congestive) heart failure: Secondary | ICD-10-CM | POA: Diagnosis not present

## 2019-07-12 DIAGNOSIS — M159 Polyosteoarthritis, unspecified: Secondary | ICD-10-CM | POA: Diagnosis not present

## 2019-07-12 DIAGNOSIS — Z9181 History of falling: Secondary | ICD-10-CM | POA: Diagnosis not present

## 2019-07-12 DIAGNOSIS — Z87891 Personal history of nicotine dependence: Secondary | ICD-10-CM | POA: Diagnosis not present

## 2019-07-20 DIAGNOSIS — K219 Gastro-esophageal reflux disease without esophagitis: Secondary | ICD-10-CM | POA: Diagnosis not present

## 2019-07-20 DIAGNOSIS — N3081 Other cystitis with hematuria: Secondary | ICD-10-CM | POA: Diagnosis not present

## 2019-07-20 DIAGNOSIS — M25512 Pain in left shoulder: Secondary | ICD-10-CM | POA: Diagnosis not present

## 2019-07-20 DIAGNOSIS — A419 Sepsis, unspecified organism: Secondary | ICD-10-CM | POA: Diagnosis not present

## 2019-07-20 DIAGNOSIS — E1142 Type 2 diabetes mellitus with diabetic polyneuropathy: Secondary | ICD-10-CM | POA: Diagnosis not present

## 2019-07-20 DIAGNOSIS — M069 Rheumatoid arthritis, unspecified: Secondary | ICD-10-CM | POA: Diagnosis not present

## 2019-07-20 DIAGNOSIS — I9589 Other hypotension: Secondary | ICD-10-CM | POA: Diagnosis not present

## 2019-07-20 DIAGNOSIS — M6281 Muscle weakness (generalized): Secondary | ICD-10-CM | POA: Diagnosis not present

## 2019-07-20 DIAGNOSIS — J449 Chronic obstructive pulmonary disease, unspecified: Secondary | ICD-10-CM

## 2019-07-20 DIAGNOSIS — R0602 Shortness of breath: Secondary | ICD-10-CM | POA: Diagnosis not present

## 2019-07-20 DIAGNOSIS — G9341 Metabolic encephalopathy: Secondary | ICD-10-CM | POA: Diagnosis not present

## 2019-07-20 DIAGNOSIS — J440 Chronic obstructive pulmonary disease with acute lower respiratory infection: Secondary | ICD-10-CM | POA: Diagnosis not present

## 2019-07-20 DIAGNOSIS — M0609 Rheumatoid arthritis without rheumatoid factor, multiple sites: Secondary | ICD-10-CM | POA: Diagnosis not present

## 2019-07-20 DIAGNOSIS — E1140 Type 2 diabetes mellitus with diabetic neuropathy, unspecified: Secondary | ICD-10-CM | POA: Diagnosis not present

## 2019-07-20 DIAGNOSIS — B9620 Unspecified Escherichia coli [E. coli] as the cause of diseases classified elsewhere: Secondary | ICD-10-CM | POA: Diagnosis not present

## 2019-07-20 DIAGNOSIS — I10 Essential (primary) hypertension: Secondary | ICD-10-CM | POA: Diagnosis not present

## 2019-07-20 DIAGNOSIS — J159 Unspecified bacterial pneumonia: Secondary | ICD-10-CM | POA: Diagnosis not present

## 2019-07-20 DIAGNOSIS — D62 Acute posthemorrhagic anemia: Secondary | ICD-10-CM | POA: Diagnosis not present

## 2019-07-20 DIAGNOSIS — E7800 Pure hypercholesterolemia, unspecified: Secondary | ICD-10-CM | POA: Diagnosis not present

## 2019-07-20 DIAGNOSIS — Z7984 Long term (current) use of oral hypoglycemic drugs: Secondary | ICD-10-CM | POA: Diagnosis not present

## 2019-07-20 DIAGNOSIS — N3000 Acute cystitis without hematuria: Secondary | ICD-10-CM | POA: Diagnosis not present

## 2019-07-20 DIAGNOSIS — E871 Hypo-osmolality and hyponatremia: Secondary | ICD-10-CM | POA: Diagnosis not present

## 2019-07-20 DIAGNOSIS — G301 Alzheimer's disease with late onset: Secondary | ICD-10-CM | POA: Diagnosis not present

## 2019-07-20 DIAGNOSIS — R5081 Fever presenting with conditions classified elsewhere: Secondary | ICD-10-CM | POA: Diagnosis not present

## 2019-07-20 DIAGNOSIS — E039 Hypothyroidism, unspecified: Secondary | ICD-10-CM | POA: Diagnosis not present

## 2019-07-20 DIAGNOSIS — N3001 Acute cystitis with hematuria: Secondary | ICD-10-CM | POA: Diagnosis not present

## 2019-07-20 DIAGNOSIS — J441 Chronic obstructive pulmonary disease with (acute) exacerbation: Secondary | ICD-10-CM | POA: Diagnosis not present

## 2019-07-20 DIAGNOSIS — J158 Pneumonia due to other specified bacteria: Secondary | ICD-10-CM | POA: Diagnosis not present

## 2019-07-20 DIAGNOSIS — I959 Hypotension, unspecified: Secondary | ICD-10-CM | POA: Diagnosis not present

## 2019-07-20 DIAGNOSIS — R531 Weakness: Secondary | ICD-10-CM | POA: Diagnosis not present

## 2019-07-20 DIAGNOSIS — Z79899 Other long term (current) drug therapy: Secondary | ICD-10-CM | POA: Diagnosis not present

## 2019-07-20 DIAGNOSIS — Z7952 Long term (current) use of systemic steroids: Secondary | ICD-10-CM | POA: Diagnosis not present

## 2019-07-21 DIAGNOSIS — J159 Unspecified bacterial pneumonia: Secondary | ICD-10-CM | POA: Diagnosis not present

## 2019-07-21 DIAGNOSIS — A419 Sepsis, unspecified organism: Secondary | ICD-10-CM | POA: Diagnosis not present

## 2019-07-21 DIAGNOSIS — N3001 Acute cystitis with hematuria: Secondary | ICD-10-CM | POA: Diagnosis not present

## 2019-07-21 DIAGNOSIS — E1142 Type 2 diabetes mellitus with diabetic polyneuropathy: Secondary | ICD-10-CM | POA: Diagnosis not present

## 2019-07-22 DIAGNOSIS — N3001 Acute cystitis with hematuria: Secondary | ICD-10-CM | POA: Diagnosis not present

## 2019-07-22 DIAGNOSIS — E1142 Type 2 diabetes mellitus with diabetic polyneuropathy: Secondary | ICD-10-CM | POA: Diagnosis not present

## 2019-07-22 DIAGNOSIS — J159 Unspecified bacterial pneumonia: Secondary | ICD-10-CM | POA: Diagnosis not present

## 2019-07-22 DIAGNOSIS — A419 Sepsis, unspecified organism: Secondary | ICD-10-CM | POA: Diagnosis not present

## 2019-07-23 DIAGNOSIS — N3001 Acute cystitis with hematuria: Secondary | ICD-10-CM | POA: Diagnosis not present

## 2019-07-23 DIAGNOSIS — J159 Unspecified bacterial pneumonia: Secondary | ICD-10-CM | POA: Diagnosis not present

## 2019-07-23 DIAGNOSIS — A419 Sepsis, unspecified organism: Secondary | ICD-10-CM | POA: Diagnosis not present

## 2019-07-23 DIAGNOSIS — E1142 Type 2 diabetes mellitus with diabetic polyneuropathy: Secondary | ICD-10-CM | POA: Diagnosis not present

## 2019-07-24 DIAGNOSIS — A419 Sepsis, unspecified organism: Secondary | ICD-10-CM | POA: Diagnosis not present

## 2019-07-24 DIAGNOSIS — J159 Unspecified bacterial pneumonia: Secondary | ICD-10-CM | POA: Diagnosis not present

## 2019-07-24 DIAGNOSIS — E1142 Type 2 diabetes mellitus with diabetic polyneuropathy: Secondary | ICD-10-CM | POA: Diagnosis not present

## 2019-07-24 DIAGNOSIS — N3001 Acute cystitis with hematuria: Secondary | ICD-10-CM | POA: Diagnosis not present

## 2019-07-25 DIAGNOSIS — M0609 Rheumatoid arthritis without rheumatoid factor, multiple sites: Secondary | ICD-10-CM | POA: Diagnosis not present

## 2019-07-25 DIAGNOSIS — J441 Chronic obstructive pulmonary disease with (acute) exacerbation: Secondary | ICD-10-CM | POA: Diagnosis not present

## 2019-07-25 DIAGNOSIS — M25512 Pain in left shoulder: Secondary | ICD-10-CM | POA: Diagnosis not present

## 2019-07-25 DIAGNOSIS — J158 Pneumonia due to other specified bacteria: Secondary | ICD-10-CM | POA: Diagnosis not present

## 2019-07-25 DIAGNOSIS — M6281 Muscle weakness (generalized): Secondary | ICD-10-CM | POA: Diagnosis not present

## 2019-07-25 DIAGNOSIS — N3081 Other cystitis with hematuria: Secondary | ICD-10-CM | POA: Diagnosis not present

## 2019-07-25 DIAGNOSIS — E1140 Type 2 diabetes mellitus with diabetic neuropathy, unspecified: Secondary | ICD-10-CM | POA: Diagnosis not present

## 2019-07-25 DIAGNOSIS — A419 Sepsis, unspecified organism: Secondary | ICD-10-CM | POA: Diagnosis not present

## 2019-07-25 DIAGNOSIS — B9629 Other Escherichia coli [E. coli] as the cause of diseases classified elsewhere: Secondary | ICD-10-CM | POA: Diagnosis not present

## 2019-07-25 DIAGNOSIS — R531 Weakness: Secondary | ICD-10-CM | POA: Diagnosis not present

## 2019-08-07 DIAGNOSIS — B9629 Other Escherichia coli [E. coli] as the cause of diseases classified elsewhere: Secondary | ICD-10-CM | POA: Diagnosis not present

## 2019-08-07 DIAGNOSIS — E1140 Type 2 diabetes mellitus with diabetic neuropathy, unspecified: Secondary | ICD-10-CM | POA: Diagnosis not present

## 2019-08-07 DIAGNOSIS — R531 Weakness: Secondary | ICD-10-CM | POA: Diagnosis not present

## 2019-08-13 DIAGNOSIS — E1140 Type 2 diabetes mellitus with diabetic neuropathy, unspecified: Secondary | ICD-10-CM | POA: Diagnosis not present

## 2019-08-13 DIAGNOSIS — E785 Hyperlipidemia, unspecified: Secondary | ICD-10-CM | POA: Diagnosis not present

## 2019-08-13 DIAGNOSIS — N3081 Other cystitis with hematuria: Secondary | ICD-10-CM | POA: Diagnosis not present

## 2019-08-13 DIAGNOSIS — I10 Essential (primary) hypertension: Secondary | ICD-10-CM | POA: Diagnosis not present

## 2019-08-13 DIAGNOSIS — K5900 Constipation, unspecified: Secondary | ICD-10-CM | POA: Diagnosis not present

## 2019-08-13 DIAGNOSIS — M6281 Muscle weakness (generalized): Secondary | ICD-10-CM | POA: Diagnosis not present

## 2019-08-13 DIAGNOSIS — Z7951 Long term (current) use of inhaled steroids: Secondary | ICD-10-CM | POA: Diagnosis not present

## 2019-08-13 DIAGNOSIS — Z7984 Long term (current) use of oral hypoglycemic drugs: Secondary | ICD-10-CM | POA: Diagnosis not present

## 2019-08-13 DIAGNOSIS — K219 Gastro-esophageal reflux disease without esophagitis: Secondary | ICD-10-CM | POA: Diagnosis not present

## 2019-08-13 DIAGNOSIS — M25512 Pain in left shoulder: Secondary | ICD-10-CM | POA: Diagnosis not present

## 2019-08-13 DIAGNOSIS — E039 Hypothyroidism, unspecified: Secondary | ICD-10-CM | POA: Diagnosis not present

## 2019-08-13 DIAGNOSIS — J441 Chronic obstructive pulmonary disease with (acute) exacerbation: Secondary | ICD-10-CM | POA: Diagnosis not present

## 2019-08-13 DIAGNOSIS — E1169 Type 2 diabetes mellitus with other specified complication: Secondary | ICD-10-CM | POA: Diagnosis not present

## 2019-08-13 DIAGNOSIS — M0609 Rheumatoid arthritis without rheumatoid factor, multiple sites: Secondary | ICD-10-CM | POA: Diagnosis not present

## 2019-08-14 DIAGNOSIS — K219 Gastro-esophageal reflux disease without esophagitis: Secondary | ICD-10-CM | POA: Diagnosis not present

## 2019-08-14 DIAGNOSIS — M25512 Pain in left shoulder: Secondary | ICD-10-CM | POA: Diagnosis not present

## 2019-08-14 DIAGNOSIS — E039 Hypothyroidism, unspecified: Secondary | ICD-10-CM | POA: Diagnosis not present

## 2019-08-14 DIAGNOSIS — K5900 Constipation, unspecified: Secondary | ICD-10-CM | POA: Diagnosis not present

## 2019-08-14 DIAGNOSIS — J441 Chronic obstructive pulmonary disease with (acute) exacerbation: Secondary | ICD-10-CM | POA: Diagnosis not present

## 2019-08-14 DIAGNOSIS — E785 Hyperlipidemia, unspecified: Secondary | ICD-10-CM | POA: Diagnosis not present

## 2019-08-14 DIAGNOSIS — Z7951 Long term (current) use of inhaled steroids: Secondary | ICD-10-CM | POA: Diagnosis not present

## 2019-08-14 DIAGNOSIS — M6281 Muscle weakness (generalized): Secondary | ICD-10-CM | POA: Diagnosis not present

## 2019-08-14 DIAGNOSIS — E1140 Type 2 diabetes mellitus with diabetic neuropathy, unspecified: Secondary | ICD-10-CM | POA: Diagnosis not present

## 2019-08-14 DIAGNOSIS — Z7984 Long term (current) use of oral hypoglycemic drugs: Secondary | ICD-10-CM | POA: Diagnosis not present

## 2019-08-14 DIAGNOSIS — M0609 Rheumatoid arthritis without rheumatoid factor, multiple sites: Secondary | ICD-10-CM | POA: Diagnosis not present

## 2019-08-14 DIAGNOSIS — I10 Essential (primary) hypertension: Secondary | ICD-10-CM | POA: Diagnosis not present

## 2019-08-14 DIAGNOSIS — N3081 Other cystitis with hematuria: Secondary | ICD-10-CM | POA: Diagnosis not present

## 2019-08-14 DIAGNOSIS — E1169 Type 2 diabetes mellitus with other specified complication: Secondary | ICD-10-CM | POA: Diagnosis not present

## 2019-08-17 DIAGNOSIS — E1169 Type 2 diabetes mellitus with other specified complication: Secondary | ICD-10-CM | POA: Diagnosis not present

## 2019-08-17 DIAGNOSIS — E785 Hyperlipidemia, unspecified: Secondary | ICD-10-CM | POA: Diagnosis not present

## 2019-08-17 DIAGNOSIS — I10 Essential (primary) hypertension: Secondary | ICD-10-CM | POA: Diagnosis not present

## 2019-08-17 DIAGNOSIS — Z7984 Long term (current) use of oral hypoglycemic drugs: Secondary | ICD-10-CM | POA: Diagnosis not present

## 2019-08-17 DIAGNOSIS — K219 Gastro-esophageal reflux disease without esophagitis: Secondary | ICD-10-CM | POA: Diagnosis not present

## 2019-08-17 DIAGNOSIS — M0609 Rheumatoid arthritis without rheumatoid factor, multiple sites: Secondary | ICD-10-CM | POA: Diagnosis not present

## 2019-08-17 DIAGNOSIS — K5900 Constipation, unspecified: Secondary | ICD-10-CM | POA: Diagnosis not present

## 2019-08-17 DIAGNOSIS — Z7951 Long term (current) use of inhaled steroids: Secondary | ICD-10-CM | POA: Diagnosis not present

## 2019-08-17 DIAGNOSIS — E1140 Type 2 diabetes mellitus with diabetic neuropathy, unspecified: Secondary | ICD-10-CM | POA: Diagnosis not present

## 2019-08-17 DIAGNOSIS — M6281 Muscle weakness (generalized): Secondary | ICD-10-CM | POA: Diagnosis not present

## 2019-08-17 DIAGNOSIS — M25512 Pain in left shoulder: Secondary | ICD-10-CM | POA: Diagnosis not present

## 2019-08-17 DIAGNOSIS — J441 Chronic obstructive pulmonary disease with (acute) exacerbation: Secondary | ICD-10-CM | POA: Diagnosis not present

## 2019-08-17 DIAGNOSIS — N3081 Other cystitis with hematuria: Secondary | ICD-10-CM | POA: Diagnosis not present

## 2019-08-17 DIAGNOSIS — E039 Hypothyroidism, unspecified: Secondary | ICD-10-CM | POA: Diagnosis not present

## 2019-08-20 DIAGNOSIS — M0609 Rheumatoid arthritis without rheumatoid factor, multiple sites: Secondary | ICD-10-CM | POA: Diagnosis not present

## 2019-08-20 DIAGNOSIS — E1140 Type 2 diabetes mellitus with diabetic neuropathy, unspecified: Secondary | ICD-10-CM | POA: Diagnosis not present

## 2019-08-20 DIAGNOSIS — K5900 Constipation, unspecified: Secondary | ICD-10-CM | POA: Diagnosis not present

## 2019-08-20 DIAGNOSIS — J441 Chronic obstructive pulmonary disease with (acute) exacerbation: Secondary | ICD-10-CM | POA: Diagnosis not present

## 2019-08-20 DIAGNOSIS — N3081 Other cystitis with hematuria: Secondary | ICD-10-CM | POA: Diagnosis not present

## 2019-08-20 DIAGNOSIS — I10 Essential (primary) hypertension: Secondary | ICD-10-CM | POA: Diagnosis not present

## 2019-08-20 DIAGNOSIS — K219 Gastro-esophageal reflux disease without esophagitis: Secondary | ICD-10-CM | POA: Diagnosis not present

## 2019-08-20 DIAGNOSIS — Z7951 Long term (current) use of inhaled steroids: Secondary | ICD-10-CM | POA: Diagnosis not present

## 2019-08-20 DIAGNOSIS — E785 Hyperlipidemia, unspecified: Secondary | ICD-10-CM | POA: Diagnosis not present

## 2019-08-20 DIAGNOSIS — E1169 Type 2 diabetes mellitus with other specified complication: Secondary | ICD-10-CM | POA: Diagnosis not present

## 2019-08-20 DIAGNOSIS — M25512 Pain in left shoulder: Secondary | ICD-10-CM | POA: Diagnosis not present

## 2019-08-20 DIAGNOSIS — Z7984 Long term (current) use of oral hypoglycemic drugs: Secondary | ICD-10-CM | POA: Diagnosis not present

## 2019-08-20 DIAGNOSIS — M6281 Muscle weakness (generalized): Secondary | ICD-10-CM | POA: Diagnosis not present

## 2019-08-20 DIAGNOSIS — E039 Hypothyroidism, unspecified: Secondary | ICD-10-CM | POA: Diagnosis not present

## 2019-08-22 DIAGNOSIS — J441 Chronic obstructive pulmonary disease with (acute) exacerbation: Secondary | ICD-10-CM | POA: Diagnosis not present

## 2019-08-22 DIAGNOSIS — L659 Nonscarring hair loss, unspecified: Secondary | ICD-10-CM | POA: Diagnosis not present

## 2019-08-22 DIAGNOSIS — M6281 Muscle weakness (generalized): Secondary | ICD-10-CM | POA: Diagnosis not present

## 2019-08-22 DIAGNOSIS — Z7951 Long term (current) use of inhaled steroids: Secondary | ICD-10-CM | POA: Diagnosis not present

## 2019-08-22 DIAGNOSIS — K5900 Constipation, unspecified: Secondary | ICD-10-CM | POA: Diagnosis not present

## 2019-08-22 DIAGNOSIS — I509 Heart failure, unspecified: Secondary | ICD-10-CM | POA: Diagnosis not present

## 2019-08-22 DIAGNOSIS — M0609 Rheumatoid arthritis without rheumatoid factor, multiple sites: Secondary | ICD-10-CM | POA: Diagnosis not present

## 2019-08-22 DIAGNOSIS — B379 Candidiasis, unspecified: Secondary | ICD-10-CM | POA: Diagnosis not present

## 2019-08-22 DIAGNOSIS — I10 Essential (primary) hypertension: Secondary | ICD-10-CM | POA: Diagnosis not present

## 2019-08-22 DIAGNOSIS — Z7984 Long term (current) use of oral hypoglycemic drugs: Secondary | ICD-10-CM | POA: Diagnosis not present

## 2019-08-22 DIAGNOSIS — E1140 Type 2 diabetes mellitus with diabetic neuropathy, unspecified: Secondary | ICD-10-CM | POA: Diagnosis not present

## 2019-08-22 DIAGNOSIS — E1169 Type 2 diabetes mellitus with other specified complication: Secondary | ICD-10-CM | POA: Diagnosis not present

## 2019-08-22 DIAGNOSIS — M25512 Pain in left shoulder: Secondary | ICD-10-CM | POA: Diagnosis not present

## 2019-08-22 DIAGNOSIS — E785 Hyperlipidemia, unspecified: Secondary | ICD-10-CM | POA: Diagnosis not present

## 2019-08-22 DIAGNOSIS — E039 Hypothyroidism, unspecified: Secondary | ICD-10-CM | POA: Diagnosis not present

## 2019-08-22 DIAGNOSIS — K219 Gastro-esophageal reflux disease without esophagitis: Secondary | ICD-10-CM | POA: Diagnosis not present

## 2019-08-22 DIAGNOSIS — N3081 Other cystitis with hematuria: Secondary | ICD-10-CM | POA: Diagnosis not present

## 2019-08-31 DIAGNOSIS — J441 Chronic obstructive pulmonary disease with (acute) exacerbation: Secondary | ICD-10-CM | POA: Diagnosis not present

## 2019-08-31 DIAGNOSIS — M0609 Rheumatoid arthritis without rheumatoid factor, multiple sites: Secondary | ICD-10-CM | POA: Diagnosis not present

## 2019-08-31 DIAGNOSIS — K5900 Constipation, unspecified: Secondary | ICD-10-CM | POA: Diagnosis not present

## 2019-08-31 DIAGNOSIS — Z7984 Long term (current) use of oral hypoglycemic drugs: Secondary | ICD-10-CM | POA: Diagnosis not present

## 2019-08-31 DIAGNOSIS — E1140 Type 2 diabetes mellitus with diabetic neuropathy, unspecified: Secondary | ICD-10-CM | POA: Diagnosis not present

## 2019-08-31 DIAGNOSIS — E1169 Type 2 diabetes mellitus with other specified complication: Secondary | ICD-10-CM | POA: Diagnosis not present

## 2019-08-31 DIAGNOSIS — E039 Hypothyroidism, unspecified: Secondary | ICD-10-CM | POA: Diagnosis not present

## 2019-08-31 DIAGNOSIS — M6281 Muscle weakness (generalized): Secondary | ICD-10-CM | POA: Diagnosis not present

## 2019-08-31 DIAGNOSIS — I10 Essential (primary) hypertension: Secondary | ICD-10-CM | POA: Diagnosis not present

## 2019-08-31 DIAGNOSIS — Z7951 Long term (current) use of inhaled steroids: Secondary | ICD-10-CM | POA: Diagnosis not present

## 2019-08-31 DIAGNOSIS — N3081 Other cystitis with hematuria: Secondary | ICD-10-CM | POA: Diagnosis not present

## 2019-08-31 DIAGNOSIS — E785 Hyperlipidemia, unspecified: Secondary | ICD-10-CM | POA: Diagnosis not present

## 2019-08-31 DIAGNOSIS — M25512 Pain in left shoulder: Secondary | ICD-10-CM | POA: Diagnosis not present

## 2019-08-31 DIAGNOSIS — K219 Gastro-esophageal reflux disease without esophagitis: Secondary | ICD-10-CM | POA: Diagnosis not present

## 2019-09-03 DIAGNOSIS — N3081 Other cystitis with hematuria: Secondary | ICD-10-CM | POA: Diagnosis not present

## 2019-09-03 DIAGNOSIS — M6281 Muscle weakness (generalized): Secondary | ICD-10-CM | POA: Diagnosis not present

## 2019-09-03 DIAGNOSIS — E1140 Type 2 diabetes mellitus with diabetic neuropathy, unspecified: Secondary | ICD-10-CM | POA: Diagnosis not present

## 2019-09-03 DIAGNOSIS — M0609 Rheumatoid arthritis without rheumatoid factor, multiple sites: Secondary | ICD-10-CM | POA: Diagnosis not present

## 2019-09-03 DIAGNOSIS — J441 Chronic obstructive pulmonary disease with (acute) exacerbation: Secondary | ICD-10-CM | POA: Diagnosis not present

## 2019-09-03 DIAGNOSIS — I10 Essential (primary) hypertension: Secondary | ICD-10-CM | POA: Diagnosis not present

## 2019-09-03 DIAGNOSIS — M25512 Pain in left shoulder: Secondary | ICD-10-CM | POA: Diagnosis not present

## 2019-09-03 DIAGNOSIS — K5900 Constipation, unspecified: Secondary | ICD-10-CM | POA: Diagnosis not present

## 2019-09-03 DIAGNOSIS — E039 Hypothyroidism, unspecified: Secondary | ICD-10-CM | POA: Diagnosis not present

## 2019-09-03 DIAGNOSIS — E1169 Type 2 diabetes mellitus with other specified complication: Secondary | ICD-10-CM | POA: Diagnosis not present

## 2019-09-03 DIAGNOSIS — E785 Hyperlipidemia, unspecified: Secondary | ICD-10-CM | POA: Diagnosis not present

## 2019-09-03 DIAGNOSIS — Z7951 Long term (current) use of inhaled steroids: Secondary | ICD-10-CM | POA: Diagnosis not present

## 2019-09-03 DIAGNOSIS — K219 Gastro-esophageal reflux disease without esophagitis: Secondary | ICD-10-CM | POA: Diagnosis not present

## 2019-09-03 DIAGNOSIS — Z7984 Long term (current) use of oral hypoglycemic drugs: Secondary | ICD-10-CM | POA: Diagnosis not present

## 2019-09-13 DIAGNOSIS — M25512 Pain in left shoulder: Secondary | ICD-10-CM | POA: Diagnosis not present

## 2019-09-13 DIAGNOSIS — E1169 Type 2 diabetes mellitus with other specified complication: Secondary | ICD-10-CM | POA: Diagnosis not present

## 2019-09-13 DIAGNOSIS — M0609 Rheumatoid arthritis without rheumatoid factor, multiple sites: Secondary | ICD-10-CM | POA: Diagnosis not present

## 2019-09-13 DIAGNOSIS — J441 Chronic obstructive pulmonary disease with (acute) exacerbation: Secondary | ICD-10-CM | POA: Diagnosis not present

## 2019-09-13 DIAGNOSIS — Z7951 Long term (current) use of inhaled steroids: Secondary | ICD-10-CM | POA: Diagnosis not present

## 2019-09-13 DIAGNOSIS — E785 Hyperlipidemia, unspecified: Secondary | ICD-10-CM | POA: Diagnosis not present

## 2019-09-13 DIAGNOSIS — Z7984 Long term (current) use of oral hypoglycemic drugs: Secondary | ICD-10-CM | POA: Diagnosis not present

## 2019-09-13 DIAGNOSIS — E039 Hypothyroidism, unspecified: Secondary | ICD-10-CM | POA: Diagnosis not present

## 2019-09-13 DIAGNOSIS — K5900 Constipation, unspecified: Secondary | ICD-10-CM | POA: Diagnosis not present

## 2019-09-13 DIAGNOSIS — N3081 Other cystitis with hematuria: Secondary | ICD-10-CM | POA: Diagnosis not present

## 2019-09-13 DIAGNOSIS — E1140 Type 2 diabetes mellitus with diabetic neuropathy, unspecified: Secondary | ICD-10-CM | POA: Diagnosis not present

## 2019-09-13 DIAGNOSIS — I10 Essential (primary) hypertension: Secondary | ICD-10-CM | POA: Diagnosis not present

## 2019-09-13 DIAGNOSIS — K219 Gastro-esophageal reflux disease without esophagitis: Secondary | ICD-10-CM | POA: Diagnosis not present

## 2019-09-13 DIAGNOSIS — M6281 Muscle weakness (generalized): Secondary | ICD-10-CM | POA: Diagnosis not present

## 2019-09-20 DIAGNOSIS — K5900 Constipation, unspecified: Secondary | ICD-10-CM | POA: Diagnosis not present

## 2019-09-20 DIAGNOSIS — M6281 Muscle weakness (generalized): Secondary | ICD-10-CM | POA: Diagnosis not present

## 2019-09-20 DIAGNOSIS — E1140 Type 2 diabetes mellitus with diabetic neuropathy, unspecified: Secondary | ICD-10-CM | POA: Diagnosis not present

## 2019-09-20 DIAGNOSIS — J441 Chronic obstructive pulmonary disease with (acute) exacerbation: Secondary | ICD-10-CM | POA: Diagnosis not present

## 2019-09-20 DIAGNOSIS — E1169 Type 2 diabetes mellitus with other specified complication: Secondary | ICD-10-CM | POA: Diagnosis not present

## 2019-09-20 DIAGNOSIS — M25512 Pain in left shoulder: Secondary | ICD-10-CM | POA: Diagnosis not present

## 2019-09-20 DIAGNOSIS — Z7951 Long term (current) use of inhaled steroids: Secondary | ICD-10-CM | POA: Diagnosis not present

## 2019-09-20 DIAGNOSIS — I10 Essential (primary) hypertension: Secondary | ICD-10-CM | POA: Diagnosis not present

## 2019-09-20 DIAGNOSIS — M0609 Rheumatoid arthritis without rheumatoid factor, multiple sites: Secondary | ICD-10-CM | POA: Diagnosis not present

## 2019-09-20 DIAGNOSIS — E785 Hyperlipidemia, unspecified: Secondary | ICD-10-CM | POA: Diagnosis not present

## 2019-09-20 DIAGNOSIS — N3081 Other cystitis with hematuria: Secondary | ICD-10-CM | POA: Diagnosis not present

## 2019-09-20 DIAGNOSIS — E039 Hypothyroidism, unspecified: Secondary | ICD-10-CM | POA: Diagnosis not present

## 2019-09-20 DIAGNOSIS — Z7984 Long term (current) use of oral hypoglycemic drugs: Secondary | ICD-10-CM | POA: Diagnosis not present

## 2019-09-20 DIAGNOSIS — K219 Gastro-esophageal reflux disease without esophagitis: Secondary | ICD-10-CM | POA: Diagnosis not present

## 2019-10-04 DIAGNOSIS — N3081 Other cystitis with hematuria: Secondary | ICD-10-CM | POA: Diagnosis not present

## 2019-10-04 DIAGNOSIS — Z7951 Long term (current) use of inhaled steroids: Secondary | ICD-10-CM | POA: Diagnosis not present

## 2019-10-04 DIAGNOSIS — E039 Hypothyroidism, unspecified: Secondary | ICD-10-CM | POA: Diagnosis not present

## 2019-10-04 DIAGNOSIS — M6281 Muscle weakness (generalized): Secondary | ICD-10-CM | POA: Diagnosis not present

## 2019-10-04 DIAGNOSIS — K5900 Constipation, unspecified: Secondary | ICD-10-CM | POA: Diagnosis not present

## 2019-10-04 DIAGNOSIS — M0609 Rheumatoid arthritis without rheumatoid factor, multiple sites: Secondary | ICD-10-CM | POA: Diagnosis not present

## 2019-10-04 DIAGNOSIS — J441 Chronic obstructive pulmonary disease with (acute) exacerbation: Secondary | ICD-10-CM | POA: Diagnosis not present

## 2019-10-04 DIAGNOSIS — M25512 Pain in left shoulder: Secondary | ICD-10-CM | POA: Diagnosis not present

## 2019-10-04 DIAGNOSIS — K219 Gastro-esophageal reflux disease without esophagitis: Secondary | ICD-10-CM | POA: Diagnosis not present

## 2019-10-04 DIAGNOSIS — E785 Hyperlipidemia, unspecified: Secondary | ICD-10-CM | POA: Diagnosis not present

## 2019-10-04 DIAGNOSIS — E1169 Type 2 diabetes mellitus with other specified complication: Secondary | ICD-10-CM | POA: Diagnosis not present

## 2019-10-04 DIAGNOSIS — I10 Essential (primary) hypertension: Secondary | ICD-10-CM | POA: Diagnosis not present

## 2019-10-04 DIAGNOSIS — E1140 Type 2 diabetes mellitus with diabetic neuropathy, unspecified: Secondary | ICD-10-CM | POA: Diagnosis not present

## 2019-10-04 DIAGNOSIS — Z7984 Long term (current) use of oral hypoglycemic drugs: Secondary | ICD-10-CM | POA: Diagnosis not present

## 2019-11-01 DIAGNOSIS — R05 Cough: Secondary | ICD-10-CM | POA: Diagnosis not present

## 2019-11-01 DIAGNOSIS — I10 Essential (primary) hypertension: Secondary | ICD-10-CM | POA: Diagnosis not present

## 2019-11-01 DIAGNOSIS — I700 Atherosclerosis of aorta: Secondary | ICD-10-CM | POA: Diagnosis not present

## 2019-11-01 DIAGNOSIS — J189 Pneumonia, unspecified organism: Secondary | ICD-10-CM | POA: Diagnosis not present

## 2019-11-01 DIAGNOSIS — J9811 Atelectasis: Secondary | ICD-10-CM | POA: Diagnosis not present

## 2019-11-01 DIAGNOSIS — R531 Weakness: Secondary | ICD-10-CM | POA: Diagnosis not present

## 2019-11-01 DIAGNOSIS — J984 Other disorders of lung: Secondary | ICD-10-CM | POA: Diagnosis not present

## 2019-11-01 DIAGNOSIS — I2510 Atherosclerotic heart disease of native coronary artery without angina pectoris: Secondary | ICD-10-CM | POA: Diagnosis not present

## 2019-11-01 DIAGNOSIS — Z79899 Other long term (current) drug therapy: Secondary | ICD-10-CM | POA: Diagnosis not present

## 2019-11-01 DIAGNOSIS — E875 Hyperkalemia: Secondary | ICD-10-CM | POA: Diagnosis not present

## 2019-11-01 DIAGNOSIS — J440 Chronic obstructive pulmonary disease with acute lower respiratory infection: Secondary | ICD-10-CM | POA: Diagnosis not present

## 2019-11-01 DIAGNOSIS — K449 Diaphragmatic hernia without obstruction or gangrene: Secondary | ICD-10-CM | POA: Diagnosis not present

## 2019-11-07 DIAGNOSIS — E1169 Type 2 diabetes mellitus with other specified complication: Secondary | ICD-10-CM | POA: Diagnosis not present

## 2019-11-16 DIAGNOSIS — J159 Unspecified bacterial pneumonia: Secondary | ICD-10-CM | POA: Diagnosis not present

## 2019-11-16 DIAGNOSIS — E1169 Type 2 diabetes mellitus with other specified complication: Secondary | ICD-10-CM | POA: Diagnosis not present

## 2019-11-16 DIAGNOSIS — E785 Hyperlipidemia, unspecified: Secondary | ICD-10-CM | POA: Diagnosis not present

## 2019-11-21 DIAGNOSIS — J159 Unspecified bacterial pneumonia: Secondary | ICD-10-CM | POA: Diagnosis not present

## 2019-11-21 DIAGNOSIS — J189 Pneumonia, unspecified organism: Secondary | ICD-10-CM | POA: Diagnosis not present

## 2019-12-21 DIAGNOSIS — I6782 Cerebral ischemia: Secondary | ICD-10-CM | POA: Diagnosis not present

## 2019-12-21 DIAGNOSIS — I7090 Unspecified atherosclerosis: Secondary | ICD-10-CM | POA: Diagnosis not present

## 2019-12-21 DIAGNOSIS — R930 Abnormal findings on diagnostic imaging of skull and head, not elsewhere classified: Secondary | ICD-10-CM | POA: Diagnosis not present

## 2019-12-21 DIAGNOSIS — J3489 Other specified disorders of nose and nasal sinuses: Secondary | ICD-10-CM | POA: Diagnosis not present

## 2020-02-18 DIAGNOSIS — E039 Hypothyroidism, unspecified: Secondary | ICD-10-CM | POA: Diagnosis not present

## 2020-02-18 DIAGNOSIS — E1169 Type 2 diabetes mellitus with other specified complication: Secondary | ICD-10-CM | POA: Diagnosis not present

## 2020-02-25 DIAGNOSIS — I152 Hypertension secondary to endocrine disorders: Secondary | ICD-10-CM | POA: Diagnosis not present

## 2020-02-25 DIAGNOSIS — J449 Chronic obstructive pulmonary disease, unspecified: Secondary | ICD-10-CM | POA: Diagnosis not present

## 2020-02-25 DIAGNOSIS — R059 Cough, unspecified: Secondary | ICD-10-CM | POA: Diagnosis not present

## 2020-02-25 DIAGNOSIS — K449 Diaphragmatic hernia without obstruction or gangrene: Secondary | ICD-10-CM | POA: Diagnosis not present

## 2020-02-25 DIAGNOSIS — R0902 Hypoxemia: Secondary | ICD-10-CM | POA: Diagnosis not present

## 2020-02-25 DIAGNOSIS — E1159 Type 2 diabetes mellitus with other circulatory complications: Secondary | ICD-10-CM | POA: Diagnosis not present

## 2020-02-25 DIAGNOSIS — Z20822 Contact with and (suspected) exposure to covid-19: Secondary | ICD-10-CM | POA: Diagnosis not present

## 2020-02-28 DIAGNOSIS — D649 Anemia, unspecified: Secondary | ICD-10-CM | POA: Diagnosis not present

## 2020-03-03 DIAGNOSIS — D509 Iron deficiency anemia, unspecified: Secondary | ICD-10-CM | POA: Diagnosis not present

## 2020-03-03 DIAGNOSIS — J441 Chronic obstructive pulmonary disease with (acute) exacerbation: Secondary | ICD-10-CM | POA: Diagnosis not present

## 2020-03-19 DIAGNOSIS — Z23 Encounter for immunization: Secondary | ICD-10-CM | POA: Diagnosis not present

## 2020-03-19 DIAGNOSIS — D649 Anemia, unspecified: Secondary | ICD-10-CM | POA: Diagnosis not present

## 2020-07-10 DIAGNOSIS — D500 Iron deficiency anemia secondary to blood loss (chronic): Secondary | ICD-10-CM | POA: Diagnosis not present

## 2020-07-10 DIAGNOSIS — K580 Irritable bowel syndrome with diarrhea: Secondary | ICD-10-CM | POA: Diagnosis not present

## 2020-07-10 DIAGNOSIS — Z1212 Encounter for screening for malignant neoplasm of rectum: Secondary | ICD-10-CM | POA: Diagnosis not present

## 2020-07-14 DIAGNOSIS — D500 Iron deficiency anemia secondary to blood loss (chronic): Secondary | ICD-10-CM | POA: Diagnosis not present

## 2020-08-16 DIAGNOSIS — E039 Hypothyroidism, unspecified: Secondary | ICD-10-CM | POA: Diagnosis not present

## 2020-08-16 DIAGNOSIS — G301 Alzheimer's disease with late onset: Secondary | ICD-10-CM | POA: Diagnosis not present

## 2020-08-16 DIAGNOSIS — J431 Panlobular emphysema: Secondary | ICD-10-CM | POA: Diagnosis not present

## 2020-08-16 DIAGNOSIS — E119 Type 2 diabetes mellitus without complications: Secondary | ICD-10-CM | POA: Diagnosis not present

## 2020-08-16 DIAGNOSIS — Z9181 History of falling: Secondary | ICD-10-CM | POA: Diagnosis not present

## 2020-08-16 DIAGNOSIS — R0902 Hypoxemia: Secondary | ICD-10-CM | POA: Diagnosis not present

## 2020-08-16 DIAGNOSIS — R404 Transient alteration of awareness: Secondary | ICD-10-CM | POA: Diagnosis not present

## 2020-08-16 DIAGNOSIS — R400 Somnolence: Secondary | ICD-10-CM | POA: Diagnosis not present

## 2020-08-16 DIAGNOSIS — G9341 Metabolic encephalopathy: Secondary | ICD-10-CM | POA: Diagnosis not present

## 2020-08-16 DIAGNOSIS — B9689 Other specified bacterial agents as the cause of diseases classified elsewhere: Secondary | ICD-10-CM | POA: Diagnosis not present

## 2020-08-16 DIAGNOSIS — I10 Essential (primary) hypertension: Secondary | ICD-10-CM | POA: Diagnosis not present

## 2020-08-16 DIAGNOSIS — Z79899 Other long term (current) drug therapy: Secondary | ICD-10-CM | POA: Diagnosis not present

## 2020-08-16 DIAGNOSIS — Z7984 Long term (current) use of oral hypoglycemic drugs: Secondary | ICD-10-CM | POA: Diagnosis not present

## 2020-08-16 DIAGNOSIS — Z993 Dependence on wheelchair: Secondary | ICD-10-CM | POA: Diagnosis not present

## 2020-08-16 DIAGNOSIS — J9811 Atelectasis: Secondary | ICD-10-CM | POA: Diagnosis not present

## 2020-08-16 DIAGNOSIS — K449 Diaphragmatic hernia without obstruction or gangrene: Secondary | ICD-10-CM | POA: Diagnosis not present

## 2020-08-16 DIAGNOSIS — N3000 Acute cystitis without hematuria: Secondary | ICD-10-CM | POA: Diagnosis not present

## 2020-08-16 IMAGING — DX DG CHEST 1V
1 series · 1 of 1 positions shown · non-contrast
Comparison: Chest radiograph 03/07/2019

CLINICAL DATA: Wheezing/ shob

EXAM:
CHEST  1 VIEW

[chest]
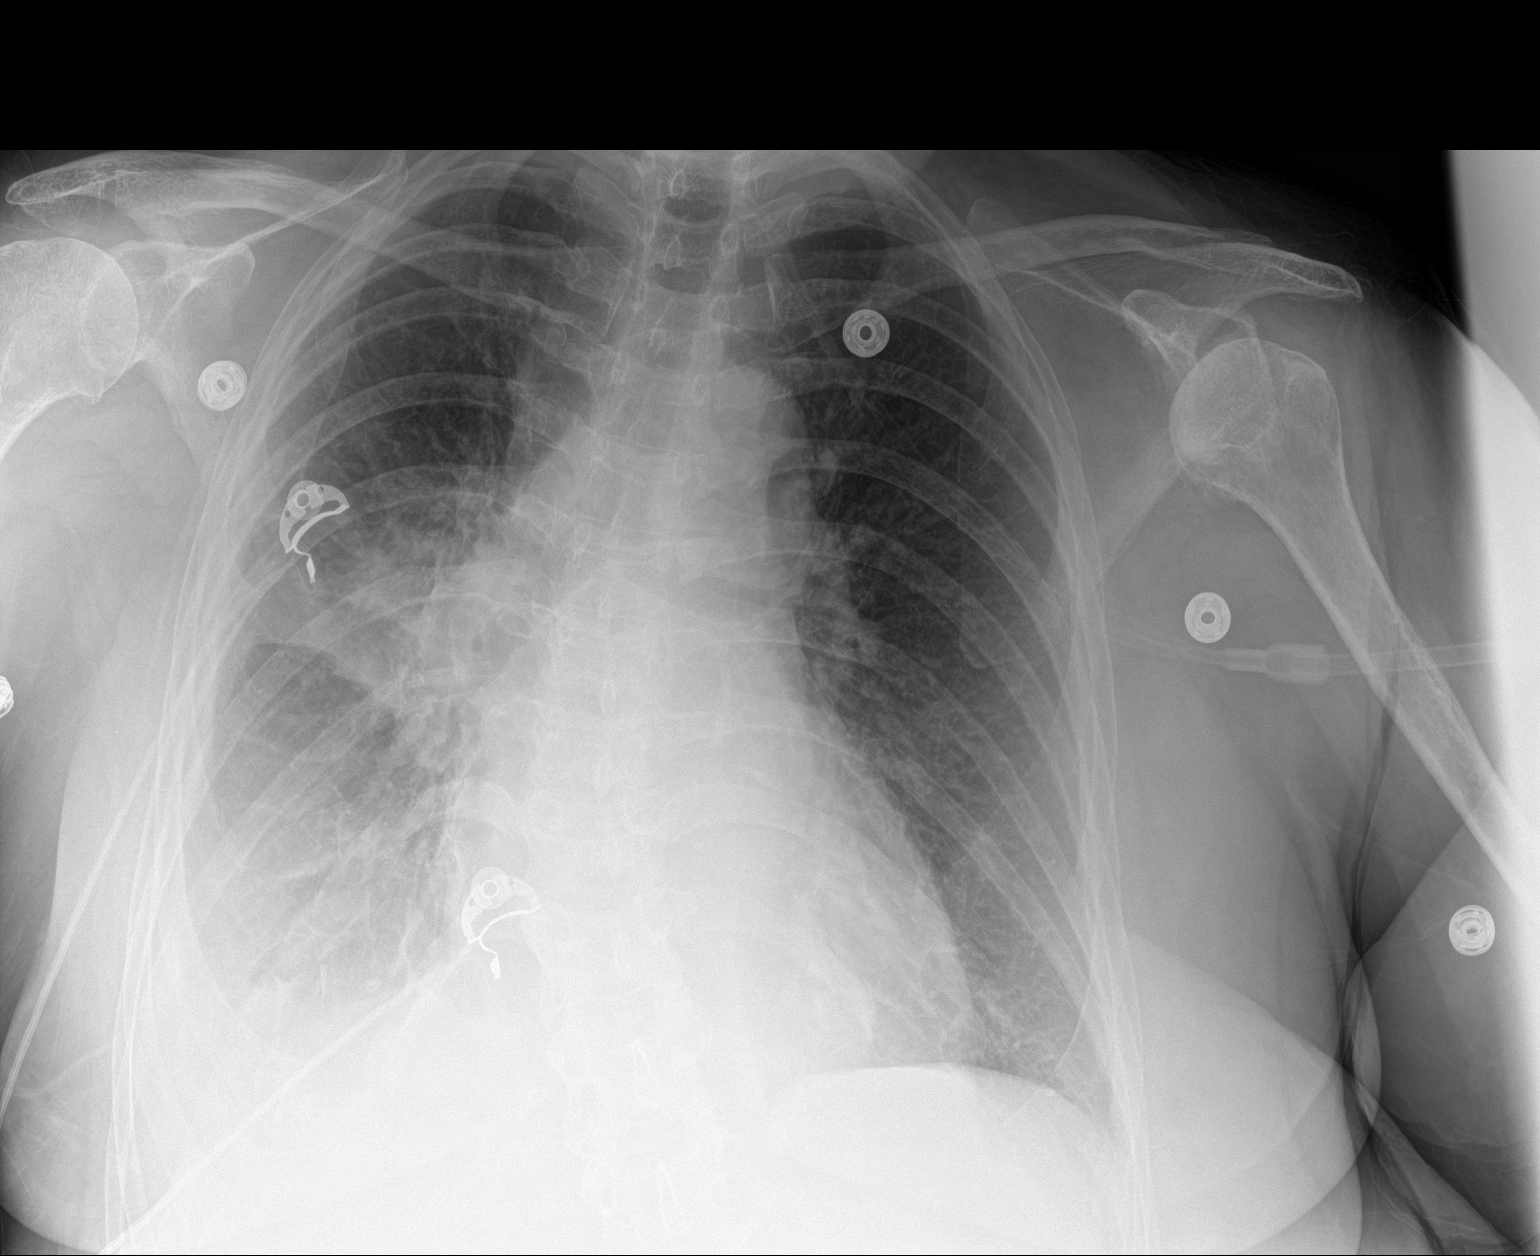

[1 of 1 positions shown; findings below may reference images not displayed]

FINDINGS: Stable cardiomediastinal contours. There is improved aeration in the
right mid to lower lung and left lung base, likely resolving
infection. No pneumothorax. Possible small volume right pleural
fluid. No acute finding in the visualized skeleton.
IMPRESSION: Improving aeration in the right mid to lower lung and left lung
base, likely resolving infection. No new findings.

## 2020-08-17 DIAGNOSIS — G9341 Metabolic encephalopathy: Secondary | ICD-10-CM | POA: Diagnosis not present

## 2020-08-17 DIAGNOSIS — I10 Essential (primary) hypertension: Secondary | ICD-10-CM | POA: Diagnosis not present

## 2020-08-17 DIAGNOSIS — J431 Panlobular emphysema: Secondary | ICD-10-CM | POA: Diagnosis not present

## 2020-08-21 DIAGNOSIS — K580 Irritable bowel syndrome with diarrhea: Secondary | ICD-10-CM | POA: Diagnosis not present

## 2020-08-21 DIAGNOSIS — D500 Iron deficiency anemia secondary to blood loss (chronic): Secondary | ICD-10-CM | POA: Diagnosis not present

## 2020-08-21 DIAGNOSIS — R195 Other fecal abnormalities: Secondary | ICD-10-CM | POA: Diagnosis not present

## 2020-08-25 DIAGNOSIS — Z79899 Other long term (current) drug therapy: Secondary | ICD-10-CM | POA: Diagnosis not present

## 2020-08-25 DIAGNOSIS — E611 Iron deficiency: Secondary | ICD-10-CM | POA: Diagnosis not present

## 2020-09-05 DIAGNOSIS — J441 Chronic obstructive pulmonary disease with (acute) exacerbation: Secondary | ICD-10-CM | POA: Diagnosis not present

## 2020-09-05 DIAGNOSIS — E1140 Type 2 diabetes mellitus with diabetic neuropathy, unspecified: Secondary | ICD-10-CM | POA: Diagnosis not present

## 2020-09-05 DIAGNOSIS — E1169 Type 2 diabetes mellitus with other specified complication: Secondary | ICD-10-CM | POA: Diagnosis not present

## 2020-09-05 DIAGNOSIS — E039 Hypothyroidism, unspecified: Secondary | ICD-10-CM | POA: Diagnosis not present

## 2020-09-05 DIAGNOSIS — E785 Hyperlipidemia, unspecified: Secondary | ICD-10-CM | POA: Diagnosis not present

## 2020-09-08 DIAGNOSIS — R059 Cough, unspecified: Secondary | ICD-10-CM | POA: Diagnosis not present

## 2020-09-08 DIAGNOSIS — J441 Chronic obstructive pulmonary disease with (acute) exacerbation: Secondary | ICD-10-CM | POA: Diagnosis not present

## 2020-10-14 IMAGING — DX DG CHEST 1V PORT
1 series · 1 of 1 positions shown · non-contrast
Comparison: 03/11/2019

CLINICAL DATA: Shortness of breath and cough.

EXAM:
PORTABLE CHEST 1 VIEW

[chest ap]
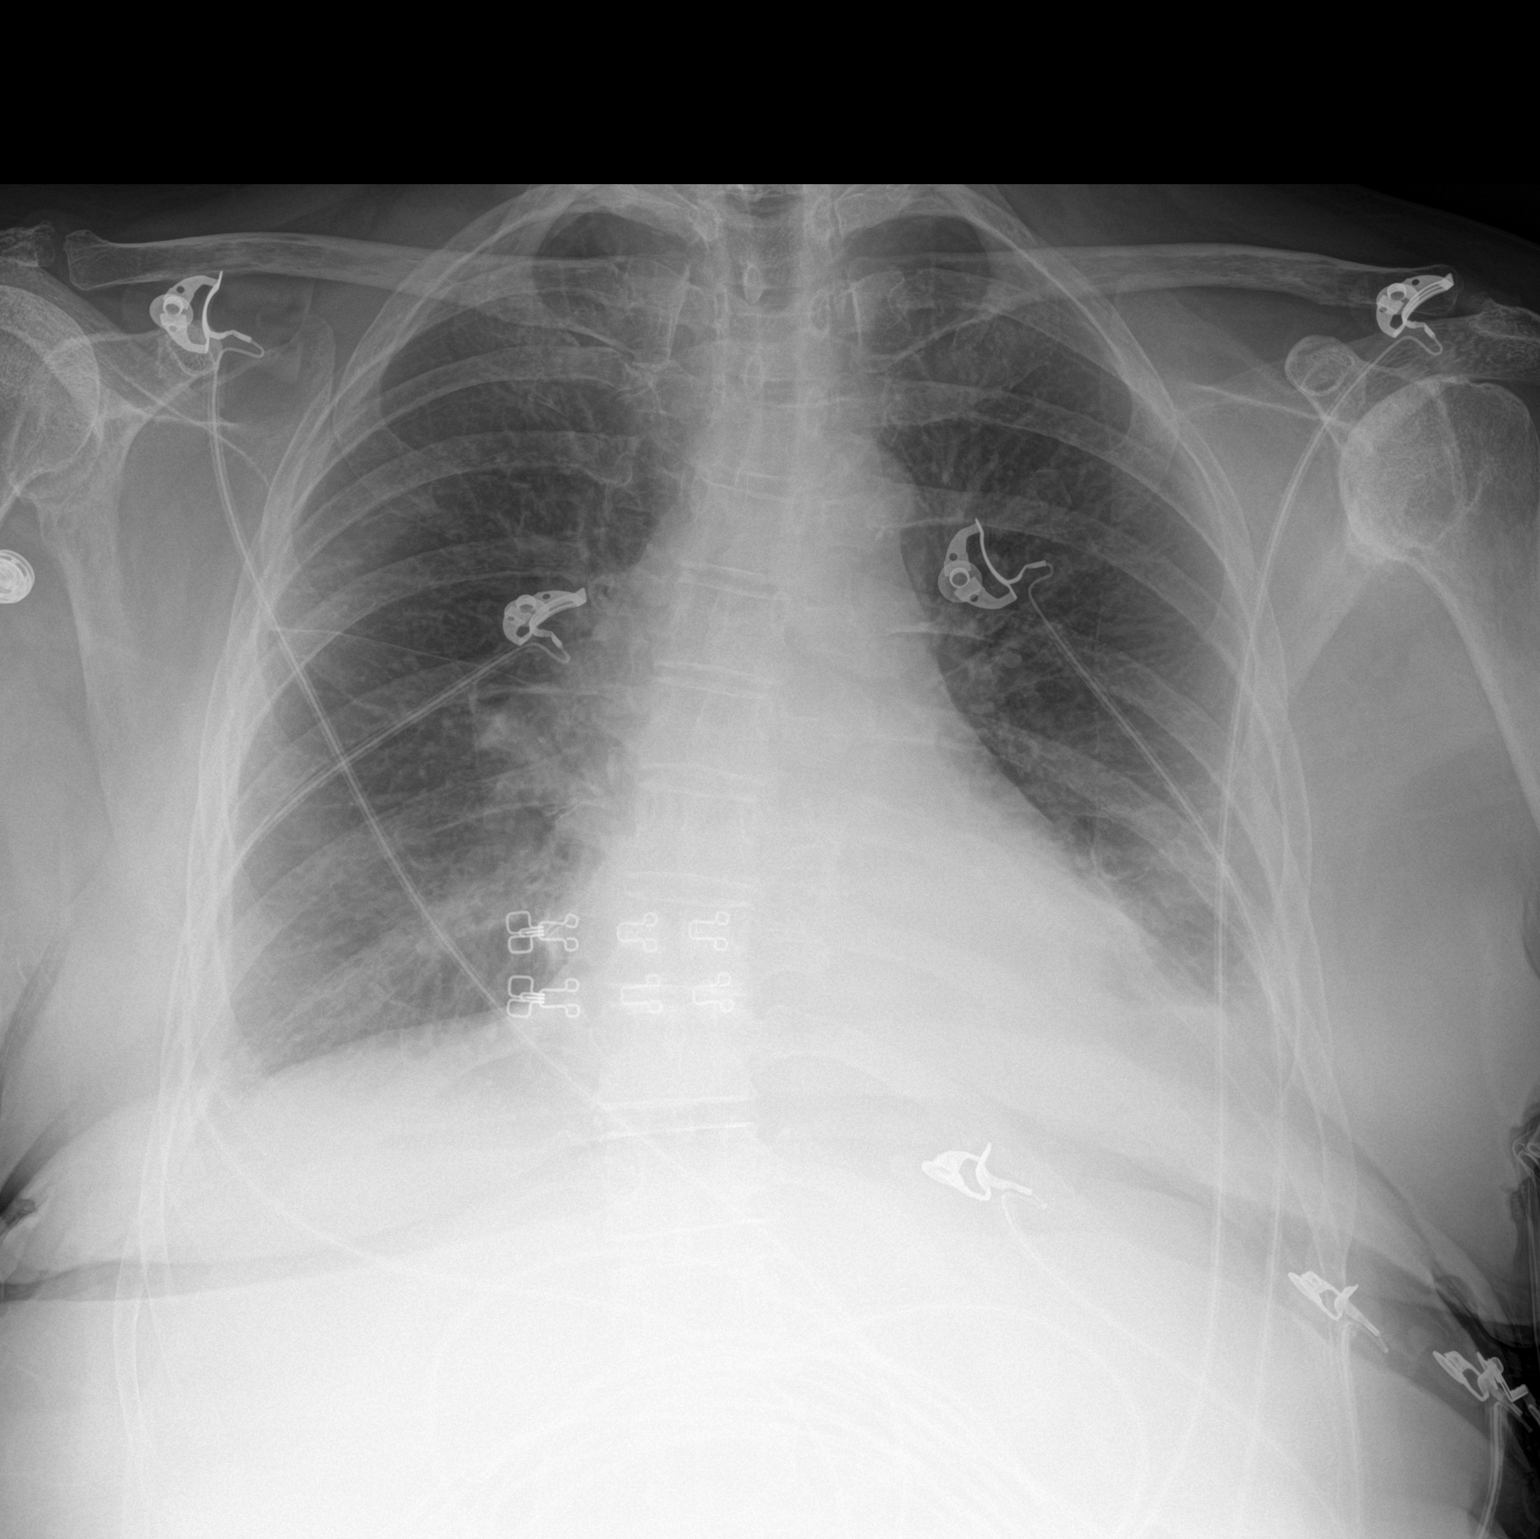

[1 of 1 positions shown; findings below may reference images not displayed]

FINDINGS: The cardiomediastinal silhouette is unchanged. Lung volumes are
lower than on the prior study. There is improved right lung aeration
with resolution of right midlung opacities and only minimal opacity
currently present in the right lung base. There is mild left basilar
opacity which is greater than on the prior study, and there are
likely small left and possibly small residual right pleural
effusions. No pneumothorax is identified. There is mild
thoracolumbar dextroscoliosis.
IMPRESSION: 1. Improved right lung aeration with minimal right basilar opacity
currently, likely atelectasis.
2. Increased left basilar opacity which may represent atelectasis or
infection.
3. Suspected small pleural effusions.

## 2020-10-16 IMAGING — DX DG CHEST 1V PORT
1 series · 1 of 1 positions shown · non-contrast
Comparison: Radiographs dated 05/09/2019 and 03/11/2019

CLINICAL DATA: Wheezing. History of COPD.

EXAM:
PORTABLE CHEST 1 VIEW

[chest ap]
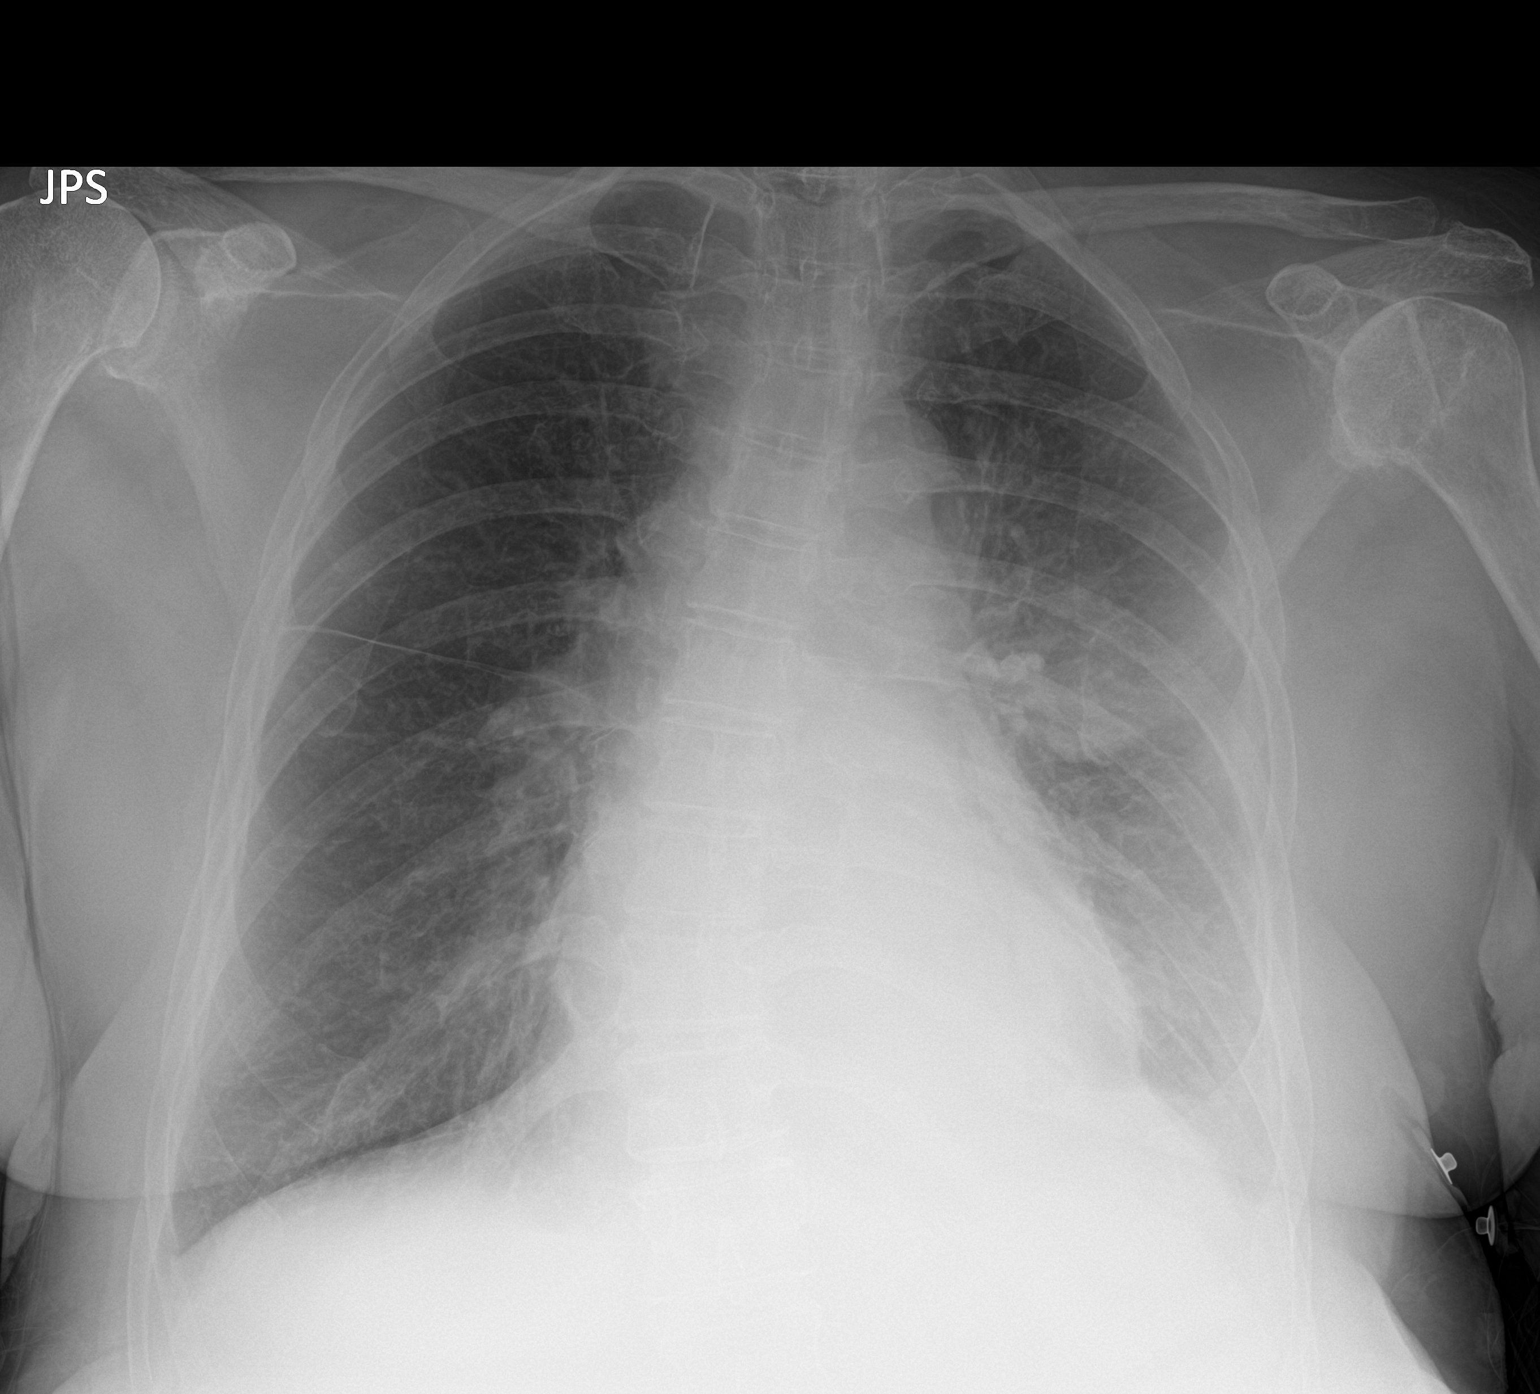

[1 of 1 positions shown; findings below may reference images not displayed]

FINDINGS: There is persistent consolidation at the left lung base with air
bronchograms in the lower lobes. There is a new area of hazy
infiltrate in the left midzone.

Heart size and vascularity are normal. Right lung is clear. No acute
bone abnormality. Mild thoracolumbar scoliosis.
IMPRESSION: 1. Persistent consolidation at the left lung base with air
bronchograms in the right midzone.
2. New area of hazy infiltrate in the left midzone.

## 2020-11-24 ENCOUNTER — Emergency Department (HOSPITAL_COMMUNITY)
Admission: EM | Admit: 2020-11-24 | Discharge: 2020-11-26 | Disposition: A | Discharge status: Home / Self Care | Payer: Medicare Other | Attending: Emergency Medicine | Admitting: Emergency Medicine

## 2020-11-24 ENCOUNTER — Other Ambulatory Visit: Payer: Self-pay

## 2020-11-24 ENCOUNTER — Encounter (HOSPITAL_COMMUNITY): Payer: Self-pay

## 2020-11-24 DIAGNOSIS — M4726 Other spondylosis with radiculopathy, lumbar region: Secondary | ICD-10-CM | POA: Diagnosis not present

## 2020-11-24 DIAGNOSIS — E119 Type 2 diabetes mellitus without complications: Secondary | ICD-10-CM | POA: Diagnosis not present

## 2020-11-24 DIAGNOSIS — Z87891 Personal history of nicotine dependence: Secondary | ICD-10-CM | POA: Insufficient documentation

## 2020-11-24 DIAGNOSIS — M79605 Pain in left leg: Secondary | ICD-10-CM

## 2020-11-24 DIAGNOSIS — I10 Essential (primary) hypertension: Secondary | ICD-10-CM | POA: Diagnosis not present

## 2020-11-24 DIAGNOSIS — M48061 Spinal stenosis, lumbar region without neurogenic claudication: Secondary | ICD-10-CM

## 2020-11-24 DIAGNOSIS — Z79899 Other long term (current) drug therapy: Secondary | ICD-10-CM | POA: Diagnosis not present

## 2020-11-24 DIAGNOSIS — E039 Hypothyroidism, unspecified: Secondary | ICD-10-CM | POA: Insufficient documentation

## 2020-11-24 DIAGNOSIS — M79604 Pain in right leg: Secondary | ICD-10-CM | POA: Insufficient documentation

## 2020-11-24 DIAGNOSIS — J441 Chronic obstructive pulmonary disease with (acute) exacerbation: Secondary | ICD-10-CM | POA: Diagnosis not present

## 2020-11-24 DIAGNOSIS — R0902 Hypoxemia: Secondary | ICD-10-CM | POA: Diagnosis not present

## 2020-11-24 DIAGNOSIS — M1991 Primary osteoarthritis, unspecified site: Secondary | ICD-10-CM | POA: Diagnosis not present

## 2020-11-24 DIAGNOSIS — M545 Low back pain, unspecified: Secondary | ICD-10-CM | POA: Diagnosis not present

## 2020-11-24 DIAGNOSIS — Z7984 Long term (current) use of oral hypoglycemic drugs: Secondary | ICD-10-CM | POA: Insufficient documentation

## 2020-11-24 DIAGNOSIS — I959 Hypotension, unspecified: Secondary | ICD-10-CM | POA: Diagnosis not present

## 2020-11-24 DIAGNOSIS — M5416 Radiculopathy, lumbar region: Secondary | ICD-10-CM | POA: Diagnosis not present

## 2020-11-24 LAB — CBC WITH DIFFERENTIAL/PLATELET
Abs Immature Granulocytes: 0.02 10*3/uL (ref 0.00–0.07)
Basophils Absolute: 0 10*3/uL (ref 0.0–0.1)
Basophils Relative: 1 %
Eosinophils Absolute: 0.1 10*3/uL (ref 0.0–0.5)
Eosinophils Relative: 2 %
HCT: 35.5 % — ABNORMAL LOW (ref 36.0–46.0)
Hemoglobin: 10.7 g/dL — ABNORMAL LOW (ref 12.0–15.0)
Immature Granulocytes: 0 %
Lymphocytes Relative: 15 %
Lymphs Abs: 0.9 10*3/uL (ref 0.7–4.0)
MCH: 28.4 pg (ref 26.0–34.0)
MCHC: 30.1 g/dL (ref 30.0–36.0)
MCV: 94.2 fL (ref 80.0–100.0)
Monocytes Absolute: 0.4 10*3/uL (ref 0.1–1.0)
Monocytes Relative: 7 %
Neutro Abs: 4.6 10*3/uL (ref 1.7–7.7)
Neutrophils Relative %: 75 %
Platelets: 254 10*3/uL (ref 150–400)
RBC: 3.77 MIL/uL — ABNORMAL LOW (ref 3.87–5.11)
RDW: 18.3 % — ABNORMAL HIGH (ref 11.5–15.5)
WBC: 6.2 10*3/uL (ref 4.0–10.5)
nRBC: 0 % (ref 0.0–0.2)

## 2020-11-24 LAB — COMPREHENSIVE METABOLIC PANEL
ALT: 12 U/L (ref 0–44)
AST: 19 U/L (ref 15–41)
Albumin: 3.3 g/dL — ABNORMAL LOW (ref 3.5–5.0)
Alkaline Phosphatase: 87 U/L (ref 38–126)
Anion gap: 9 (ref 5–15)
BUN: 13 mg/dL (ref 8–23)
CO2: 31 mmol/L (ref 22–32)
Calcium: 9.3 mg/dL (ref 8.9–10.3)
Chloride: 103 mmol/L (ref 98–111)
Creatinine, Ser: 0.94 mg/dL (ref 0.44–1.00)
GFR, Estimated: >60 mL/min (ref >60)
Glucose, Bld: 188 mg/dL — ABNORMAL HIGH (ref 70–99)
Potassium: 3.9 mmol/L (ref 3.5–5.1)
Sodium: 143 mmol/L (ref 135–145)
Total Bilirubin: 0.4 mg/dL (ref 0.3–1.2)
Total Protein: 6.4 g/dL — ABNORMAL LOW (ref 6.5–8.1)

## 2020-11-24 NOTE — ED Provider Notes (Signed)
Emergency Medicine Provider Triage Evaluation Note  Karla Clark , a 75 y.o. female  was evaluated in triage.  Pt complains of bilateral leg pain for the past week.  Patient does have history of schizophrenia and is extremely hard of hearing, hard to obtain history from.  Patient is bedbound and does not ambulate.  Review of Systems  Positive: Bilateral leg pain Negative: Fevers, chest pain, abdominal pain, nausea, vomiting  Physical Exam  BP 122/71 (BP Location: Left Arm)   Pulse 74   Temp 98.7 F (37.1 C) (Oral)   Resp 16   SpO2 99%  Gen:   Awake, no distress   Resp:  Normal effort MSK:   Moves extremities without difficult, lower extremities with some edema.  Since patient is bedbound would like to check for sacral decubitus, however unable to do in triage. Other:   Medical Decision Making  Medically screening exam initiated at 2:02 PM.  Appropriate orders placed.  Karla Clark was informed that the remainder of the evaluation will be completed by another provider, this initial triage assessment does not replace that evaluation, and the importance of remaining in the ED until their evaluation is complete.     Farrel Gordon, PA-C 11/24/20 1404    Mancel Bale, MD 11/25/20 1739

## 2020-11-24 NOTE — ED Triage Notes (Signed)
Patient arrived by [facility] for bilateral leg pain x 1 week. Patient states that she is bed bound and doesn't ambulate and denies trauma. Alert to baseline. NAD

## 2020-11-25 ENCOUNTER — Emergency Department (HOSPITAL_BASED_OUTPATIENT_CLINIC_OR_DEPARTMENT_OTHER): Payer: Medicare Other

## 2020-11-25 ENCOUNTER — Emergency Department (HOSPITAL_COMMUNITY): Payer: Medicare Other

## 2020-11-25 DIAGNOSIS — R609 Edema, unspecified: Secondary | ICD-10-CM | POA: Diagnosis not present

## 2020-11-25 DIAGNOSIS — M545 Low back pain, unspecified: Secondary | ICD-10-CM | POA: Diagnosis not present

## 2020-11-25 LAB — CBG MONITORING, ED: Glucose-Capillary: 78 mg/dL (ref 70–99)

## 2020-11-25 MED ORDER — OXYCODONE HCL 5 MG PO TABS
2.5000 mg | ORAL_TABLET | Freq: Once | ORAL | Status: AC
  Administered 2020-11-25: 2.5 mg via ORAL
  Filled 2020-11-25: qty 1

## 2020-11-25 MED ORDER — DIAZEPAM 5 MG PO TABS
5.0000 mg | ORAL_TABLET | Freq: Once | ORAL | Status: AC
  Administered 2020-11-25: 5 mg via ORAL
  Filled 2020-11-25: qty 1

## 2020-11-25 MED ORDER — OXYCODONE-ACETAMINOPHEN 5-325 MG PO TABS
1.0000 | ORAL_TABLET | Freq: Once | ORAL | Status: AC
  Administered 2020-11-25: 1 via ORAL
  Filled 2020-11-25: qty 1

## 2020-11-25 MED ORDER — PREDNISONE 10 MG PO TABS: ORAL_TABLET | ORAL | 0 refills | Status: AC

## 2020-11-25 MED ORDER — DEXAMETHASONE SODIUM PHOSPHATE 10 MG/ML IJ SOLN
10.0000 mg | Freq: Once | INTRAMUSCULAR | Status: AC
  Administered 2020-11-25: 10 mg via INTRAMUSCULAR
  Filled 2020-11-25: qty 1

## 2020-11-25 MED ORDER — HYDROCODONE-ACETAMINOPHEN 5-325 MG PO TABS: 1.0000 | ORAL_TABLET | ORAL | 0 refills | Status: AC | PRN

## 2020-11-25 MED ORDER — ACETAMINOPHEN 325 MG PO TABS
650.0000 mg | ORAL_TABLET | Freq: Once | ORAL | Status: AC
  Administered 2020-11-25: 650 mg via ORAL
  Filled 2020-11-25: qty 2

## 2020-11-25 MED ORDER — DIAZEPAM 5 MG PO TABS: 5.0000 mg | ORAL_TABLET | Freq: Four times a day (QID) | ORAL | 0 refills | Status: AC | PRN

## 2020-11-25 NOTE — ED Notes (Signed)
Moved pt off the floor no answer 3x

## 2020-11-25 NOTE — Progress Notes (Signed)
Bilateral lower extremity venous duplex completed. Refer to "CV Proc" under chart review to view preliminary results.  11/25/2020 4:05 PM Eula Fried., MHA, RVT, RDCS, RDMS

## 2020-11-25 NOTE — ED Notes (Signed)
Patient transported to vascular. 

## 2020-11-25 NOTE — Discharge Instructions (Addendum)
Please read and follow all provided instructions.  Your diagnoses today include:  1. Pain in both lower extremities   2. Osteoarthritis of spine with radiculopathy, lumbar region   3. Spinal stenosis of lumbar region, unspecified whether neurogenic claudication present    Tests performed today include: Blood cell counts (white, red, and platelets) Electrolytes  Kidney function test Ultrasound - does not show any signs of blood clots Vital signs. See below for your results today.   Medications prescribed:  None  Take any prescribed medications only as directed.  Home care instructions:  Follow any educational materials contained in this packet.  BE VERY CAREFUL not to take multiple medicines containing Tylenol (also called acetaminophen). Doing so can lead to an overdose which can damage your liver and cause liver failure and possibly death.   Follow-up instructions: Please follow-up with your primary care provider in the next 3 days for further evaluation of your symptoms.   Return instructions:  Please return to the Emergency Department if you experience worsening symptoms.  Please return if you have any other emergent concerns.  Additional Information:  Your vital signs today were: BP 139/60   Pulse 78   Temp 99.1 F (37.3 C) (Oral)   Resp 14   SpO2 96%  If your blood pressure (BP) was elevated above 135/85 this visit, please have this repeated by your doctor within one month. --------------

## 2020-11-25 NOTE — ED Notes (Signed)
Call to daughter to notify of discharge, stated never received call with results/plan for discharge.  Provider made aware and will speak to daughter about discharge plan

## 2020-11-25 NOTE — ED Notes (Signed)
Pt able to consume 1 cup apple sauce and orange juice.   Pt refuses to ambulate for this RN.

## 2020-11-25 NOTE — ED Notes (Signed)
Came back to room, pt still refusing to ambulate or even sit on side of the stretcher. Pt states "I cant, I cant do that, im hurting on my back".

## 2020-11-25 NOTE — ED Notes (Signed)
Pt is up for discharge, called her daughter Sheliah Mends and is able to pick pt up.

## 2020-11-25 NOTE — ED Provider Notes (Addendum)
[facility] EMERGENCY DEPARTMENT Provider Note   CSN: [id number] Arrival date & time: 11/24/20  1342     History No chief complaint on file.   Karla Clark is a 75 y.o. female.  Patient with history of diabetes, schizophrenia, reportedly nonambulatory --presents for evaluation of leg pain.  Level 5 caveat due to psychiatric illness.  Per triage note from yesterday, patient had leg pain.  Currently she states that she feels pretty good except her lower back hurts.  No reported fevers, abdominal pain, vomiting, diarrhea.  No obvious lower extremity edema.  Per daughter, patient can transfer and walk short distances at baseline, but over the past week has had difficulty walking due to back and leg pain. H/o rheumatoid arthritis. Tylenol not working at home.       Past Medical History:  Diagnosis Date   Anxiety    COPD (chronic obstructive pulmonary disease) (HCC)    Diabetes mellitus without complication (HCC)    Elevated cholesterol    Hypertension    Hypothyroidism    Osteoarthritis    RA (rheumatoid arthritis) (HCC)    Schizophrenia (HCC)    delusional    Patient Active Problem List   Diagnosis Date Noted   Acute respiratory failure with hypoxia (HCC) 05/10/2019   Sepsis due to pneumonia (HCC) 03/07/2019   Acute lower UTI 03/07/2019   Acute on chronic respiratory failure with hypoxia (HCC) 03/07/2019   COPD with acute exacerbation (HCC) 03/07/2019   Acute metabolic encephalopathy 03/07/2019   Transient hypotension 03/07/2019   AKI (acute kidney injury) (HCC) 03/07/2019   Rheumatoid arthritis with rheumatoid factor of multiple sites without organ or systems involvement (HCC) 07/19/2016   High risk medications (not anticoagulants) long-term use 07/19/2016   Osteoarthritis of both knees 07/19/2016   Osteoarthritis of both hands 07/19/2016   Primary osteoarthritis of both feet 07/19/2016    Past Surgical History:  Procedure Laterality Date    ABDOMINAL HYSTERECTOMY       OB History   No obstetric history on file.     No family history on file.  Social History   Tobacco Use   Smoking status: Former    Types: Cigarettes    Quit date: 03/11/1986    Years since quitting: 34.7   Smokeless tobacco: Never  Substance Use Topics   Alcohol use: No   Drug use: No    Home Medications Prior to Admission medications   Medication Sig Start Date End Date Taking? Authorizing Provider  albuterol (PROVENTIL) (2.5 MG/3ML) 0.083% nebulizer solution Take 2.5 mg by nebulization every 6 (six) hours as needed for wheezing or shortness of breath.    [provider]  ferrous sulfate 325 (65 FE) MG tablet Take 1 tablet (325 mg total) by mouth daily. 03/12/19 05/09/19  Burnadette Pop, MD  Fluticasone-Umeclidin-Vilant (TRELEGY ELLIPTA) 100-62.5-25 MCG/INH AEPB Inhale 1 puff into the lungs daily. 12/05/17   [provider]  folic acid (FOLVITE) 1 MG tablet TAKE 1 TABLET BY MOUTH EVERY DAY Patient taking differently: Take 1 mg by mouth daily.  03/30/17   Pollyann Savoy, MD  furosemide (LASIX) 40 MG tablet Take 1 tablet (40 mg total) by mouth daily. 03/12/19 03/11/20  Burnadette Pop, MD  gabapentin (NEURONTIN) 600 MG tablet Take 1,200 mg by mouth 2 (two) times daily. 03/02/19   [provider]  ipratropium-albuterol (DUONEB) 0.5-2.5 (3) MG/3ML SOLN Take 3 mLs by nebulization every 6 (six) hours as needed (shortness of breath).  [provider]  levothyroxine (SYNTHROID) 75 MCG tablet Take 75 mcg by mouth daily. 01/21/19   [provider]  losartan (COZAAR) 100 MG tablet Take 100 mg by mouth daily. 02/16/19   [provider]  metFORMIN (GLUCOPHAGE-XR) 500 MG 24 hr tablet Take 500 mg by mouth 2 (two) times daily.  07/05/16   [provider]  NYSTATIN powder Apply 1 g topically 2 (two) times daily as needed (rash).  02/05/16   [provider]  pantoprazole (PROTONIX) 40 MG tablet Take  40 mg by mouth daily. 12/28/18   [provider]  pioglitazone (ACTOS) 30 MG tablet Take 30 mg by mouth daily.  01/27/16   [provider]  potassium chloride 20 MEQ TBCR Take 20 mEq by mouth daily. 03/12/19   Burnadette Pop, MD  simvastatin (ZOCOR) 40 MG tablet Take 40 mg by mouth daily at 6 PM.  01/12/16   [provider]  venlafaxine XR (EFFEXOR-XR) 150 MG 24 hr capsule Take 150 mg by mouth every morning. 02/15/19   [provider]  ziprasidone (GEODON) 40 MG capsule Take 40 mg by mouth at bedtime.  02/26/19   [provider]    Allergies    Ace inhibitors  Review of Systems   Review of Systems  Unable to perform ROS: Psychiatric disorder   Physical Exam Updated Vital Signs BP (!) 152/99 (BP Location: Right Arm)   Pulse 82   Temp 99.1 F (37.3 C) (Oral)   Resp 16   SpO2 97%   Physical Exam Vitals and nursing note reviewed.  Constitutional:      General: She is not in acute distress.    Appearance: She is well-developed.  HENT:     Head: Normocephalic and atraumatic.     Right Ear: External ear normal.     Left Ear: External ear normal.     Nose: Nose normal.  Eyes:     Conjunctiva/sclera: Conjunctivae normal.  Cardiovascular:     Rate and Rhythm: Normal rate and regular rhythm.     Heart sounds: No murmur heard. Pulmonary:     Effort: No respiratory distress.     Breath sounds: No wheezing, rhonchi or rales.  Abdominal:     Palpations: Abdomen is soft.     Tenderness: There is no abdominal tenderness. There is no guarding or rebound.  Musculoskeletal:     Cervical back: Normal range of motion and neck supple.     Right lower leg: No edema.     Left lower leg: No edema.     Comments: Patient does not complain of pain when I push on her lower back.  No significant lower extremity swelling bilaterally.  Skin:    General: Skin is warm and dry.     Findings: No rash.  Neurological:     General: No focal deficit present.      Mental Status: She is alert. Mental status is at baseline.     Motor: No weakness.  Psychiatric:        Mood and Affect: Mood normal.    ED Results / Procedures / Treatments   Labs (all labs ordered are listed, but only abnormal results are displayed) Labs Reviewed  COMPREHENSIVE METABOLIC PANEL - Abnormal; Notable for the following components:      Result Value   Glucose, Bld 188 (*)    Total Protein 6.4 (*)    Albumin 3.3 (*)    All other components within normal limits  CBC WITH DIFFERENTIAL/PLATELET - Abnormal; Notable for the following components:   RBC 3.77 (*)    Hemoglobin 10.7 (*)    HCT 35.5 (*)    RDW 18.3 (*)    All other components within normal limits    EKG None  Radiology VAS [place] LOWER EXTREMITY VENOUS (DVT) (ONLY [facility] & [facility])  Result Date: 11/25/2020  Lower Venous DVT Study Patient Name:  Karla Clark [facility]  Date of Exam:   11/25/2020 Medical Rec #: [id number]          Accession #:    [id number] Date of Birth: 1945-08-31         Patient Gender: F Patient Age:   11Y Exam Location:  [facility] Procedure:      VAS [place] LOWER EXTREMITY VENOUS (DVT) Referring Phys: [id number] [place] [place] --------------------------------------------------------------------------------  Indications: Edema.  Limitations: Poor ultrasound/tissue interface. Comparison Study: No prior study Performing Technologist: Gertie Fey MHA, RDMS, RVT, RDCS  Examination Guidelines: A complete evaluation includes B-mode imaging, spectral Doppler, color Doppler, and power Doppler as needed of all accessible portions of each vessel. Bilateral testing is considered an integral part of a complete examination. Limited examinations for reoccurring indications may be performed as noted. The reflux portion of the exam is performed with the patient in reverse Trendelenburg.  +---------+---------------+---------+-----------+----------+--------------+ RIGHT     CompressibilityPhasicitySpontaneityPropertiesThrombus Aging +---------+---------------+---------+-----------+----------+--------------+ CFV      Full           Yes      Yes                                 +---------+---------------+---------+-----------+----------+--------------+ SFJ      Full                                                        +---------+---------------+---------+-----------+----------+--------------+ FV Prox  Full                                                        +---------+---------------+---------+-----------+----------+--------------+ FV Mid   Full                                                        +---------+---------------+---------+-----------+----------+--------------+ FV DistalFull                                                        +---------+---------------+---------+-----------+----------+--------------+ PFV      Full                                                        +---------+---------------+---------+-----------+----------+--------------+ POP  Full           Yes      Yes                                 +---------+---------------+---------+-----------+----------+--------------+ PTV      Full                                                        +---------+---------------+---------+-----------+----------+--------------+ PERO     Full                                                        +---------+---------------+---------+-----------+----------+--------------+   +---------+---------------+---------+-----------+----------+--------------+ LEFT     CompressibilityPhasicitySpontaneityPropertiesThrombus Aging +---------+---------------+---------+-----------+----------+--------------+ CFV      Full           Yes      Yes                                 +---------+---------------+---------+-----------+----------+--------------+ SFJ      Full                                                         +---------+---------------+---------+-----------+----------+--------------+ FV Prox  Full                                                        +---------+---------------+---------+-----------+----------+--------------+ FV Mid   Full                                                        +---------+---------------+---------+-----------+----------+--------------+ FV DistalFull                                                        +---------+---------------+---------+-----------+----------+--------------+ PFV      Full                                                        +---------+---------------+---------+-----------+----------+--------------+ POP      Full           Yes      Yes                                 +---------+---------------+---------+-----------+----------+--------------+  PTV      Full                                                        +---------+---------------+---------+-----------+----------+--------------+ PERO     Full                                                        +---------+---------------+---------+-----------+----------+--------------+     Summary: RIGHT: - There is no evidence of deep vein thrombosis in the lower extremity.  - No cystic structure found in the popliteal fossa.  LEFT: - There is no evidence of deep vein thrombosis in the lower extremity.  - No cystic structure found in the popliteal fossa.  *See table(s) above for measurements and observations. Electronically signed by Waverly Ferrari MD on 11/25/2020 at 5:00:17 PM.    Final     Procedures Procedures   Medications Ordered in ED Medications  acetaminophen (TYLENOL) tablet 650 mg (650 mg Oral Given 11/25/20 1455)    ED Course  I have reviewed the triage vital signs and the nursing notes.  Pertinent labs & imaging results that were available during my care of the patient were reviewed by me and considered in my medical decision making  (see chart for details).  Patient seen and examined.  Work-up, ordered yesterday, reviewed.  No concerning findings.  Unfortunately patient has had extended wait time.  We will recheck blood sugar.  As patient is bedbound, she is at high risk for DVT.  DVT study ordered.  Vital signs reviewed and are as follows: BP (!) 152/99 (BP Location: Right Arm)   Pulse 82   Temp 99.1 F (37.3 C) (Oral)   Resp 16   SpO2 97%   CBG normal.  Bilateral lower extremity venous Doppler negative for DVT.  Plan for discharged home with symptomatic care, PCP follow-up.  5:52 PM Spoke with daughter prior to d/c. She has concerns regarding change in ability to ambulate. Will obtain imaging of the lower back. MRI ordered.  Clinical Course as of 11/28/20 0959  Tue Nov 25, 2020  2128 I discussed case with Dr. Conchita Paris, who recommends medical treatment, and outpatient follow-up for possible injection if needed. [EW]  2214 MR LUMBAR SPINE WO CONTRAST [EW]    Clinical Course User Index [EW] Mancel Bale, MD   Pt care assumed by Dr. Effie Shy at shift change. Pending MRI results, ambulation.    MDM Rules/Calculators/A&P                           Patient here for bilateral leg pain.  She is a poor historian and is difficult to ascertain chief complaint.  Status post extended ED stay.  Ruled out lower extremity DVT.  Leg exam is reassuring with 2+ pedal pulses bilaterally.  She looks well.  Lab work without abnormality.   Final Clinical Impression(s) / ED Diagnoses Final diagnoses:  Pain in both lower extremities    Rx / DC Orders ED Discharge Orders          Ordered    HYDROcodone-acetaminophen (NORCO) 5-325 MG tablet  Every 4 hours  PRN        11/25/20 2224    predniSONE (DELTASONE) 10 MG tablet        11/25/20 2224    diazepam (VALIUM) 5 MG tablet  Every 6 hours PRN        11/25/20 2224              Renne Crigler, PA-C 11/28/20 1001    Mancel Bale, MD 11/28/20 1358

## 2020-11-25 NOTE — ED Notes (Signed)
Called Pt no answer.

## 2020-11-25 NOTE — ED Notes (Signed)
Patient transported to MRI 

## 2020-11-25 NOTE — ED Provider Notes (Signed)
   Face-to-face evaluation   History: She presents for atraumatic pain in the low back extending to the legs, which has been present for 1 week.  She has had trouble getting out of bed, due to the pain.  There is been no known injury or preceding symptoms according to the patient.  Patient denies other problems  Physical exam: Elderly female who is alert.  She is able to extend both legs off the stretcher independently.  She has marked swelling of the left greater than right knee, consistent with arthritis.  At 10:30 PM, the patient is alert and appears comfortable while lying supine in bed.  I have discussed the situation with the patient's daughter, Sheliah Mends, who is coming to get her.  She has been medicated in the ED and medications were sent to her pharmacy, by me.  Clinical Course as of 11/25/20 2233  Tue Nov 25, 2020  2128 I discussed case with Dr. Conchita Paris, who recommends medical treatment, and outpatient follow-up for possible injection if needed. [EW]  2214 MR LUMBAR SPINE WO CONTRAST [EW]    Clinical Course User Index [EW] Mancel Bale, MD     Medical screening examination/treatment/procedure(s) were conducted as a shared visit with non-physician practitioner(s) and myself.  I personally evaluated the patient during the encounter    Mancel Bale, MD 11/28/20 1357

## 2020-11-26 NOTE — ED Notes (Signed)
Pt was able to bear weight and move from bed to wheelchair with 1 person assist.

## 2020-11-26 NOTE — ED Notes (Signed)
DC instructions reviewed with family. Family verbalized understanding. Pt DC.

## 2020-12-03 DIAGNOSIS — R0600 Dyspnea, unspecified: Secondary | ICD-10-CM | POA: Diagnosis not present

## 2020-12-03 DIAGNOSIS — R6889 Other general symptoms and signs: Secondary | ICD-10-CM | POA: Diagnosis not present

## 2020-12-03 DIAGNOSIS — R0902 Hypoxemia: Secondary | ICD-10-CM | POA: Diagnosis not present

## 2020-12-03 DIAGNOSIS — J441 Chronic obstructive pulmonary disease with (acute) exacerbation: Secondary | ICD-10-CM | POA: Diagnosis not present

## 2020-12-03 DIAGNOSIS — Z743 Need for continuous supervision: Secondary | ICD-10-CM | POA: Diagnosis not present

## 2020-12-03 DIAGNOSIS — U071 COVID-19: Secondary | ICD-10-CM | POA: Diagnosis not present

## 2020-12-03 DIAGNOSIS — D649 Anemia, unspecified: Secondary | ICD-10-CM | POA: Diagnosis not present

## 2020-12-03 DIAGNOSIS — I499 Cardiac arrhythmia, unspecified: Secondary | ICD-10-CM | POA: Diagnosis not present

## 2020-12-04 DIAGNOSIS — N3000 Acute cystitis without hematuria: Secondary | ICD-10-CM | POA: Diagnosis not present

## 2020-12-04 DIAGNOSIS — E119 Type 2 diabetes mellitus without complications: Secondary | ICD-10-CM | POA: Diagnosis not present

## 2020-12-04 DIAGNOSIS — J9611 Chronic respiratory failure with hypoxia: Secondary | ICD-10-CM | POA: Diagnosis not present

## 2020-12-04 DIAGNOSIS — E785 Hyperlipidemia, unspecified: Secondary | ICD-10-CM | POA: Diagnosis not present

## 2020-12-04 DIAGNOSIS — J441 Chronic obstructive pulmonary disease with (acute) exacerbation: Secondary | ICD-10-CM | POA: Diagnosis not present

## 2020-12-04 DIAGNOSIS — I509 Heart failure, unspecified: Secondary | ICD-10-CM | POA: Diagnosis not present

## 2020-12-04 DIAGNOSIS — G9341 Metabolic encephalopathy: Secondary | ICD-10-CM | POA: Diagnosis not present

## 2020-12-04 DIAGNOSIS — K449 Diaphragmatic hernia without obstruction or gangrene: Secondary | ICD-10-CM | POA: Diagnosis not present

## 2020-12-04 DIAGNOSIS — D649 Anemia, unspecified: Secondary | ICD-10-CM | POA: Diagnosis not present

## 2020-12-04 DIAGNOSIS — I110 Hypertensive heart disease with heart failure: Secondary | ICD-10-CM | POA: Diagnosis not present

## 2020-12-04 DIAGNOSIS — R5381 Other malaise: Secondary | ICD-10-CM | POA: Diagnosis not present

## 2020-12-04 DIAGNOSIS — R269 Unspecified abnormalities of gait and mobility: Secondary | ICD-10-CM | POA: Diagnosis not present

## 2020-12-04 DIAGNOSIS — Z79899 Other long term (current) drug therapy: Secondary | ICD-10-CM | POA: Diagnosis not present

## 2020-12-04 DIAGNOSIS — G301 Alzheimer's disease with late onset: Secondary | ICD-10-CM | POA: Diagnosis not present

## 2020-12-04 DIAGNOSIS — Z993 Dependence on wheelchair: Secondary | ICD-10-CM | POA: Diagnosis not present

## 2020-12-04 DIAGNOSIS — E039 Hypothyroidism, unspecified: Secondary | ICD-10-CM | POA: Diagnosis not present

## 2020-12-04 DIAGNOSIS — U071 COVID-19: Secondary | ICD-10-CM | POA: Diagnosis not present

## 2020-12-04 DIAGNOSIS — E876 Hypokalemia: Secondary | ICD-10-CM | POA: Diagnosis not present

## 2020-12-04 DIAGNOSIS — R0902 Hypoxemia: Secondary | ICD-10-CM | POA: Diagnosis not present

## 2020-12-04 DIAGNOSIS — K219 Gastro-esophageal reflux disease without esophagitis: Secondary | ICD-10-CM | POA: Diagnosis not present

## 2020-12-04 DIAGNOSIS — R0600 Dyspnea, unspecified: Secondary | ICD-10-CM | POA: Diagnosis not present

## 2020-12-04 DIAGNOSIS — M069 Rheumatoid arthritis, unspecified: Secondary | ICD-10-CM | POA: Diagnosis not present

## 2020-12-05 DIAGNOSIS — J441 Chronic obstructive pulmonary disease with (acute) exacerbation: Secondary | ICD-10-CM | POA: Diagnosis not present

## 2020-12-10 DIAGNOSIS — R059 Cough, unspecified: Secondary | ICD-10-CM | POA: Diagnosis not present

## 2020-12-10 DIAGNOSIS — G9341 Metabolic encephalopathy: Secondary | ICD-10-CM | POA: Diagnosis not present

## 2020-12-10 DIAGNOSIS — R0600 Dyspnea, unspecified: Secondary | ICD-10-CM | POA: Diagnosis not present

## 2020-12-10 DIAGNOSIS — M069 Rheumatoid arthritis, unspecified: Secondary | ICD-10-CM | POA: Diagnosis not present

## 2020-12-10 DIAGNOSIS — G301 Alzheimer's disease with late onset: Secondary | ICD-10-CM | POA: Diagnosis not present

## 2020-12-10 DIAGNOSIS — E785 Hyperlipidemia, unspecified: Secondary | ICD-10-CM | POA: Diagnosis not present

## 2020-12-10 DIAGNOSIS — R404 Transient alteration of awareness: Secondary | ICD-10-CM | POA: Diagnosis not present

## 2020-12-10 DIAGNOSIS — E860 Dehydration: Secondary | ICD-10-CM | POA: Diagnosis not present

## 2020-12-10 DIAGNOSIS — D649 Anemia, unspecified: Secondary | ICD-10-CM | POA: Diagnosis not present

## 2020-12-10 DIAGNOSIS — J9611 Chronic respiratory failure with hypoxia: Secondary | ICD-10-CM | POA: Diagnosis not present

## 2020-12-10 DIAGNOSIS — I700 Atherosclerosis of aorta: Secondary | ICD-10-CM | POA: Diagnosis not present

## 2020-12-10 DIAGNOSIS — E119 Type 2 diabetes mellitus without complications: Secondary | ICD-10-CM | POA: Diagnosis not present

## 2020-12-10 DIAGNOSIS — R069 Unspecified abnormalities of breathing: Secondary | ICD-10-CM | POA: Diagnosis not present

## 2020-12-10 DIAGNOSIS — N179 Acute kidney failure, unspecified: Secondary | ICD-10-CM | POA: Diagnosis not present

## 2020-12-10 DIAGNOSIS — Z8616 Personal history of COVID-19: Secondary | ICD-10-CM | POA: Diagnosis not present

## 2020-12-10 DIAGNOSIS — Z8619 Personal history of other infectious and parasitic diseases: Secondary | ICD-10-CM | POA: Diagnosis not present

## 2020-12-10 DIAGNOSIS — Z743 Need for continuous supervision: Secondary | ICD-10-CM | POA: Diagnosis not present

## 2020-12-10 DIAGNOSIS — E876 Hypokalemia: Secondary | ICD-10-CM | POA: Diagnosis not present

## 2020-12-10 DIAGNOSIS — D638 Anemia in other chronic diseases classified elsewhere: Secondary | ICD-10-CM | POA: Diagnosis not present

## 2020-12-10 DIAGNOSIS — Z7951 Long term (current) use of inhaled steroids: Secondary | ICD-10-CM | POA: Diagnosis not present

## 2020-12-10 DIAGNOSIS — E039 Hypothyroidism, unspecified: Secondary | ICD-10-CM | POA: Diagnosis not present

## 2020-12-10 DIAGNOSIS — R55 Syncope and collapse: Secondary | ICD-10-CM | POA: Diagnosis not present

## 2020-12-10 DIAGNOSIS — I509 Heart failure, unspecified: Secondary | ICD-10-CM | POA: Diagnosis not present

## 2020-12-10 DIAGNOSIS — E7800 Pure hypercholesterolemia, unspecified: Secondary | ICD-10-CM | POA: Diagnosis not present

## 2020-12-10 DIAGNOSIS — K219 Gastro-esophageal reflux disease without esophagitis: Secondary | ICD-10-CM | POA: Diagnosis not present

## 2020-12-10 DIAGNOSIS — R5381 Other malaise: Secondary | ICD-10-CM | POA: Diagnosis not present

## 2020-12-10 DIAGNOSIS — J449 Chronic obstructive pulmonary disease, unspecified: Secondary | ICD-10-CM | POA: Diagnosis not present

## 2020-12-10 DIAGNOSIS — K449 Diaphragmatic hernia without obstruction or gangrene: Secondary | ICD-10-CM | POA: Diagnosis not present

## 2020-12-10 DIAGNOSIS — Z7984 Long term (current) use of oral hypoglycemic drugs: Secondary | ICD-10-CM | POA: Diagnosis not present

## 2020-12-10 DIAGNOSIS — I110 Hypertensive heart disease with heart failure: Secondary | ICD-10-CM | POA: Diagnosis not present

## 2020-12-10 DIAGNOSIS — U071 COVID-19: Secondary | ICD-10-CM | POA: Diagnosis not present

## 2020-12-10 DIAGNOSIS — J441 Chronic obstructive pulmonary disease with (acute) exacerbation: Secondary | ICD-10-CM | POA: Diagnosis not present

## 2020-12-10 DIAGNOSIS — Z87891 Personal history of nicotine dependence: Secondary | ICD-10-CM | POA: Diagnosis not present

## 2020-12-10 DIAGNOSIS — R0902 Hypoxemia: Secondary | ICD-10-CM | POA: Diagnosis not present

## 2020-12-10 DIAGNOSIS — Z87440 Personal history of urinary (tract) infections: Secondary | ICD-10-CM | POA: Diagnosis not present

## 2020-12-10 DIAGNOSIS — Z79899 Other long term (current) drug therapy: Secondary | ICD-10-CM | POA: Diagnosis not present

## 2020-12-10 DIAGNOSIS — R0602 Shortness of breath: Secondary | ICD-10-CM | POA: Diagnosis not present

## 2020-12-11 DIAGNOSIS — U071 COVID-19: Secondary | ICD-10-CM | POA: Diagnosis not present

## 2020-12-11 DIAGNOSIS — R0600 Dyspnea, unspecified: Secondary | ICD-10-CM | POA: Diagnosis not present

## 2020-12-11 DIAGNOSIS — R0902 Hypoxemia: Secondary | ICD-10-CM | POA: Diagnosis not present

## 2020-12-11 DIAGNOSIS — K449 Diaphragmatic hernia without obstruction or gangrene: Secondary | ICD-10-CM | POA: Diagnosis not present

## 2020-12-11 DIAGNOSIS — N179 Acute kidney failure, unspecified: Secondary | ICD-10-CM | POA: Diagnosis not present

## 2020-12-11 DIAGNOSIS — R0602 Shortness of breath: Secondary | ICD-10-CM | POA: Diagnosis not present

## 2020-12-11 DIAGNOSIS — R5381 Other malaise: Secondary | ICD-10-CM | POA: Diagnosis not present

## 2020-12-11 DIAGNOSIS — E860 Dehydration: Secondary | ICD-10-CM | POA: Diagnosis not present

## 2020-12-12 DIAGNOSIS — J449 Chronic obstructive pulmonary disease, unspecified: Secondary | ICD-10-CM | POA: Diagnosis not present

## 2020-12-12 DIAGNOSIS — E039 Hypothyroidism, unspecified: Secondary | ICD-10-CM | POA: Diagnosis not present

## 2020-12-12 DIAGNOSIS — R0902 Hypoxemia: Secondary | ICD-10-CM | POA: Diagnosis not present

## 2020-12-12 DIAGNOSIS — E119 Type 2 diabetes mellitus without complications: Secondary | ICD-10-CM | POA: Diagnosis not present

## 2020-12-12 DIAGNOSIS — R5381 Other malaise: Secondary | ICD-10-CM | POA: Diagnosis not present

## 2020-12-12 DIAGNOSIS — E860 Dehydration: Secondary | ICD-10-CM | POA: Diagnosis not present

## 2020-12-12 DIAGNOSIS — I509 Heart failure, unspecified: Secondary | ICD-10-CM | POA: Diagnosis not present

## 2020-12-12 DIAGNOSIS — D649 Anemia, unspecified: Secondary | ICD-10-CM | POA: Diagnosis not present

## 2020-12-12 DIAGNOSIS — E785 Hyperlipidemia, unspecified: Secondary | ICD-10-CM | POA: Diagnosis not present

## 2020-12-12 DIAGNOSIS — I110 Hypertensive heart disease with heart failure: Secondary | ICD-10-CM | POA: Diagnosis not present

## 2020-12-12 DIAGNOSIS — U071 COVID-19: Secondary | ICD-10-CM | POA: Diagnosis not present

## 2020-12-13 DIAGNOSIS — E860 Dehydration: Secondary | ICD-10-CM | POA: Diagnosis not present

## 2020-12-13 DIAGNOSIS — R0902 Hypoxemia: Secondary | ICD-10-CM | POA: Diagnosis not present

## 2020-12-13 DIAGNOSIS — U071 COVID-19: Secondary | ICD-10-CM | POA: Diagnosis not present

## 2020-12-13 DIAGNOSIS — I509 Heart failure, unspecified: Secondary | ICD-10-CM | POA: Diagnosis not present

## 2020-12-13 DIAGNOSIS — E039 Hypothyroidism, unspecified: Secondary | ICD-10-CM | POA: Diagnosis not present

## 2020-12-13 DIAGNOSIS — R5381 Other malaise: Secondary | ICD-10-CM | POA: Diagnosis not present

## 2020-12-13 DIAGNOSIS — E119 Type 2 diabetes mellitus without complications: Secondary | ICD-10-CM | POA: Diagnosis not present

## 2020-12-13 DIAGNOSIS — E785 Hyperlipidemia, unspecified: Secondary | ICD-10-CM | POA: Diagnosis not present

## 2020-12-13 DIAGNOSIS — J449 Chronic obstructive pulmonary disease, unspecified: Secondary | ICD-10-CM | POA: Diagnosis not present

## 2020-12-13 DIAGNOSIS — D649 Anemia, unspecified: Secondary | ICD-10-CM | POA: Diagnosis not present

## 2020-12-13 DIAGNOSIS — I110 Hypertensive heart disease with heart failure: Secondary | ICD-10-CM | POA: Diagnosis not present

## 2020-12-14 DIAGNOSIS — E785 Hyperlipidemia, unspecified: Secondary | ICD-10-CM | POA: Diagnosis not present

## 2020-12-14 DIAGNOSIS — D649 Anemia, unspecified: Secondary | ICD-10-CM | POA: Diagnosis not present

## 2020-12-14 DIAGNOSIS — E039 Hypothyroidism, unspecified: Secondary | ICD-10-CM | POA: Diagnosis not present

## 2020-12-14 DIAGNOSIS — U071 COVID-19: Secondary | ICD-10-CM | POA: Diagnosis not present

## 2020-12-14 DIAGNOSIS — E860 Dehydration: Secondary | ICD-10-CM | POA: Diagnosis not present

## 2020-12-14 DIAGNOSIS — E119 Type 2 diabetes mellitus without complications: Secondary | ICD-10-CM | POA: Diagnosis not present

## 2020-12-14 DIAGNOSIS — I110 Hypertensive heart disease with heart failure: Secondary | ICD-10-CM | POA: Diagnosis not present

## 2020-12-14 DIAGNOSIS — J449 Chronic obstructive pulmonary disease, unspecified: Secondary | ICD-10-CM | POA: Diagnosis not present

## 2020-12-14 DIAGNOSIS — I509 Heart failure, unspecified: Secondary | ICD-10-CM | POA: Diagnosis not present

## 2020-12-14 DIAGNOSIS — R5381 Other malaise: Secondary | ICD-10-CM | POA: Diagnosis not present

## 2020-12-14 DIAGNOSIS — R0902 Hypoxemia: Secondary | ICD-10-CM | POA: Diagnosis not present

## 2020-12-15 DIAGNOSIS — I509 Heart failure, unspecified: Secondary | ICD-10-CM | POA: Diagnosis not present

## 2020-12-15 DIAGNOSIS — R0600 Dyspnea, unspecified: Secondary | ICD-10-CM | POA: Diagnosis not present

## 2020-12-15 DIAGNOSIS — R5381 Other malaise: Secondary | ICD-10-CM | POA: Diagnosis not present

## 2020-12-15 DIAGNOSIS — U071 COVID-19: Secondary | ICD-10-CM | POA: Diagnosis not present

## 2020-12-15 DIAGNOSIS — D649 Anemia, unspecified: Secondary | ICD-10-CM | POA: Diagnosis not present

## 2020-12-15 DIAGNOSIS — Z79899 Other long term (current) drug therapy: Secondary | ICD-10-CM | POA: Diagnosis not present

## 2020-12-15 DIAGNOSIS — Z7984 Long term (current) use of oral hypoglycemic drugs: Secondary | ICD-10-CM | POA: Diagnosis not present

## 2020-12-15 DIAGNOSIS — I110 Hypertensive heart disease with heart failure: Secondary | ICD-10-CM | POA: Diagnosis not present

## 2020-12-15 DIAGNOSIS — J449 Chronic obstructive pulmonary disease, unspecified: Secondary | ICD-10-CM | POA: Diagnosis not present

## 2020-12-15 DIAGNOSIS — R0902 Hypoxemia: Secondary | ICD-10-CM | POA: Diagnosis not present

## 2020-12-15 DIAGNOSIS — E860 Dehydration: Secondary | ICD-10-CM | POA: Diagnosis not present

## 2020-12-15 DIAGNOSIS — E119 Type 2 diabetes mellitus without complications: Secondary | ICD-10-CM | POA: Diagnosis not present

## 2020-12-16 DIAGNOSIS — R0902 Hypoxemia: Secondary | ICD-10-CM | POA: Diagnosis not present

## 2020-12-16 DIAGNOSIS — R0600 Dyspnea, unspecified: Secondary | ICD-10-CM | POA: Diagnosis not present

## 2020-12-16 DIAGNOSIS — E860 Dehydration: Secondary | ICD-10-CM | POA: Diagnosis not present

## 2020-12-16 DIAGNOSIS — R5381 Other malaise: Secondary | ICD-10-CM | POA: Diagnosis not present

## 2020-12-16 DIAGNOSIS — U071 COVID-19: Secondary | ICD-10-CM | POA: Diagnosis not present

## 2020-12-17 DIAGNOSIS — D649 Anemia, unspecified: Secondary | ICD-10-CM | POA: Diagnosis not present

## 2020-12-17 DIAGNOSIS — E039 Hypothyroidism, unspecified: Secondary | ICD-10-CM | POA: Diagnosis not present

## 2020-12-17 DIAGNOSIS — I110 Hypertensive heart disease with heart failure: Secondary | ICD-10-CM | POA: Diagnosis not present

## 2020-12-17 DIAGNOSIS — E785 Hyperlipidemia, unspecified: Secondary | ICD-10-CM | POA: Diagnosis not present

## 2020-12-17 DIAGNOSIS — R0902 Hypoxemia: Secondary | ICD-10-CM | POA: Diagnosis not present

## 2020-12-17 DIAGNOSIS — E876 Hypokalemia: Secondary | ICD-10-CM | POA: Diagnosis not present

## 2020-12-17 DIAGNOSIS — E119 Type 2 diabetes mellitus without complications: Secondary | ICD-10-CM | POA: Diagnosis not present

## 2020-12-17 DIAGNOSIS — R5381 Other malaise: Secondary | ICD-10-CM | POA: Diagnosis not present

## 2020-12-17 DIAGNOSIS — I509 Heart failure, unspecified: Secondary | ICD-10-CM | POA: Diagnosis not present

## 2020-12-19 DIAGNOSIS — E7800 Pure hypercholesterolemia, unspecified: Secondary | ICD-10-CM | POA: Diagnosis not present

## 2020-12-19 DIAGNOSIS — J9611 Chronic respiratory failure with hypoxia: Secondary | ICD-10-CM | POA: Diagnosis not present

## 2020-12-19 DIAGNOSIS — Z8619 Personal history of other infectious and parasitic diseases: Secondary | ICD-10-CM | POA: Diagnosis not present

## 2020-12-19 DIAGNOSIS — Z87440 Personal history of urinary (tract) infections: Secondary | ICD-10-CM | POA: Diagnosis not present

## 2020-12-19 DIAGNOSIS — Z7951 Long term (current) use of inhaled steroids: Secondary | ICD-10-CM | POA: Diagnosis not present

## 2020-12-19 DIAGNOSIS — I110 Hypertensive heart disease with heart failure: Secondary | ICD-10-CM | POA: Diagnosis not present

## 2020-12-19 DIAGNOSIS — E039 Hypothyroidism, unspecified: Secondary | ICD-10-CM | POA: Diagnosis not present

## 2020-12-19 DIAGNOSIS — M069 Rheumatoid arthritis, unspecified: Secondary | ICD-10-CM | POA: Diagnosis not present

## 2020-12-19 DIAGNOSIS — E119 Type 2 diabetes mellitus without complications: Secondary | ICD-10-CM | POA: Diagnosis not present

## 2020-12-19 DIAGNOSIS — J441 Chronic obstructive pulmonary disease with (acute) exacerbation: Secondary | ICD-10-CM | POA: Diagnosis not present

## 2020-12-19 DIAGNOSIS — D638 Anemia in other chronic diseases classified elsewhere: Secondary | ICD-10-CM | POA: Diagnosis not present

## 2020-12-19 DIAGNOSIS — G9341 Metabolic encephalopathy: Secondary | ICD-10-CM | POA: Diagnosis not present

## 2020-12-19 DIAGNOSIS — I700 Atherosclerosis of aorta: Secondary | ICD-10-CM | POA: Diagnosis not present

## 2020-12-19 DIAGNOSIS — K219 Gastro-esophageal reflux disease without esophagitis: Secondary | ICD-10-CM | POA: Diagnosis not present

## 2020-12-19 DIAGNOSIS — N179 Acute kidney failure, unspecified: Secondary | ICD-10-CM | POA: Diagnosis not present

## 2020-12-19 DIAGNOSIS — I509 Heart failure, unspecified: Secondary | ICD-10-CM | POA: Diagnosis not present

## 2020-12-19 DIAGNOSIS — Z7984 Long term (current) use of oral hypoglycemic drugs: Secondary | ICD-10-CM | POA: Diagnosis not present

## 2020-12-19 DIAGNOSIS — U071 COVID-19: Secondary | ICD-10-CM | POA: Diagnosis not present

## 2020-12-19 DIAGNOSIS — E876 Hypokalemia: Secondary | ICD-10-CM | POA: Diagnosis not present

## 2020-12-19 DIAGNOSIS — K449 Diaphragmatic hernia without obstruction or gangrene: Secondary | ICD-10-CM | POA: Diagnosis not present

## 2020-12-19 DIAGNOSIS — G301 Alzheimer's disease with late onset: Secondary | ICD-10-CM | POA: Diagnosis not present

## 2020-12-25 DIAGNOSIS — D509 Iron deficiency anemia, unspecified: Secondary | ICD-10-CM | POA: Diagnosis not present

## 2020-12-25 DIAGNOSIS — E039 Hypothyroidism, unspecified: Secondary | ICD-10-CM | POA: Diagnosis not present

## 2020-12-25 DIAGNOSIS — J449 Chronic obstructive pulmonary disease, unspecified: Secondary | ICD-10-CM | POA: Diagnosis not present

## 2020-12-25 DIAGNOSIS — E1169 Type 2 diabetes mellitus with other specified complication: Secondary | ICD-10-CM | POA: Diagnosis not present

## 2020-12-25 DIAGNOSIS — E785 Hyperlipidemia, unspecified: Secondary | ICD-10-CM | POA: Diagnosis not present

## 2020-12-26 DIAGNOSIS — E119 Type 2 diabetes mellitus without complications: Secondary | ICD-10-CM | POA: Diagnosis not present

## 2020-12-26 DIAGNOSIS — D638 Anemia in other chronic diseases classified elsewhere: Secondary | ICD-10-CM | POA: Diagnosis not present

## 2020-12-26 DIAGNOSIS — E039 Hypothyroidism, unspecified: Secondary | ICD-10-CM | POA: Diagnosis not present

## 2020-12-26 DIAGNOSIS — G9341 Metabolic encephalopathy: Secondary | ICD-10-CM | POA: Diagnosis not present

## 2020-12-26 DIAGNOSIS — Z7951 Long term (current) use of inhaled steroids: Secondary | ICD-10-CM | POA: Diagnosis not present

## 2020-12-26 DIAGNOSIS — Z87440 Personal history of urinary (tract) infections: Secondary | ICD-10-CM | POA: Diagnosis not present

## 2020-12-26 DIAGNOSIS — N179 Acute kidney failure, unspecified: Secondary | ICD-10-CM | POA: Diagnosis not present

## 2020-12-26 DIAGNOSIS — E7800 Pure hypercholesterolemia, unspecified: Secondary | ICD-10-CM | POA: Diagnosis not present

## 2020-12-26 DIAGNOSIS — K219 Gastro-esophageal reflux disease without esophagitis: Secondary | ICD-10-CM | POA: Diagnosis not present

## 2020-12-26 DIAGNOSIS — Z8619 Personal history of other infectious and parasitic diseases: Secondary | ICD-10-CM | POA: Diagnosis not present

## 2020-12-26 DIAGNOSIS — J441 Chronic obstructive pulmonary disease with (acute) exacerbation: Secondary | ICD-10-CM | POA: Diagnosis not present

## 2020-12-26 DIAGNOSIS — E876 Hypokalemia: Secondary | ICD-10-CM | POA: Diagnosis not present

## 2020-12-26 DIAGNOSIS — U071 COVID-19: Secondary | ICD-10-CM | POA: Diagnosis not present

## 2020-12-26 DIAGNOSIS — J9611 Chronic respiratory failure with hypoxia: Secondary | ICD-10-CM | POA: Diagnosis not present

## 2020-12-26 DIAGNOSIS — K449 Diaphragmatic hernia without obstruction or gangrene: Secondary | ICD-10-CM | POA: Diagnosis not present

## 2020-12-26 DIAGNOSIS — E1169 Type 2 diabetes mellitus with other specified complication: Secondary | ICD-10-CM | POA: Diagnosis not present

## 2020-12-26 DIAGNOSIS — G301 Alzheimer's disease with late onset: Secondary | ICD-10-CM | POA: Diagnosis not present

## 2020-12-26 DIAGNOSIS — Z7984 Long term (current) use of oral hypoglycemic drugs: Secondary | ICD-10-CM | POA: Diagnosis not present

## 2020-12-26 DIAGNOSIS — I110 Hypertensive heart disease with heart failure: Secondary | ICD-10-CM | POA: Diagnosis not present

## 2020-12-26 DIAGNOSIS — I509 Heart failure, unspecified: Secondary | ICD-10-CM | POA: Diagnosis not present

## 2020-12-26 DIAGNOSIS — I700 Atherosclerosis of aorta: Secondary | ICD-10-CM | POA: Diagnosis not present

## 2020-12-26 DIAGNOSIS — M069 Rheumatoid arthritis, unspecified: Secondary | ICD-10-CM | POA: Diagnosis not present

## 2020-12-29 DIAGNOSIS — G301 Alzheimer's disease with late onset: Secondary | ICD-10-CM | POA: Diagnosis not present

## 2020-12-29 DIAGNOSIS — K449 Diaphragmatic hernia without obstruction or gangrene: Secondary | ICD-10-CM | POA: Diagnosis not present

## 2020-12-29 DIAGNOSIS — E039 Hypothyroidism, unspecified: Secondary | ICD-10-CM | POA: Diagnosis not present

## 2020-12-29 DIAGNOSIS — Z87440 Personal history of urinary (tract) infections: Secondary | ICD-10-CM | POA: Diagnosis not present

## 2020-12-29 DIAGNOSIS — N179 Acute kidney failure, unspecified: Secondary | ICD-10-CM | POA: Diagnosis not present

## 2020-12-29 DIAGNOSIS — E876 Hypokalemia: Secondary | ICD-10-CM | POA: Diagnosis not present

## 2020-12-29 DIAGNOSIS — E119 Type 2 diabetes mellitus without complications: Secondary | ICD-10-CM | POA: Diagnosis not present

## 2020-12-29 DIAGNOSIS — U071 COVID-19: Secondary | ICD-10-CM | POA: Diagnosis not present

## 2020-12-29 DIAGNOSIS — Z7951 Long term (current) use of inhaled steroids: Secondary | ICD-10-CM | POA: Diagnosis not present

## 2020-12-29 DIAGNOSIS — M069 Rheumatoid arthritis, unspecified: Secondary | ICD-10-CM | POA: Diagnosis not present

## 2020-12-29 DIAGNOSIS — I700 Atherosclerosis of aorta: Secondary | ICD-10-CM | POA: Diagnosis not present

## 2020-12-29 DIAGNOSIS — D638 Anemia in other chronic diseases classified elsewhere: Secondary | ICD-10-CM | POA: Diagnosis not present

## 2020-12-29 DIAGNOSIS — I509 Heart failure, unspecified: Secondary | ICD-10-CM | POA: Diagnosis not present

## 2020-12-29 DIAGNOSIS — G9341 Metabolic encephalopathy: Secondary | ICD-10-CM | POA: Diagnosis not present

## 2020-12-29 DIAGNOSIS — J9611 Chronic respiratory failure with hypoxia: Secondary | ICD-10-CM | POA: Diagnosis not present

## 2020-12-29 DIAGNOSIS — I110 Hypertensive heart disease with heart failure: Secondary | ICD-10-CM | POA: Diagnosis not present

## 2020-12-29 DIAGNOSIS — J441 Chronic obstructive pulmonary disease with (acute) exacerbation: Secondary | ICD-10-CM | POA: Diagnosis not present

## 2020-12-29 DIAGNOSIS — Z7984 Long term (current) use of oral hypoglycemic drugs: Secondary | ICD-10-CM | POA: Diagnosis not present

## 2020-12-29 DIAGNOSIS — E7800 Pure hypercholesterolemia, unspecified: Secondary | ICD-10-CM | POA: Diagnosis not present

## 2020-12-29 DIAGNOSIS — K219 Gastro-esophageal reflux disease without esophagitis: Secondary | ICD-10-CM | POA: Diagnosis not present

## 2020-12-29 DIAGNOSIS — Z8619 Personal history of other infectious and parasitic diseases: Secondary | ICD-10-CM | POA: Diagnosis not present

## 2020-12-30 DIAGNOSIS — Z8619 Personal history of other infectious and parasitic diseases: Secondary | ICD-10-CM | POA: Diagnosis not present

## 2020-12-30 DIAGNOSIS — Z7984 Long term (current) use of oral hypoglycemic drugs: Secondary | ICD-10-CM | POA: Diagnosis not present

## 2020-12-30 DIAGNOSIS — Z7951 Long term (current) use of inhaled steroids: Secondary | ICD-10-CM | POA: Diagnosis not present

## 2020-12-30 DIAGNOSIS — N179 Acute kidney failure, unspecified: Secondary | ICD-10-CM | POA: Diagnosis not present

## 2020-12-30 DIAGNOSIS — U071 COVID-19: Secondary | ICD-10-CM | POA: Diagnosis not present

## 2020-12-30 DIAGNOSIS — E876 Hypokalemia: Secondary | ICD-10-CM | POA: Diagnosis not present

## 2020-12-30 DIAGNOSIS — I509 Heart failure, unspecified: Secondary | ICD-10-CM | POA: Diagnosis not present

## 2020-12-30 DIAGNOSIS — G301 Alzheimer's disease with late onset: Secondary | ICD-10-CM | POA: Diagnosis not present

## 2020-12-30 DIAGNOSIS — M069 Rheumatoid arthritis, unspecified: Secondary | ICD-10-CM | POA: Diagnosis not present

## 2020-12-30 DIAGNOSIS — I700 Atherosclerosis of aorta: Secondary | ICD-10-CM | POA: Diagnosis not present

## 2020-12-30 DIAGNOSIS — G9341 Metabolic encephalopathy: Secondary | ICD-10-CM | POA: Diagnosis not present

## 2020-12-30 DIAGNOSIS — E039 Hypothyroidism, unspecified: Secondary | ICD-10-CM | POA: Diagnosis not present

## 2020-12-30 DIAGNOSIS — I110 Hypertensive heart disease with heart failure: Secondary | ICD-10-CM | POA: Diagnosis not present

## 2020-12-30 DIAGNOSIS — J9611 Chronic respiratory failure with hypoxia: Secondary | ICD-10-CM | POA: Diagnosis not present

## 2020-12-30 DIAGNOSIS — K219 Gastro-esophageal reflux disease without esophagitis: Secondary | ICD-10-CM | POA: Diagnosis not present

## 2020-12-30 DIAGNOSIS — E7800 Pure hypercholesterolemia, unspecified: Secondary | ICD-10-CM | POA: Diagnosis not present

## 2020-12-30 DIAGNOSIS — K449 Diaphragmatic hernia without obstruction or gangrene: Secondary | ICD-10-CM | POA: Diagnosis not present

## 2020-12-30 DIAGNOSIS — J441 Chronic obstructive pulmonary disease with (acute) exacerbation: Secondary | ICD-10-CM | POA: Diagnosis not present

## 2020-12-30 DIAGNOSIS — Z87440 Personal history of urinary (tract) infections: Secondary | ICD-10-CM | POA: Diagnosis not present

## 2020-12-30 DIAGNOSIS — D638 Anemia in other chronic diseases classified elsewhere: Secondary | ICD-10-CM | POA: Diagnosis not present

## 2020-12-30 DIAGNOSIS — E119 Type 2 diabetes mellitus without complications: Secondary | ICD-10-CM | POA: Diagnosis not present

## 2020-12-31 DIAGNOSIS — Z7984 Long term (current) use of oral hypoglycemic drugs: Secondary | ICD-10-CM | POA: Diagnosis not present

## 2020-12-31 DIAGNOSIS — D638 Anemia in other chronic diseases classified elsewhere: Secondary | ICD-10-CM | POA: Diagnosis not present

## 2020-12-31 DIAGNOSIS — K219 Gastro-esophageal reflux disease without esophagitis: Secondary | ICD-10-CM | POA: Diagnosis not present

## 2020-12-31 DIAGNOSIS — Z8619 Personal history of other infectious and parasitic diseases: Secondary | ICD-10-CM | POA: Diagnosis not present

## 2020-12-31 DIAGNOSIS — I110 Hypertensive heart disease with heart failure: Secondary | ICD-10-CM | POA: Diagnosis not present

## 2020-12-31 DIAGNOSIS — Z7951 Long term (current) use of inhaled steroids: Secondary | ICD-10-CM | POA: Diagnosis not present

## 2020-12-31 DIAGNOSIS — I509 Heart failure, unspecified: Secondary | ICD-10-CM | POA: Diagnosis not present

## 2020-12-31 DIAGNOSIS — K449 Diaphragmatic hernia without obstruction or gangrene: Secondary | ICD-10-CM | POA: Diagnosis not present

## 2020-12-31 DIAGNOSIS — J441 Chronic obstructive pulmonary disease with (acute) exacerbation: Secondary | ICD-10-CM | POA: Diagnosis not present

## 2020-12-31 DIAGNOSIS — U071 COVID-19: Secondary | ICD-10-CM | POA: Diagnosis not present

## 2020-12-31 DIAGNOSIS — E7800 Pure hypercholesterolemia, unspecified: Secondary | ICD-10-CM | POA: Diagnosis not present

## 2020-12-31 DIAGNOSIS — Z87440 Personal history of urinary (tract) infections: Secondary | ICD-10-CM | POA: Diagnosis not present

## 2020-12-31 DIAGNOSIS — N179 Acute kidney failure, unspecified: Secondary | ICD-10-CM | POA: Diagnosis not present

## 2020-12-31 DIAGNOSIS — I700 Atherosclerosis of aorta: Secondary | ICD-10-CM | POA: Diagnosis not present

## 2020-12-31 DIAGNOSIS — M069 Rheumatoid arthritis, unspecified: Secondary | ICD-10-CM | POA: Diagnosis not present

## 2020-12-31 DIAGNOSIS — E876 Hypokalemia: Secondary | ICD-10-CM | POA: Diagnosis not present

## 2020-12-31 DIAGNOSIS — G301 Alzheimer's disease with late onset: Secondary | ICD-10-CM | POA: Diagnosis not present

## 2020-12-31 DIAGNOSIS — G9341 Metabolic encephalopathy: Secondary | ICD-10-CM | POA: Diagnosis not present

## 2020-12-31 DIAGNOSIS — E119 Type 2 diabetes mellitus without complications: Secondary | ICD-10-CM | POA: Diagnosis not present

## 2020-12-31 DIAGNOSIS — E039 Hypothyroidism, unspecified: Secondary | ICD-10-CM | POA: Diagnosis not present

## 2020-12-31 DIAGNOSIS — J9611 Chronic respiratory failure with hypoxia: Secondary | ICD-10-CM | POA: Diagnosis not present

## 2021-01-06 DIAGNOSIS — N179 Acute kidney failure, unspecified: Secondary | ICD-10-CM | POA: Diagnosis not present

## 2021-01-06 DIAGNOSIS — Z8619 Personal history of other infectious and parasitic diseases: Secondary | ICD-10-CM | POA: Diagnosis not present

## 2021-01-06 DIAGNOSIS — Z87440 Personal history of urinary (tract) infections: Secondary | ICD-10-CM | POA: Diagnosis not present

## 2021-01-06 DIAGNOSIS — G301 Alzheimer's disease with late onset: Secondary | ICD-10-CM | POA: Diagnosis not present

## 2021-01-06 DIAGNOSIS — K219 Gastro-esophageal reflux disease without esophagitis: Secondary | ICD-10-CM | POA: Diagnosis not present

## 2021-01-06 DIAGNOSIS — G9341 Metabolic encephalopathy: Secondary | ICD-10-CM | POA: Diagnosis not present

## 2021-01-06 DIAGNOSIS — Z7951 Long term (current) use of inhaled steroids: Secondary | ICD-10-CM | POA: Diagnosis not present

## 2021-01-06 DIAGNOSIS — I700 Atherosclerosis of aorta: Secondary | ICD-10-CM | POA: Diagnosis not present

## 2021-01-06 DIAGNOSIS — M069 Rheumatoid arthritis, unspecified: Secondary | ICD-10-CM | POA: Diagnosis not present

## 2021-01-06 DIAGNOSIS — I110 Hypertensive heart disease with heart failure: Secondary | ICD-10-CM | POA: Diagnosis not present

## 2021-01-06 DIAGNOSIS — E119 Type 2 diabetes mellitus without complications: Secondary | ICD-10-CM | POA: Diagnosis not present

## 2021-01-06 DIAGNOSIS — I509 Heart failure, unspecified: Secondary | ICD-10-CM | POA: Diagnosis not present

## 2021-01-06 DIAGNOSIS — J9611 Chronic respiratory failure with hypoxia: Secondary | ICD-10-CM | POA: Diagnosis not present

## 2021-01-06 DIAGNOSIS — E7800 Pure hypercholesterolemia, unspecified: Secondary | ICD-10-CM | POA: Diagnosis not present

## 2021-01-06 DIAGNOSIS — U071 COVID-19: Secondary | ICD-10-CM | POA: Diagnosis not present

## 2021-01-06 DIAGNOSIS — D638 Anemia in other chronic diseases classified elsewhere: Secondary | ICD-10-CM | POA: Diagnosis not present

## 2021-01-06 DIAGNOSIS — J441 Chronic obstructive pulmonary disease with (acute) exacerbation: Secondary | ICD-10-CM | POA: Diagnosis not present

## 2021-01-06 DIAGNOSIS — K449 Diaphragmatic hernia without obstruction or gangrene: Secondary | ICD-10-CM | POA: Diagnosis not present

## 2021-01-06 DIAGNOSIS — E876 Hypokalemia: Secondary | ICD-10-CM | POA: Diagnosis not present

## 2021-01-06 DIAGNOSIS — E039 Hypothyroidism, unspecified: Secondary | ICD-10-CM | POA: Diagnosis not present

## 2021-01-07 DIAGNOSIS — J9611 Chronic respiratory failure with hypoxia: Secondary | ICD-10-CM | POA: Diagnosis not present

## 2021-01-07 DIAGNOSIS — J441 Chronic obstructive pulmonary disease with (acute) exacerbation: Secondary | ICD-10-CM | POA: Diagnosis not present

## 2021-01-07 DIAGNOSIS — Z8619 Personal history of other infectious and parasitic diseases: Secondary | ICD-10-CM | POA: Diagnosis not present

## 2021-01-07 DIAGNOSIS — M069 Rheumatoid arthritis, unspecified: Secondary | ICD-10-CM | POA: Diagnosis not present

## 2021-01-07 DIAGNOSIS — E876 Hypokalemia: Secondary | ICD-10-CM | POA: Diagnosis not present

## 2021-01-07 DIAGNOSIS — K449 Diaphragmatic hernia without obstruction or gangrene: Secondary | ICD-10-CM | POA: Diagnosis not present

## 2021-01-07 DIAGNOSIS — I700 Atherosclerosis of aorta: Secondary | ICD-10-CM | POA: Diagnosis not present

## 2021-01-07 DIAGNOSIS — E7800 Pure hypercholesterolemia, unspecified: Secondary | ICD-10-CM | POA: Diagnosis not present

## 2021-01-07 DIAGNOSIS — Z7951 Long term (current) use of inhaled steroids: Secondary | ICD-10-CM | POA: Diagnosis not present

## 2021-01-07 DIAGNOSIS — Z87440 Personal history of urinary (tract) infections: Secondary | ICD-10-CM | POA: Diagnosis not present

## 2021-01-07 DIAGNOSIS — E119 Type 2 diabetes mellitus without complications: Secondary | ICD-10-CM | POA: Diagnosis not present

## 2021-01-07 DIAGNOSIS — N179 Acute kidney failure, unspecified: Secondary | ICD-10-CM | POA: Diagnosis not present

## 2021-01-07 DIAGNOSIS — G9341 Metabolic encephalopathy: Secondary | ICD-10-CM | POA: Diagnosis not present

## 2021-01-07 DIAGNOSIS — I110 Hypertensive heart disease with heart failure: Secondary | ICD-10-CM | POA: Diagnosis not present

## 2021-01-07 DIAGNOSIS — D638 Anemia in other chronic diseases classified elsewhere: Secondary | ICD-10-CM | POA: Diagnosis not present

## 2021-01-07 DIAGNOSIS — U071 COVID-19: Secondary | ICD-10-CM | POA: Diagnosis not present

## 2021-01-07 DIAGNOSIS — I509 Heart failure, unspecified: Secondary | ICD-10-CM | POA: Diagnosis not present

## 2021-01-07 DIAGNOSIS — K219 Gastro-esophageal reflux disease without esophagitis: Secondary | ICD-10-CM | POA: Diagnosis not present

## 2021-01-07 DIAGNOSIS — E039 Hypothyroidism, unspecified: Secondary | ICD-10-CM | POA: Diagnosis not present

## 2021-01-07 DIAGNOSIS — G301 Alzheimer's disease with late onset: Secondary | ICD-10-CM | POA: Diagnosis not present

## 2021-01-09 DIAGNOSIS — E876 Hypokalemia: Secondary | ICD-10-CM | POA: Diagnosis not present

## 2021-01-09 DIAGNOSIS — Z7951 Long term (current) use of inhaled steroids: Secondary | ICD-10-CM | POA: Diagnosis not present

## 2021-01-09 DIAGNOSIS — G301 Alzheimer's disease with late onset: Secondary | ICD-10-CM | POA: Diagnosis not present

## 2021-01-09 DIAGNOSIS — Z8619 Personal history of other infectious and parasitic diseases: Secondary | ICD-10-CM | POA: Diagnosis not present

## 2021-01-09 DIAGNOSIS — E119 Type 2 diabetes mellitus without complications: Secondary | ICD-10-CM | POA: Diagnosis not present

## 2021-01-09 DIAGNOSIS — J9611 Chronic respiratory failure with hypoxia: Secondary | ICD-10-CM | POA: Diagnosis not present

## 2021-01-09 DIAGNOSIS — I700 Atherosclerosis of aorta: Secondary | ICD-10-CM | POA: Diagnosis not present

## 2021-01-09 DIAGNOSIS — D638 Anemia in other chronic diseases classified elsewhere: Secondary | ICD-10-CM | POA: Diagnosis not present

## 2021-01-09 DIAGNOSIS — G9341 Metabolic encephalopathy: Secondary | ICD-10-CM | POA: Diagnosis not present

## 2021-01-09 DIAGNOSIS — J441 Chronic obstructive pulmonary disease with (acute) exacerbation: Secondary | ICD-10-CM | POA: Diagnosis not present

## 2021-01-09 DIAGNOSIS — K219 Gastro-esophageal reflux disease without esophagitis: Secondary | ICD-10-CM | POA: Diagnosis not present

## 2021-01-09 DIAGNOSIS — Z87440 Personal history of urinary (tract) infections: Secondary | ICD-10-CM | POA: Diagnosis not present

## 2021-01-09 DIAGNOSIS — K449 Diaphragmatic hernia without obstruction or gangrene: Secondary | ICD-10-CM | POA: Diagnosis not present

## 2021-01-09 DIAGNOSIS — U071 COVID-19: Secondary | ICD-10-CM | POA: Diagnosis not present

## 2021-01-09 DIAGNOSIS — M069 Rheumatoid arthritis, unspecified: Secondary | ICD-10-CM | POA: Diagnosis not present

## 2021-01-09 DIAGNOSIS — N179 Acute kidney failure, unspecified: Secondary | ICD-10-CM | POA: Diagnosis not present

## 2021-01-09 DIAGNOSIS — E039 Hypothyroidism, unspecified: Secondary | ICD-10-CM | POA: Diagnosis not present

## 2021-01-09 DIAGNOSIS — I110 Hypertensive heart disease with heart failure: Secondary | ICD-10-CM | POA: Diagnosis not present

## 2021-01-09 DIAGNOSIS — I509 Heart failure, unspecified: Secondary | ICD-10-CM | POA: Diagnosis not present

## 2021-01-09 DIAGNOSIS — E7800 Pure hypercholesterolemia, unspecified: Secondary | ICD-10-CM | POA: Diagnosis not present

## 2021-01-13 DIAGNOSIS — G9341 Metabolic encephalopathy: Secondary | ICD-10-CM | POA: Diagnosis not present

## 2021-01-13 DIAGNOSIS — M069 Rheumatoid arthritis, unspecified: Secondary | ICD-10-CM | POA: Diagnosis not present

## 2021-01-13 DIAGNOSIS — E7800 Pure hypercholesterolemia, unspecified: Secondary | ICD-10-CM | POA: Diagnosis not present

## 2021-01-13 DIAGNOSIS — E876 Hypokalemia: Secondary | ICD-10-CM | POA: Diagnosis not present

## 2021-01-13 DIAGNOSIS — I700 Atherosclerosis of aorta: Secondary | ICD-10-CM | POA: Diagnosis not present

## 2021-01-13 DIAGNOSIS — Z8619 Personal history of other infectious and parasitic diseases: Secondary | ICD-10-CM | POA: Diagnosis not present

## 2021-01-13 DIAGNOSIS — N179 Acute kidney failure, unspecified: Secondary | ICD-10-CM | POA: Diagnosis not present

## 2021-01-13 DIAGNOSIS — E119 Type 2 diabetes mellitus without complications: Secondary | ICD-10-CM | POA: Diagnosis not present

## 2021-01-13 DIAGNOSIS — I110 Hypertensive heart disease with heart failure: Secondary | ICD-10-CM | POA: Diagnosis not present

## 2021-01-13 DIAGNOSIS — K219 Gastro-esophageal reflux disease without esophagitis: Secondary | ICD-10-CM | POA: Diagnosis not present

## 2021-01-13 DIAGNOSIS — Z7951 Long term (current) use of inhaled steroids: Secondary | ICD-10-CM | POA: Diagnosis not present

## 2021-01-13 DIAGNOSIS — G301 Alzheimer's disease with late onset: Secondary | ICD-10-CM | POA: Diagnosis not present

## 2021-01-13 DIAGNOSIS — J441 Chronic obstructive pulmonary disease with (acute) exacerbation: Secondary | ICD-10-CM | POA: Diagnosis not present

## 2021-01-13 DIAGNOSIS — U071 COVID-19: Secondary | ICD-10-CM | POA: Diagnosis not present

## 2021-01-13 DIAGNOSIS — E039 Hypothyroidism, unspecified: Secondary | ICD-10-CM | POA: Diagnosis not present

## 2021-01-13 DIAGNOSIS — D638 Anemia in other chronic diseases classified elsewhere: Secondary | ICD-10-CM | POA: Diagnosis not present

## 2021-01-13 DIAGNOSIS — Z87440 Personal history of urinary (tract) infections: Secondary | ICD-10-CM | POA: Diagnosis not present

## 2021-01-13 DIAGNOSIS — K449 Diaphragmatic hernia without obstruction or gangrene: Secondary | ICD-10-CM | POA: Diagnosis not present

## 2021-01-13 DIAGNOSIS — J9611 Chronic respiratory failure with hypoxia: Secondary | ICD-10-CM | POA: Diagnosis not present

## 2021-01-13 DIAGNOSIS — I509 Heart failure, unspecified: Secondary | ICD-10-CM | POA: Diagnosis not present

## 2021-01-14 DIAGNOSIS — Z87440 Personal history of urinary (tract) infections: Secondary | ICD-10-CM | POA: Diagnosis not present

## 2021-01-14 DIAGNOSIS — K219 Gastro-esophageal reflux disease without esophagitis: Secondary | ICD-10-CM | POA: Diagnosis not present

## 2021-01-14 DIAGNOSIS — U071 COVID-19: Secondary | ICD-10-CM | POA: Diagnosis not present

## 2021-01-14 DIAGNOSIS — G301 Alzheimer's disease with late onset: Secondary | ICD-10-CM | POA: Diagnosis not present

## 2021-01-14 DIAGNOSIS — E039 Hypothyroidism, unspecified: Secondary | ICD-10-CM | POA: Diagnosis not present

## 2021-01-14 DIAGNOSIS — I509 Heart failure, unspecified: Secondary | ICD-10-CM | POA: Diagnosis not present

## 2021-01-14 DIAGNOSIS — G9341 Metabolic encephalopathy: Secondary | ICD-10-CM | POA: Diagnosis not present

## 2021-01-14 DIAGNOSIS — K449 Diaphragmatic hernia without obstruction or gangrene: Secondary | ICD-10-CM | POA: Diagnosis not present

## 2021-01-14 DIAGNOSIS — E876 Hypokalemia: Secondary | ICD-10-CM | POA: Diagnosis not present

## 2021-01-14 DIAGNOSIS — I110 Hypertensive heart disease with heart failure: Secondary | ICD-10-CM | POA: Diagnosis not present

## 2021-01-14 DIAGNOSIS — J441 Chronic obstructive pulmonary disease with (acute) exacerbation: Secondary | ICD-10-CM | POA: Diagnosis not present

## 2021-01-14 DIAGNOSIS — N179 Acute kidney failure, unspecified: Secondary | ICD-10-CM | POA: Diagnosis not present

## 2021-01-14 DIAGNOSIS — D638 Anemia in other chronic diseases classified elsewhere: Secondary | ICD-10-CM | POA: Diagnosis not present

## 2021-01-14 DIAGNOSIS — Z8619 Personal history of other infectious and parasitic diseases: Secondary | ICD-10-CM | POA: Diagnosis not present

## 2021-01-14 DIAGNOSIS — J9611 Chronic respiratory failure with hypoxia: Secondary | ICD-10-CM | POA: Diagnosis not present

## 2021-01-14 DIAGNOSIS — M069 Rheumatoid arthritis, unspecified: Secondary | ICD-10-CM | POA: Diagnosis not present

## 2021-01-14 DIAGNOSIS — I700 Atherosclerosis of aorta: Secondary | ICD-10-CM | POA: Diagnosis not present

## 2021-01-14 DIAGNOSIS — Z7951 Long term (current) use of inhaled steroids: Secondary | ICD-10-CM | POA: Diagnosis not present

## 2021-01-14 DIAGNOSIS — E119 Type 2 diabetes mellitus without complications: Secondary | ICD-10-CM | POA: Diagnosis not present

## 2021-01-14 DIAGNOSIS — E7800 Pure hypercholesterolemia, unspecified: Secondary | ICD-10-CM | POA: Diagnosis not present

## 2021-01-15 DIAGNOSIS — D509 Iron deficiency anemia, unspecified: Secondary | ICD-10-CM | POA: Diagnosis not present

## 2021-01-15 DIAGNOSIS — Z23 Encounter for immunization: Secondary | ICD-10-CM | POA: Diagnosis not present

## 2021-01-15 DIAGNOSIS — R0902 Hypoxemia: Secondary | ICD-10-CM | POA: Diagnosis not present

## 2021-01-15 DIAGNOSIS — E1159 Type 2 diabetes mellitus with other circulatory complications: Secondary | ICD-10-CM | POA: Diagnosis not present

## 2021-01-15 DIAGNOSIS — I152 Hypertension secondary to endocrine disorders: Secondary | ICD-10-CM | POA: Diagnosis not present

## 2021-01-15 DIAGNOSIS — J449 Chronic obstructive pulmonary disease, unspecified: Secondary | ICD-10-CM | POA: Diagnosis not present

## 2021-01-19 DIAGNOSIS — Z7951 Long term (current) use of inhaled steroids: Secondary | ICD-10-CM | POA: Diagnosis not present

## 2021-01-19 DIAGNOSIS — N179 Acute kidney failure, unspecified: Secondary | ICD-10-CM | POA: Diagnosis not present

## 2021-01-19 DIAGNOSIS — Z8619 Personal history of other infectious and parasitic diseases: Secondary | ICD-10-CM | POA: Diagnosis not present

## 2021-01-19 DIAGNOSIS — I509 Heart failure, unspecified: Secondary | ICD-10-CM | POA: Diagnosis not present

## 2021-01-19 DIAGNOSIS — G9341 Metabolic encephalopathy: Secondary | ICD-10-CM | POA: Diagnosis not present

## 2021-01-19 DIAGNOSIS — I110 Hypertensive heart disease with heart failure: Secondary | ICD-10-CM | POA: Diagnosis not present

## 2021-01-19 DIAGNOSIS — K219 Gastro-esophageal reflux disease without esophagitis: Secondary | ICD-10-CM | POA: Diagnosis not present

## 2021-01-19 DIAGNOSIS — E039 Hypothyroidism, unspecified: Secondary | ICD-10-CM | POA: Diagnosis not present

## 2021-01-19 DIAGNOSIS — K449 Diaphragmatic hernia without obstruction or gangrene: Secondary | ICD-10-CM | POA: Diagnosis not present

## 2021-01-19 DIAGNOSIS — J441 Chronic obstructive pulmonary disease with (acute) exacerbation: Secondary | ICD-10-CM | POA: Diagnosis not present

## 2021-01-19 DIAGNOSIS — E7800 Pure hypercholesterolemia, unspecified: Secondary | ICD-10-CM | POA: Diagnosis not present

## 2021-01-19 DIAGNOSIS — U071 COVID-19: Secondary | ICD-10-CM | POA: Diagnosis not present

## 2021-01-19 DIAGNOSIS — E876 Hypokalemia: Secondary | ICD-10-CM | POA: Diagnosis not present

## 2021-01-19 DIAGNOSIS — G301 Alzheimer's disease with late onset: Secondary | ICD-10-CM | POA: Diagnosis not present

## 2021-01-19 DIAGNOSIS — Z87440 Personal history of urinary (tract) infections: Secondary | ICD-10-CM | POA: Diagnosis not present

## 2021-01-19 DIAGNOSIS — E119 Type 2 diabetes mellitus without complications: Secondary | ICD-10-CM | POA: Diagnosis not present

## 2021-01-19 DIAGNOSIS — I700 Atherosclerosis of aorta: Secondary | ICD-10-CM | POA: Diagnosis not present

## 2021-01-19 DIAGNOSIS — M069 Rheumatoid arthritis, unspecified: Secondary | ICD-10-CM | POA: Diagnosis not present

## 2021-01-19 DIAGNOSIS — D638 Anemia in other chronic diseases classified elsewhere: Secondary | ICD-10-CM | POA: Diagnosis not present

## 2021-01-19 DIAGNOSIS — J9611 Chronic respiratory failure with hypoxia: Secondary | ICD-10-CM | POA: Diagnosis not present

## 2021-01-20 DIAGNOSIS — Z8619 Personal history of other infectious and parasitic diseases: Secondary | ICD-10-CM | POA: Diagnosis not present

## 2021-01-20 DIAGNOSIS — G301 Alzheimer's disease with late onset: Secondary | ICD-10-CM | POA: Diagnosis not present

## 2021-01-20 DIAGNOSIS — J441 Chronic obstructive pulmonary disease with (acute) exacerbation: Secondary | ICD-10-CM | POA: Diagnosis not present

## 2021-01-20 DIAGNOSIS — K219 Gastro-esophageal reflux disease without esophagitis: Secondary | ICD-10-CM | POA: Diagnosis not present

## 2021-01-20 DIAGNOSIS — U071 COVID-19: Secondary | ICD-10-CM | POA: Diagnosis not present

## 2021-01-20 DIAGNOSIS — I509 Heart failure, unspecified: Secondary | ICD-10-CM | POA: Diagnosis not present

## 2021-01-20 DIAGNOSIS — G9341 Metabolic encephalopathy: Secondary | ICD-10-CM | POA: Diagnosis not present

## 2021-01-20 DIAGNOSIS — I110 Hypertensive heart disease with heart failure: Secondary | ICD-10-CM | POA: Diagnosis not present

## 2021-01-20 DIAGNOSIS — E119 Type 2 diabetes mellitus without complications: Secondary | ICD-10-CM | POA: Diagnosis not present

## 2021-01-20 DIAGNOSIS — I700 Atherosclerosis of aorta: Secondary | ICD-10-CM | POA: Diagnosis not present

## 2021-01-20 DIAGNOSIS — D638 Anemia in other chronic diseases classified elsewhere: Secondary | ICD-10-CM | POA: Diagnosis not present

## 2021-01-20 DIAGNOSIS — N179 Acute kidney failure, unspecified: Secondary | ICD-10-CM | POA: Diagnosis not present

## 2021-01-20 DIAGNOSIS — Z87440 Personal history of urinary (tract) infections: Secondary | ICD-10-CM | POA: Diagnosis not present

## 2021-01-20 DIAGNOSIS — M069 Rheumatoid arthritis, unspecified: Secondary | ICD-10-CM | POA: Diagnosis not present

## 2021-01-20 DIAGNOSIS — K449 Diaphragmatic hernia without obstruction or gangrene: Secondary | ICD-10-CM | POA: Diagnosis not present

## 2021-01-20 DIAGNOSIS — E876 Hypokalemia: Secondary | ICD-10-CM | POA: Diagnosis not present

## 2021-01-20 DIAGNOSIS — E039 Hypothyroidism, unspecified: Secondary | ICD-10-CM | POA: Diagnosis not present

## 2021-01-20 DIAGNOSIS — J9611 Chronic respiratory failure with hypoxia: Secondary | ICD-10-CM | POA: Diagnosis not present

## 2021-01-20 DIAGNOSIS — E7800 Pure hypercholesterolemia, unspecified: Secondary | ICD-10-CM | POA: Diagnosis not present

## 2021-01-20 DIAGNOSIS — Z7951 Long term (current) use of inhaled steroids: Secondary | ICD-10-CM | POA: Diagnosis not present

## 2021-01-21 DIAGNOSIS — I700 Atherosclerosis of aorta: Secondary | ICD-10-CM | POA: Diagnosis not present

## 2021-01-21 DIAGNOSIS — J9611 Chronic respiratory failure with hypoxia: Secondary | ICD-10-CM | POA: Diagnosis not present

## 2021-01-21 DIAGNOSIS — J441 Chronic obstructive pulmonary disease with (acute) exacerbation: Secondary | ICD-10-CM | POA: Diagnosis not present

## 2021-01-21 DIAGNOSIS — U071 COVID-19: Secondary | ICD-10-CM | POA: Diagnosis not present

## 2021-01-21 DIAGNOSIS — G301 Alzheimer's disease with late onset: Secondary | ICD-10-CM | POA: Diagnosis not present

## 2021-01-21 DIAGNOSIS — E039 Hypothyroidism, unspecified: Secondary | ICD-10-CM | POA: Diagnosis not present

## 2021-01-21 DIAGNOSIS — N179 Acute kidney failure, unspecified: Secondary | ICD-10-CM | POA: Diagnosis not present

## 2021-01-21 DIAGNOSIS — K219 Gastro-esophageal reflux disease without esophagitis: Secondary | ICD-10-CM | POA: Diagnosis not present

## 2021-01-21 DIAGNOSIS — I110 Hypertensive heart disease with heart failure: Secondary | ICD-10-CM | POA: Diagnosis not present

## 2021-01-21 DIAGNOSIS — M069 Rheumatoid arthritis, unspecified: Secondary | ICD-10-CM | POA: Diagnosis not present

## 2021-01-21 DIAGNOSIS — D638 Anemia in other chronic diseases classified elsewhere: Secondary | ICD-10-CM | POA: Diagnosis not present

## 2021-01-21 DIAGNOSIS — E119 Type 2 diabetes mellitus without complications: Secondary | ICD-10-CM | POA: Diagnosis not present

## 2021-01-21 DIAGNOSIS — Z7951 Long term (current) use of inhaled steroids: Secondary | ICD-10-CM | POA: Diagnosis not present

## 2021-01-21 DIAGNOSIS — E7800 Pure hypercholesterolemia, unspecified: Secondary | ICD-10-CM | POA: Diagnosis not present

## 2021-01-21 DIAGNOSIS — E1159 Type 2 diabetes mellitus with other circulatory complications: Secondary | ICD-10-CM | POA: Diagnosis not present

## 2021-01-21 DIAGNOSIS — K449 Diaphragmatic hernia without obstruction or gangrene: Secondary | ICD-10-CM | POA: Diagnosis not present

## 2021-01-21 DIAGNOSIS — E876 Hypokalemia: Secondary | ICD-10-CM | POA: Diagnosis not present

## 2021-01-21 DIAGNOSIS — Z87440 Personal history of urinary (tract) infections: Secondary | ICD-10-CM | POA: Diagnosis not present

## 2021-01-21 DIAGNOSIS — G9341 Metabolic encephalopathy: Secondary | ICD-10-CM | POA: Diagnosis not present

## 2021-01-21 DIAGNOSIS — Z8619 Personal history of other infectious and parasitic diseases: Secondary | ICD-10-CM | POA: Diagnosis not present

## 2021-01-21 DIAGNOSIS — I509 Heart failure, unspecified: Secondary | ICD-10-CM | POA: Diagnosis not present

## 2021-01-24 DIAGNOSIS — J9601 Acute respiratory failure with hypoxia: Secondary | ICD-10-CM | POA: Diagnosis not present

## 2021-01-24 DIAGNOSIS — J441 Chronic obstructive pulmonary disease with (acute) exacerbation: Secondary | ICD-10-CM | POA: Diagnosis not present

## 2021-01-24 DIAGNOSIS — J9811 Atelectasis: Secondary | ICD-10-CM | POA: Diagnosis not present

## 2021-01-24 DIAGNOSIS — E785 Hyperlipidemia, unspecified: Secondary | ICD-10-CM | POA: Diagnosis not present

## 2021-01-24 DIAGNOSIS — J9621 Acute and chronic respiratory failure with hypoxia: Secondary | ICD-10-CM | POA: Diagnosis not present

## 2021-01-24 DIAGNOSIS — R591 Generalized enlarged lymph nodes: Secondary | ICD-10-CM | POA: Diagnosis not present

## 2021-01-24 DIAGNOSIS — I5032 Chronic diastolic (congestive) heart failure: Secondary | ICD-10-CM | POA: Diagnosis not present

## 2021-01-24 DIAGNOSIS — K219 Gastro-esophageal reflux disease without esophagitis: Secondary | ICD-10-CM | POA: Diagnosis not present

## 2021-01-24 DIAGNOSIS — I110 Hypertensive heart disease with heart failure: Secondary | ICD-10-CM | POA: Diagnosis not present

## 2021-01-24 DIAGNOSIS — R000 Tachycardia, unspecified: Secondary | ICD-10-CM | POA: Diagnosis not present

## 2021-01-24 DIAGNOSIS — K449 Diaphragmatic hernia without obstruction or gangrene: Secondary | ICD-10-CM | POA: Diagnosis not present

## 2021-01-24 DIAGNOSIS — J449 Chronic obstructive pulmonary disease, unspecified: Secondary | ICD-10-CM | POA: Diagnosis not present

## 2021-01-24 DIAGNOSIS — E119 Type 2 diabetes mellitus without complications: Secondary | ICD-10-CM | POA: Diagnosis not present

## 2021-01-24 DIAGNOSIS — R531 Weakness: Secondary | ICD-10-CM | POA: Diagnosis not present

## 2021-01-24 DIAGNOSIS — R509 Fever, unspecified: Secondary | ICD-10-CM | POA: Diagnosis not present

## 2021-01-24 DIAGNOSIS — R0602 Shortness of breath: Secondary | ICD-10-CM | POA: Diagnosis not present

## 2021-01-24 DIAGNOSIS — R0902 Hypoxemia: Secondary | ICD-10-CM | POA: Diagnosis not present

## 2021-01-24 DIAGNOSIS — E876 Hypokalemia: Secondary | ICD-10-CM | POA: Diagnosis not present

## 2021-01-24 DIAGNOSIS — I499 Cardiac arrhythmia, unspecified: Secondary | ICD-10-CM | POA: Diagnosis not present

## 2021-01-24 DIAGNOSIS — R062 Wheezing: Secondary | ICD-10-CM | POA: Diagnosis not present

## 2021-01-24 DIAGNOSIS — Z8616 Personal history of COVID-19: Secondary | ICD-10-CM | POA: Diagnosis not present

## 2021-01-24 DIAGNOSIS — Z87891 Personal history of nicotine dependence: Secondary | ICD-10-CM | POA: Diagnosis not present

## 2021-01-24 DIAGNOSIS — J440 Chronic obstructive pulmonary disease with acute lower respiratory infection: Secondary | ICD-10-CM | POA: Diagnosis not present

## 2021-01-24 DIAGNOSIS — Z743 Need for continuous supervision: Secondary | ICD-10-CM | POA: Diagnosis not present

## 2021-01-24 DIAGNOSIS — E039 Hypothyroidism, unspecified: Secondary | ICD-10-CM | POA: Diagnosis not present

## 2021-01-24 DIAGNOSIS — E860 Dehydration: Secondary | ICD-10-CM | POA: Diagnosis not present

## 2021-01-24 DIAGNOSIS — T380X5A Adverse effect of glucocorticoids and synthetic analogues, initial encounter: Secondary | ICD-10-CM | POA: Diagnosis not present

## 2021-01-24 DIAGNOSIS — Z20822 Contact with and (suspected) exposure to covid-19: Secondary | ICD-10-CM | POA: Diagnosis not present

## 2021-01-24 DIAGNOSIS — E871 Hypo-osmolality and hyponatremia: Secondary | ICD-10-CM | POA: Diagnosis not present

## 2021-01-24 DIAGNOSIS — R918 Other nonspecific abnormal finding of lung field: Secondary | ICD-10-CM | POA: Diagnosis not present

## 2021-01-24 DIAGNOSIS — M069 Rheumatoid arthritis, unspecified: Secondary | ICD-10-CM | POA: Diagnosis not present

## 2021-01-25 DIAGNOSIS — E876 Hypokalemia: Secondary | ICD-10-CM | POA: Diagnosis not present

## 2021-01-25 DIAGNOSIS — R591 Generalized enlarged lymph nodes: Secondary | ICD-10-CM | POA: Diagnosis not present

## 2021-01-25 DIAGNOSIS — R000 Tachycardia, unspecified: Secondary | ICD-10-CM | POA: Diagnosis not present

## 2021-01-25 DIAGNOSIS — J441 Chronic obstructive pulmonary disease with (acute) exacerbation: Secondary | ICD-10-CM | POA: Diagnosis not present

## 2021-01-25 DIAGNOSIS — R918 Other nonspecific abnormal finding of lung field: Secondary | ICD-10-CM | POA: Diagnosis not present

## 2021-01-25 DIAGNOSIS — R0902 Hypoxemia: Secondary | ICD-10-CM | POA: Diagnosis not present

## 2021-01-25 DIAGNOSIS — R0602 Shortness of breath: Secondary | ICD-10-CM | POA: Diagnosis not present

## 2021-01-25 DIAGNOSIS — E119 Type 2 diabetes mellitus without complications: Secondary | ICD-10-CM | POA: Diagnosis not present

## 2021-01-25 DIAGNOSIS — J9601 Acute respiratory failure with hypoxia: Secondary | ICD-10-CM | POA: Diagnosis not present

## 2021-01-26 DIAGNOSIS — R000 Tachycardia, unspecified: Secondary | ICD-10-CM | POA: Diagnosis not present

## 2021-01-26 DIAGNOSIS — J9601 Acute respiratory failure with hypoxia: Secondary | ICD-10-CM | POA: Diagnosis not present

## 2021-01-26 DIAGNOSIS — J441 Chronic obstructive pulmonary disease with (acute) exacerbation: Secondary | ICD-10-CM | POA: Diagnosis not present

## 2021-01-26 DIAGNOSIS — E876 Hypokalemia: Secondary | ICD-10-CM | POA: Diagnosis not present

## 2021-01-27 DIAGNOSIS — E876 Hypokalemia: Secondary | ICD-10-CM | POA: Diagnosis not present

## 2021-01-27 DIAGNOSIS — R0902 Hypoxemia: Secondary | ICD-10-CM | POA: Diagnosis not present

## 2021-01-27 DIAGNOSIS — R000 Tachycardia, unspecified: Secondary | ICD-10-CM | POA: Diagnosis not present

## 2021-01-27 DIAGNOSIS — J441 Chronic obstructive pulmonary disease with (acute) exacerbation: Secondary | ICD-10-CM | POA: Diagnosis not present

## 2021-01-27 DIAGNOSIS — J9601 Acute respiratory failure with hypoxia: Secondary | ICD-10-CM | POA: Diagnosis not present

## 2021-01-28 DIAGNOSIS — J9601 Acute respiratory failure with hypoxia: Secondary | ICD-10-CM | POA: Diagnosis not present

## 2021-01-28 DIAGNOSIS — R000 Tachycardia, unspecified: Secondary | ICD-10-CM | POA: Diagnosis not present

## 2021-01-28 DIAGNOSIS — E876 Hypokalemia: Secondary | ICD-10-CM | POA: Diagnosis not present

## 2021-01-28 DIAGNOSIS — J441 Chronic obstructive pulmonary disease with (acute) exacerbation: Secondary | ICD-10-CM | POA: Diagnosis not present

## 2021-01-30 DIAGNOSIS — J449 Chronic obstructive pulmonary disease, unspecified: Secondary | ICD-10-CM | POA: Diagnosis not present

## 2021-01-30 DIAGNOSIS — J441 Chronic obstructive pulmonary disease with (acute) exacerbation: Secondary | ICD-10-CM | POA: Diagnosis not present

## 2021-01-30 DIAGNOSIS — R0902 Hypoxemia: Secondary | ICD-10-CM | POA: Diagnosis not present

## 2021-01-31 DIAGNOSIS — Z9981 Dependence on supplemental oxygen: Secondary | ICD-10-CM | POA: Diagnosis not present

## 2021-01-31 DIAGNOSIS — E039 Hypothyroidism, unspecified: Secondary | ICD-10-CM | POA: Diagnosis not present

## 2021-01-31 DIAGNOSIS — I499 Cardiac arrhythmia, unspecified: Secondary | ICD-10-CM | POA: Diagnosis not present

## 2021-01-31 DIAGNOSIS — U099 Post covid-19 condition, unspecified: Secondary | ICD-10-CM | POA: Diagnosis not present

## 2021-01-31 DIAGNOSIS — T380X5A Adverse effect of glucocorticoids and synthetic analogues, initial encounter: Secondary | ICD-10-CM | POA: Diagnosis not present

## 2021-01-31 DIAGNOSIS — Z79899 Other long term (current) drug therapy: Secondary | ICD-10-CM | POA: Diagnosis not present

## 2021-01-31 DIAGNOSIS — R0902 Hypoxemia: Secondary | ICD-10-CM | POA: Diagnosis not present

## 2021-01-31 DIAGNOSIS — Z7951 Long term (current) use of inhaled steroids: Secondary | ICD-10-CM | POA: Diagnosis not present

## 2021-01-31 DIAGNOSIS — R0602 Shortness of breath: Secondary | ICD-10-CM | POA: Diagnosis not present

## 2021-01-31 DIAGNOSIS — J441 Chronic obstructive pulmonary disease with (acute) exacerbation: Secondary | ICD-10-CM | POA: Diagnosis not present

## 2021-01-31 DIAGNOSIS — J9621 Acute and chronic respiratory failure with hypoxia: Secondary | ICD-10-CM | POA: Diagnosis not present

## 2021-01-31 DIAGNOSIS — E119 Type 2 diabetes mellitus without complications: Secondary | ICD-10-CM | POA: Diagnosis not present

## 2021-01-31 DIAGNOSIS — Z743 Need for continuous supervision: Secondary | ICD-10-CM | POA: Diagnosis not present

## 2021-01-31 DIAGNOSIS — R627 Adult failure to thrive: Secondary | ICD-10-CM | POA: Diagnosis not present

## 2021-01-31 DIAGNOSIS — R531 Weakness: Secondary | ICD-10-CM | POA: Diagnosis not present

## 2021-01-31 DIAGNOSIS — B957 Other staphylococcus as the cause of diseases classified elsewhere: Secondary | ICD-10-CM | POA: Diagnosis not present

## 2021-01-31 DIAGNOSIS — D649 Anemia, unspecified: Secondary | ICD-10-CM | POA: Diagnosis not present

## 2021-01-31 DIAGNOSIS — J449 Chronic obstructive pulmonary disease, unspecified: Secondary | ICD-10-CM | POA: Diagnosis not present

## 2021-01-31 DIAGNOSIS — Z87891 Personal history of nicotine dependence: Secondary | ICD-10-CM | POA: Diagnosis not present

## 2021-01-31 DIAGNOSIS — R059 Cough, unspecified: Secondary | ICD-10-CM | POA: Diagnosis not present

## 2021-01-31 DIAGNOSIS — I110 Hypertensive heart disease with heart failure: Secondary | ICD-10-CM | POA: Diagnosis not present

## 2021-01-31 DIAGNOSIS — R739 Hyperglycemia, unspecified: Secondary | ICD-10-CM | POA: Diagnosis not present

## 2021-01-31 DIAGNOSIS — Z20822 Contact with and (suspected) exposure to covid-19: Secondary | ICD-10-CM | POA: Diagnosis not present

## 2021-01-31 DIAGNOSIS — J440 Chronic obstructive pulmonary disease with acute lower respiratory infection: Secondary | ICD-10-CM | POA: Diagnosis not present

## 2021-01-31 DIAGNOSIS — I5032 Chronic diastolic (congestive) heart failure: Secondary | ICD-10-CM | POA: Diagnosis not present

## 2021-01-31 DIAGNOSIS — R7989 Other specified abnormal findings of blood chemistry: Secondary | ICD-10-CM | POA: Diagnosis not present

## 2021-01-31 DIAGNOSIS — M069 Rheumatoid arthritis, unspecified: Secondary | ICD-10-CM | POA: Diagnosis not present

## 2021-01-31 DIAGNOSIS — Z6824 Body mass index (BMI) 24.0-24.9, adult: Secondary | ICD-10-CM | POA: Diagnosis not present

## 2021-01-31 DIAGNOSIS — J9600 Acute respiratory failure, unspecified whether with hypoxia or hypercapnia: Secondary | ICD-10-CM | POA: Diagnosis not present

## 2021-01-31 DIAGNOSIS — E876 Hypokalemia: Secondary | ICD-10-CM | POA: Diagnosis not present

## 2021-01-31 DIAGNOSIS — I10 Essential (primary) hypertension: Secondary | ICD-10-CM | POA: Diagnosis not present

## 2021-01-31 DIAGNOSIS — E1165 Type 2 diabetes mellitus with hyperglycemia: Secondary | ICD-10-CM | POA: Diagnosis not present

## 2021-02-01 DIAGNOSIS — R0902 Hypoxemia: Secondary | ICD-10-CM | POA: Diagnosis not present

## 2021-02-10 DIAGNOSIS — J449 Chronic obstructive pulmonary disease, unspecified: Secondary | ICD-10-CM | POA: Diagnosis not present

## 2021-02-10 DIAGNOSIS — J9611 Chronic respiratory failure with hypoxia: Secondary | ICD-10-CM | POA: Diagnosis not present

## 2021-02-10 DIAGNOSIS — I5030 Unspecified diastolic (congestive) heart failure: Secondary | ICD-10-CM | POA: Diagnosis not present

## 2021-02-10 DIAGNOSIS — Z7689 Persons encountering health services in other specified circumstances: Secondary | ICD-10-CM | POA: Diagnosis not present

## 2021-02-23 DIAGNOSIS — J449 Chronic obstructive pulmonary disease, unspecified: Secondary | ICD-10-CM | POA: Diagnosis not present

## 2021-02-23 DIAGNOSIS — I5030 Unspecified diastolic (congestive) heart failure: Secondary | ICD-10-CM | POA: Diagnosis not present

## 2021-02-23 DIAGNOSIS — J9611 Chronic respiratory failure with hypoxia: Secondary | ICD-10-CM | POA: Diagnosis not present

## 2021-02-26 DIAGNOSIS — L309 Dermatitis, unspecified: Secondary | ICD-10-CM | POA: Diagnosis not present

## 2021-02-26 DIAGNOSIS — Z0000 Encounter for general adult medical examination without abnormal findings: Secondary | ICD-10-CM | POA: Diagnosis not present

## 2021-02-26 DIAGNOSIS — E039 Hypothyroidism, unspecified: Secondary | ICD-10-CM | POA: Diagnosis not present

## 2021-02-26 DIAGNOSIS — Z139 Encounter for screening, unspecified: Secondary | ICD-10-CM | POA: Diagnosis not present

## 2021-02-26 DIAGNOSIS — E876 Hypokalemia: Secondary | ICD-10-CM | POA: Diagnosis not present

## 2021-02-26 DIAGNOSIS — J449 Chronic obstructive pulmonary disease, unspecified: Secondary | ICD-10-CM | POA: Diagnosis not present

## 2021-02-26 DIAGNOSIS — Z136 Encounter for screening for cardiovascular disorders: Secondary | ICD-10-CM | POA: Diagnosis not present

## 2021-03-01 DIAGNOSIS — R0902 Hypoxemia: Secondary | ICD-10-CM | POA: Diagnosis not present

## 2021-03-01 DIAGNOSIS — J441 Chronic obstructive pulmonary disease with (acute) exacerbation: Secondary | ICD-10-CM | POA: Diagnosis not present

## 2021-03-04 DIAGNOSIS — J449 Chronic obstructive pulmonary disease, unspecified: Secondary | ICD-10-CM | POA: Diagnosis not present

## 2021-03-18 DIAGNOSIS — J020 Streptococcal pharyngitis: Secondary | ICD-10-CM | POA: Diagnosis not present

## 2021-03-18 DIAGNOSIS — Z20822 Contact with and (suspected) exposure to covid-19: Secondary | ICD-10-CM | POA: Diagnosis not present

## 2021-03-18 DIAGNOSIS — R059 Cough, unspecified: Secondary | ICD-10-CM | POA: Diagnosis not present

## 2021-03-26 DIAGNOSIS — J9611 Chronic respiratory failure with hypoxia: Secondary | ICD-10-CM | POA: Diagnosis not present

## 2021-03-26 DIAGNOSIS — I5030 Unspecified diastolic (congestive) heart failure: Secondary | ICD-10-CM | POA: Diagnosis not present

## 2021-03-26 DIAGNOSIS — J449 Chronic obstructive pulmonary disease, unspecified: Secondary | ICD-10-CM | POA: Diagnosis not present

## 2021-03-30 DIAGNOSIS — J449 Chronic obstructive pulmonary disease, unspecified: Secondary | ICD-10-CM | POA: Diagnosis not present

## 2021-04-01 DIAGNOSIS — R0902 Hypoxemia: Secondary | ICD-10-CM | POA: Diagnosis not present

## 2021-04-01 DIAGNOSIS — J441 Chronic obstructive pulmonary disease with (acute) exacerbation: Secondary | ICD-10-CM | POA: Diagnosis not present

## 2021-04-08 DIAGNOSIS — E1169 Type 2 diabetes mellitus with other specified complication: Secondary | ICD-10-CM | POA: Diagnosis not present

## 2021-04-08 DIAGNOSIS — E039 Hypothyroidism, unspecified: Secondary | ICD-10-CM | POA: Diagnosis not present

## 2021-04-20 DIAGNOSIS — J449 Chronic obstructive pulmonary disease, unspecified: Secondary | ICD-10-CM | POA: Diagnosis not present

## 2021-04-23 NOTE — Patient Outreach (Signed)
[facility]) Care Management  04/23/2021  Karla Clark 03/02/1946 [id number]   MD referral received from [facility] family practice for care management / social work as family needs assistance on pt care. Assigned to [name] RN for Care management for follow up.   [name] [facility] Care Management Assistant

## 2021-04-24 ENCOUNTER — Other Ambulatory Visit: Payer: Self-pay | Admitting: *Deleted

## 2021-04-24 DIAGNOSIS — R059 Cough, unspecified: Secondary | ICD-10-CM | POA: Diagnosis not present

## 2021-04-24 DIAGNOSIS — J9811 Atelectasis: Secondary | ICD-10-CM | POA: Diagnosis not present

## 2021-04-24 DIAGNOSIS — R0902 Hypoxemia: Secondary | ICD-10-CM | POA: Diagnosis not present

## 2021-04-24 DIAGNOSIS — J209 Acute bronchitis, unspecified: Secondary | ICD-10-CM | POA: Diagnosis not present

## 2021-04-24 DIAGNOSIS — Z20822 Contact with and (suspected) exposure to covid-19: Secondary | ICD-10-CM | POA: Diagnosis not present

## 2021-04-24 DIAGNOSIS — J440 Chronic obstructive pulmonary disease with acute lower respiratory infection: Secondary | ICD-10-CM | POA: Diagnosis not present

## 2021-04-24 DIAGNOSIS — K449 Diaphragmatic hernia without obstruction or gangrene: Secondary | ICD-10-CM | POA: Diagnosis not present

## 2021-04-24 DIAGNOSIS — Z743 Need for continuous supervision: Secondary | ICD-10-CM | POA: Diagnosis not present

## 2021-04-24 DIAGNOSIS — R062 Wheezing: Secondary | ICD-10-CM | POA: Diagnosis not present

## 2021-04-24 DIAGNOSIS — R0602 Shortness of breath: Secondary | ICD-10-CM | POA: Diagnosis not present

## 2021-04-24 NOTE — Patient Outreach (Signed)
[facility]) Care Management  04/24/2021  Karla Clark [birth year] [id number]   MD Referral Received 04/23/2021 Outreach call: 04/24/2021  Telephone Screening/Assessment  RN spoke with the pt who permitted to speak with her daughter Karla Clark. Daughter states many issues with pt and her own illness preventing her from take care of the pt has she has done in the past. States the pt at one point was going to hospice as she thought the agency would come into the home 24/7  and care for the pt. RN explained otherwise based upon home with hospice. RN discussed the different levels of care related to hospice in home and the placement facilities. In addition to the traditional placement for SNF and/or long term placement (nursing home). Daughter states pt has Dementia and has a lot of arthritis where she is unable to ambulate at this time. Daughter has opt to decline any ongoing nursing needs for this RN case manager to address at this time however receptive to assistance with placement from [facility] social worker.   Further screening attempted and RN able to verified pt is taking all her prescribed medications as prescribed however cough up "green" sputum. States pt's provider is unaware. RN inquired further as daughter verified no fevers or GU/GI issues just the "green" sputum as this time. RN strongly encouraged daughter to contact the pt's provider office to intervene further. RN informed daughter this is importance to further prevent further infection and the pt's provider may would to intervene with antibiotics if necessary for any possible infections. Daughter indicated she would call Karla Clark office concerning this matter.   Based upon today's discussion RN reviewed all discussed related to addressing pt's needs. RN will make a referral to [facility] social worker for placement sources and work with pt's provided to complete the FL2 form however strongly encouraged the daughter to  contact the facility of choice ([facility]) and further inquired on the admission criteria for this pt. Along with pt's daughter contacted the pt's provider concerning pt's "green" sputum.  Based upon the request to opt out for nurse issues RN will exit and allow the [facility] social worker to intervene further.   Addendum: RN received a call today from the pt's providers office inquired on the conversation between this RN case manager and the pt's daughter Karla Clark) concerning pt's symptoms and the education provided concerning what pt/daughter should do by contacting her provider. All information confirmed and RN has informed the provider's office the the daughter has opt to decline nursing for program enrollment however receptive to a Child psychotherapist.  Case will be closed on behalf of community care management.  Karla Cousin, RN Care Management Coordinator [facility] Main Office [phone]

## 2021-04-24 NOTE — Patient Outreach (Signed)
[facility]) Care Management  04/24/2021  Karla Clark Jun 28, 1945 [id number]  Referral received from [name], RN  for Care Management services for placement for nursing home.  Assigned to Reece Levy, LCSW for Care Coordinator.

## 2021-04-25 DIAGNOSIS — J449 Chronic obstructive pulmonary disease, unspecified: Secondary | ICD-10-CM | POA: Diagnosis not present

## 2021-04-25 DIAGNOSIS — J9611 Chronic respiratory failure with hypoxia: Secondary | ICD-10-CM | POA: Diagnosis not present

## 2021-04-25 DIAGNOSIS — I5030 Unspecified diastolic (congestive) heart failure: Secondary | ICD-10-CM | POA: Diagnosis not present

## 2021-05-01 DIAGNOSIS — R0902 Hypoxemia: Secondary | ICD-10-CM | POA: Diagnosis not present

## 2021-05-01 DIAGNOSIS — J441 Chronic obstructive pulmonary disease with (acute) exacerbation: Secondary | ICD-10-CM | POA: Diagnosis not present

## 2021-05-13 ENCOUNTER — Telehealth: Payer: Self-pay | Admitting: *Deleted

## 2021-05-13 NOTE — Patient Outreach (Signed)
[facility]) Care Management  05/13/2021  Joaquin CourtsMearion T Hallam 04-Aug-1945 [id number]  CSW attempted to reach pt/family today for initial outreach call without success. No voicemail set up.  CSW will attempt again in the next 3-4 business days.   Reece LevyJanet Branch Pacitti, MSW, LCSW Clinical Social Worker  [facility] [phone]

## 2021-05-19 ENCOUNTER — Telehealth: Payer: Self-pay | Admitting: *Deleted

## 2021-05-19 NOTE — Patient Outreach (Signed)
[facility]) Care Management  05/19/2021  Joaquin CourtsMearion T Tatsch 1946/01/22 [id number]   CSW made an attempt to reach pt/family for initial assessment and was unsuccessful- no voice mail set up.  This CSW will advise assigned CSW who will attempt another outreach per protocol.    Reece LevyJanet Arrow Tomko, MSW, LCSW Clinical Social Worker  [facility]

## 2021-05-25 ENCOUNTER — Encounter: Payer: Self-pay | Admitting: *Deleted

## 2021-05-25 ENCOUNTER — Other Ambulatory Visit: Payer: Self-pay | Admitting: *Deleted

## 2021-05-25 NOTE — Patient Outreach (Signed)
Care Management Clinical Social Work Note  05/25/2021 Name: Joaquin CourtsMearion T Sek MRN: [id number] DOB: 03/27/1946  Joaquin CourtsMearion T Dolinar is a 76 y.o. year old female who is a primary care patient of Charlott RakesHodges, [place], MD.  The Care Management team was consulted for assistance with chronic disease management and coordination needs.  Engaged with patient's daughter by telephone for initial visit in response to provider referral for social work chronic care management and care coordination services  Consent to Services:  Ms. Mary SellaCockman was given information about Care Management services today including:  Care Management services includes personalized support from designated clinical staff supervised by her physician, including individualized plan of care and coordination with other care providers 24/7 contact phone numbers for assistance for urgent and routine care needs. The patient may stop case management services at any time by phone call to the office staff.  Patient agreed to services and consent obtained.   Assessment: Review of patient past medical history, allergies, medications, and health status, including review of relevant consultants reports was performed today as part of a comprehensive evaluation and provision of chronic care management and care coordination services.  SDOH (Social Determinants of Health) assessments and interventions performed:  SDOH Interventions    Flowsheet Row Most Recent Value  SDOH Interventions   Food Insecurity Interventions Intervention Not Indicated, Other (Comment)  [Verified by Daughter - Sheliah MendsLeisa Calvert]  Financial Strain Interventions Intervention Not Indicated, Other (Comment)  [Verified by Daughter Sheliah Mends- Leisa Mee]  Housing Interventions Intervention Not Indicated, Other (Comment)  [Verified by Daughter - Sheliah MendsLeisa Adinolfi]  Intimate Partner Violence Interventions Intervention Not Indicated, Other (Comment)  [Verified by Daughter Sheliah Mends- Leisa Marrow]  Physical  Activity Interventions Intervention Not Indicated, Other (Comments)  [Verified by Daughter Sheliah Mends- Leisa Brun]  Stress Interventions Intervention Not Indicated, Other (Comment)  [Verified by Daughter Sheliah Mends- Leisa Erbe]  Social Connections Interventions Intervention Not Indicated, Other (Comment)  [Verified by Daughter Sheliah Mends- Leisa Welke]  Transportation Interventions Intervention Not Indicated, Other (Comment)  [Verified by Daughter Sheliah Mends- Leisa Nicolaisen]        Advanced Directives Status: Not ready or willing to discuss.  Care Plan  Allergies  Allergen Reactions   Ace Inhibitors Cough    Outpatient Encounter Medications as of 05/25/2021  Medication Sig   albuterol (PROVENTIL) (2.5 MG/3ML) 0.083% nebulizer solution Take 2.5 mg by nebulization every 6 (six) hours as needed for wheezing or shortness of breath.   diazepam (VALIUM) 5 MG tablet Take 1 tablet (5 mg total) by mouth every 6 (six) hours as needed for muscle spasms.   ferrous sulfate 325 (65 FE) MG tablet Take 1 tablet (325 mg total) by mouth daily.   Fluticasone-Umeclidin-Vilant (TRELEGY ELLIPTA) 100-62.5-25 MCG/INH AEPB Inhale 1 puff into the lungs daily.   folic acid (FOLVITE) 1 MG tablet TAKE 1 TABLET BY MOUTH EVERY DAY (Patient taking differently: Take 1 mg by mouth daily. )   furosemide (LASIX) 40 MG tablet Take 1 tablet (40 mg total) by mouth daily.   gabapentin (NEURONTIN) 600 MG tablet Take 1,200 mg by mouth 2 (two) times daily.   HYDROcodone-acetaminophen (NORCO) 5-325 MG tablet Take 1 tablet by mouth every 4 (four) hours as needed for moderate pain or severe pain.   hydrOXYzine (ATARAX/VISTARIL) 10 MG tablet Take 10 mg by mouth 3 (three) times daily as needed.   ipratropium-albuterol (DUONEB) 0.5-2.5 (3) MG/3ML SOLN Take 3 mLs by nebulization every 6 (six) hours as needed (shortness of breath).   levothyroxine (SYNTHROID) 100 MCG tablet  Take 100 mcg by mouth daily.   levothyroxine (SYNTHROID) 75 MCG tablet Take 75 mcg by mouth daily.    losartan (COZAAR) 100 MG tablet Take 100 mg by mouth daily.   memantine (NAMENDA) 5 MG tablet Take 5 mg by mouth daily.   metFORMIN (GLUCOPHAGE-XR) 500 MG 24 hr tablet Take 500 mg by mouth 2 (two) times daily.    NYSTATIN powder Apply 1 g topically 2 (two) times daily as needed (rash).    pantoprazole (PROTONIX) 40 MG tablet Take 40 mg by mouth daily.   pioglitazone (ACTOS) 30 MG tablet Take 30 mg by mouth daily.    potassium chloride 20 MEQ TBCR Take 20 mEq by mouth daily.   predniSONE (DELTASONE) 10 MG tablet Take q day 6,5,4,3,2,1   simvastatin (ZOCOR) 40 MG tablet Take 40 mg by mouth daily at 6 PM.    venlafaxine XR (EFFEXOR-XR) 150 MG 24 hr capsule Take 150 mg by mouth every morning.   ziprasidone (GEODON) 20 MG capsule Take 20 mg by mouth every morning.   ziprasidone (GEODON) 40 MG capsule Take 40 mg by mouth at bedtime.    No facility-administered encounter medications on file as of 05/25/2021.    Patient Active Problem List   Diagnosis Date Noted   Acute respiratory failure with hypoxia (HCC) 05/10/2019   Sepsis due to pneumonia (HCC) 03/07/2019   Acute lower UTI 03/07/2019   Acute on chronic respiratory failure with hypoxia (HCC) 03/07/2019   COPD with acute exacerbation (HCC) 03/07/2019   Acute metabolic encephalopathy 03/07/2019   Transient hypotension 03/07/2019   AKI (acute kidney injury) (HCC) 03/07/2019   Rheumatoid arthritis with rheumatoid factor of multiple sites without organ or systems involvement (HCC) 07/19/2016   High risk medications (not anticoagulants) long-term use 07/19/2016   Osteoarthritis of both knees 07/19/2016   Osteoarthritis of both hands 07/19/2016   Primary osteoarthritis of both feet 07/19/2016    Conditions to be addressed/monitored: COPD and Dementia.  Level of Care Concerns, ADL/IADL Limitations, Family and Relationship Dysfunction, Limited Access to Caregiver, Cognitive Deficits, Memory Deficits, and Lacks Knowledge of [employer].  Care Plan : LCSW Plan of Care  Updates made by Karolee Stamps, LCSW since 05/25/2021 12:00 AM     Problem: Obtain Palliative Care Services to Keep Pain Under Control.   Priority: High     Goal: Obtain Palliative Care Services to Keep Pain Under Control.   Start Date: 05/25/2021  Expected End Date: 07/23/2021  This Visit's Progress: On track  Priority: High  Note:   Current Barriers: Chronic Care Management Needs in Patient with Chronic Obstructive Pulmonary Disease with Acute Exacerbation, Acute Respiratory Failure with Hypoxia, Acute Metabolic Encephalopathy, Acute Kidney Injury, and Dementia. Clinical Goals: Patient's daughter will work with LCSW to obtain palliative care services for patient in the home to help reduce and manage pain symptoms. Clinical Interventions: Assessed patient's previous treatment, needs, coping skills, current treatment, support system, and barriers to care. Solution-Focused Therapy Performed, Mindfulness Meditation Strategies, Relaxation Techniques and Deep Breathing Exercises Encouraged, Active Listening/ Reflection Utilized, Emotional Support Provided, Problem Solving /Task-Centered Solutions Developed, Brief Cognitive Behavioral Therapy Initiated, Mental Health Medications Reviewed and Compliance Discussed, Quality of Sleep Assessed and Sleep Hygiene Techniques Promoted, Support Group Participation Encouraged, Increase Level of Activity/Exercise Emphasized, and Verbalization of Feelings Explored.      Discussed plans with patient's daughter for ongoing care management follow-up and provided direct contact information for care management team. Discussed several options for long-term care,  based on need and insurance, and verbal consent obtained from patient's daughter to request order for palliative care services.   Collaboration with Primary Care Physician, Dr. Charlott Rakes regarding development and update of comprehensive plan of care as  evidenced by provider attestation and co-signature. Collaboration with Primary Care Physician, Dr. Charlott Rakes regarding order for palliative care services. Inter-disciplinary care team collaboration (see longitudinal plan of care). Discussed benefits of additional support referrals, such as spiritual advisor or chaplain, pain or palliative care specialist, hospice care, home health care, etc.  Discussed medication and equipment needs that may change with disease progression.  Explored personal and family values, beliefs and preferences about death and dying. Encouraged discussion of end-of-life care issues with others, including family, friends, and/or spiritual advisors, as needed.  Encouraged daughter to attend a dementia support group, check out dementia classes in the community, and check out dementia website, providing resource information. Referred to interdisciplinary palliative care team to provide consultation and direct patient care. Patient Goals/Self-Care Activities: Daughter will work with LCSW on a bi-weekly basis, until palliative care services are in place in the home. Daughter will hold a family meeting with siblings, nieces, and nephews, all currently residing in the home, to develop a schedule of who can assist, and when? Daughter will review list of long-term memory care assisted living facilities, for consideration of future placement, provided by LCSW on 05/25/2021. Daughter will review list of in-home care, respite care, and home health care agencies, provided to her by LCSW on 05/25/2021.    Daughter will contact LCSW directly (# [phone]) if she has questions, needs assistance, or if additional social work needs are identified between now and our next scheduled telephone outreach call. Follow-Up Plan:  LCSW will follow-up with patient's daughter on 06/08/2021 at 9:00 am.   Danford Bad, BSW, MSW, LCSW  Licensed Clinical Social Worker  [facility]  [address]ailing [place] [address], [place], [place] [address] Physical Address-[address], [place], [place] [address] Toll Free Main # [phone] Fax # [phone] Cell # [phone]  [email or web address]

## 2021-05-26 DIAGNOSIS — I5030 Unspecified diastolic (congestive) heart failure: Secondary | ICD-10-CM | POA: Diagnosis not present

## 2021-05-26 DIAGNOSIS — J449 Chronic obstructive pulmonary disease, unspecified: Secondary | ICD-10-CM | POA: Diagnosis not present

## 2021-05-26 DIAGNOSIS — J9611 Chronic respiratory failure with hypoxia: Secondary | ICD-10-CM | POA: Diagnosis not present

## 2021-06-01 DIAGNOSIS — R0902 Hypoxemia: Secondary | ICD-10-CM | POA: Diagnosis not present

## 2021-06-01 DIAGNOSIS — J449 Chronic obstructive pulmonary disease, unspecified: Secondary | ICD-10-CM | POA: Diagnosis not present

## 2021-06-01 DIAGNOSIS — J441 Chronic obstructive pulmonary disease with (acute) exacerbation: Secondary | ICD-10-CM | POA: Diagnosis not present

## 2021-06-08 ENCOUNTER — Other Ambulatory Visit: Payer: Self-pay | Admitting: *Deleted

## 2021-06-08 NOTE — Patient Outreach (Signed)
Care Management Clinical Social Work Note  06/08/2021 Name: Karla Clark MRN: [id number] DOB: [birth year]  Karla Clark is a 76 y.o. year old female who is a primary care patient of Karla Clark.  The Care Management team was consulted for assistance with chronic disease management and coordination needs.  Engaged with patient's daughter by telephone for follow up visit in response to provider referral for social work chronic care management and care coordination services  Consent to Services:  Karla Clark was given information about Care Management services today including:  Care Management services includes personalized support from designated clinical staff supervised by her physician, including individualized plan of care and coordination with other care providers 24/7 contact phone numbers for assistance for urgent and routine care needs. The patient may stop case management services at any time by phone call to the office staff.  Patient agreed to services and consent obtained.   Assessment: Review of patient past medical history, allergies, medications, and health status, including review of relevant consultants reports was performed today as part of a comprehensive evaluation and provision of chronic care management and care coordination services.  SDOH (Social Determinants of Health) assessments and interventions performed:    Advanced Directives Status: Not addressed in this encounter.  Care Plan  Allergies  Allergen Reactions   Ace Inhibitors Cough    Outpatient Encounter Medications as of 06/08/2021  Medication Sig   albuterol (PROVENTIL) (2.5 MG/3ML) 0.083% nebulizer solution Take 2.5 mg by nebulization every 6 (six) hours as needed for wheezing or shortness of breath.   diazepam (VALIUM) 5 MG tablet Take 1 tablet (5 mg total) by mouth every 6 (six) hours as needed for muscle spasms.   ferrous sulfate 325 (65 FE) MG tablet Take 1 tablet (325 mg total) by  mouth daily.   Fluticasone-Umeclidin-Vilant (TRELEGY ELLIPTA) 100-62.5-25 MCG/INH AEPB Inhale 1 puff into the lungs daily.   folic acid (FOLVITE) 1 MG tablet TAKE 1 TABLET BY MOUTH EVERY DAY (Patient taking differently: Take 1 mg by mouth daily. )   furosemide (LASIX) 40 MG tablet Take 1 tablet (40 mg total) by mouth daily.   gabapentin (NEURONTIN) 600 MG tablet Take 1,200 mg by mouth 2 (two) times daily.   HYDROcodone-acetaminophen (NORCO) 5-325 MG tablet Take 1 tablet by mouth every 4 (four) hours as needed for moderate pain or severe pain.   hydrOXYzine (ATARAX/VISTARIL) 10 MG tablet Take 10 mg by mouth 3 (three) times daily as needed.   ipratropium-albuterol (DUONEB) 0.5-2.5 (3) MG/3ML SOLN Take 3 mLs by nebulization every 6 (six) hours as needed (shortness of breath).   levothyroxine (SYNTHROID) 100 MCG tablet Take 100 mcg by mouth daily.   levothyroxine (SYNTHROID) 75 MCG tablet Take 75 mcg by mouth daily.   losartan (COZAAR) 100 MG tablet Take 100 mg by mouth daily.   memantine (NAMENDA) 5 MG tablet Take 5 mg by mouth daily.   metFORMIN (GLUCOPHAGE-XR) 500 MG 24 hr tablet Take 500 mg by mouth 2 (two) times daily.    NYSTATIN powder Apply 1 g topically 2 (two) times daily as needed (rash).    pantoprazole (PROTONIX) 40 MG tablet Take 40 mg by mouth daily.   pioglitazone (ACTOS) 30 MG tablet Take 30 mg by mouth daily.    potassium chloride 20 MEQ TBCR Take 20 mEq by mouth daily.   predniSONE (DELTASONE) 10 MG tablet Take q day 6,5,4,3,2,1   simvastatin (ZOCOR) 40 MG tablet Take 40 mg by mouth daily at  6 PM.    venlafaxine XR (EFFEXOR-XR) 150 MG 24 hr capsule Take 150 mg by mouth every morning.   ziprasidone (GEODON) 20 MG capsule Take 20 mg by mouth every morning.   ziprasidone (GEODON) 40 MG capsule Take 40 mg by mouth at bedtime.    No facility-administered encounter medications on file as of 06/08/2021.    Patient Active Problem List   Diagnosis Date Noted   Acute respiratory  failure with hypoxia (HCC) 05/10/2019   Sepsis due to pneumonia (HCC) 03/07/2019   Acute lower UTI 03/07/2019   Acute on chronic respiratory failure with hypoxia (HCC) 03/07/2019   COPD with acute exacerbation (HCC) 03/07/2019   Acute metabolic encephalopathy 03/07/2019   Transient hypotension 03/07/2019   AKI (acute kidney injury) (HCC) 03/07/2019   Rheumatoid arthritis with rheumatoid factor of multiple sites without organ or systems involvement (HCC) 07/19/2016   High risk medications (not anticoagulants) long-term use 07/19/2016   Osteoarthritis of both knees 07/19/2016   Osteoarthritis of both hands 07/19/2016   Primary osteoarthritis of both feet 07/19/2016    Conditions to be addressed/monitored: COPD and HTN.  Corporate treasurer, Limited Social Support, Level of Care Concerns, ADL/IADL Limitations, Limited Access to Caregiver, Cognitive Deficits, Memory Deficits, and Lacks Knowledge of [employer].  Care Plan : LCSW Plan of Care  Updates made by Karla Stamps, LCSW since 06/08/2021 12:00 AM     Problem: Obtain Palliative Care Services to Keep Pain Under Control.   Priority: High     Goal: Obtain Palliative Care Services to Keep Pain Under Control.   Start Date: 05/25/2021  Expected End Date: 07/23/2021  This Visit's Progress: On track  Recent Progress: On track  Priority: High  Note:   Current Barriers: Chronic Care Management Needs in Patient with Chronic Obstructive Pulmonary Disease with Acute Exacerbation, Acute Respiratory Failure with Hypoxia, Acute Metabolic Encephalopathy, Acute Kidney Injury, and Dementia. Clinical Goals: Patient's daughter will work with LCSW to obtain palliative care services for patient in the home to help reduce and manage pain symptoms. Clinical Interventions: Mindfulness Meditation Strategies, Relaxation Techniques and Deep Breathing Exercises Readdressed and Encouraged Daily, Active Listening/ Reflection Utilized, Emotional  Support Provided, Problem Solving /Task-Centered Solutions Developed, Support Group Participation Encouraged, Increase Level of Activity/Exercise Emphasized, and Verbalization of Feelings Explored.      Discussed plans with patient's daughter for ongoing care management follow-up and provided direct contact information for care management team. Collaboration with Primary Care Physician, Dr. Charlott Clark regarding development and update of comprehensive plan of care as evidenced by provider attestation and co-signature. Collaboration with Primary Care Physician, Dr. Charlott Clark regarding order for palliative care services. Inter-disciplinary care team collaboration (see longitudinal plan of care). Continued discussion regarding benefits of additional support referrals, such as spiritual advisor or chaplain, pain or palliative care specialist, hospice care, home health care, private agency sitter services, respite care placement, etc.  Patient Goals/Self-Care Activities: Daughter will continue to work with LCSW on a bi-weekly basis, until palliative care services are in place in the home, and until respite care services are provided to daughter. Daughter agreed to hold a family meeting with siblings, nieces, and nephews, all currently residing in the home, to develop a schedule of who can assist, and when. Daughter will continue to review list of short-term respite care facilities, as well as long-term memory care assisted living facilities, for consideration of placement. Daughter will review list of in-home care and respite agencies, home health care agencies, and personal  care service providers, for consideration of services in the home.   Daughter agreed to follow-up with LCSW after scheduling her medical procedure, so that 24 hour care and supervision can be provided to you, either in the home or in a facility of interest.    Daughter will contact LCSW directly (# [phone] if she has  questions, needs assistance, or if additional social work needs are identified between now and our next scheduled telephone outreach call. Follow-Up Plan:  LCSW will follow-up with patient's daughter on 06/17/2021 at 9:00 am.   Danford BadJoanna Maricia Scotti, BSW, MSW, LCSW  Licensed Clinical Social Worker  [facility]  [address] [place] [address], [place], [place] [address] Physical Address-[address], [place], [place] [address] Toll Free Main # [phone] Fax # [phone] Cell # [phone] [email or web address]

## 2021-06-17 ENCOUNTER — Other Ambulatory Visit: Payer: Self-pay | Admitting: *Deleted

## 2021-06-17 NOTE — Patient Outreach (Signed)
Care Management Clinical Social Work Note  06/17/2021 Name: FRANK ZUPANCIC MRN: [id number] DOB: Jul 30, 1945  [name] is a 76 y.o. year old female who is a primary care patient of Charlott Rakes, MD.  The Care Management team was consulted for assistance with chronic disease management and coordination needs.  Engaged with patient's daughter by telephone for follow up visit in response to provider referral for social work chronic care management and care coordination services  Consent to Services:  [name] was given information about Care Management services today including:  Care Management services includes personalized support from designated clinical staff supervised by her physician, including individualized plan of care and coordination with other care providers 24/7 contact phone numbers for assistance for urgent and routine care needs. The patient may stop case management services at any time by phone call to the office staff.  Patient agreed to services and consent obtained.   Assessment: Review of patient past medical history, allergies, medications, and health status, including review of relevant consultants reports was performed today as part of a comprehensive evaluation and provision of chronic care management and care coordination services.  SDOH (Social Determinants of Health) assessments and interventions performed:    Advanced Directives Status: Not addressed in this encounter.  Care Plan  Allergies  Allergen Reactions   Ace Inhibitors Cough    Outpatient Encounter Medications as of 06/17/2021  Medication Sig   albuterol (PROVENTIL) (2.5 MG/3ML) 0.083% nebulizer solution Take 2.5 mg by nebulization every 6 (six) hours as needed for wheezing or shortness of breath.   diazepam (VALIUM) 5 MG tablet Take 1 tablet (5 mg total) by mouth every 6 (six) hours as needed for muscle spasms.   ferrous sulfate 325 (65 FE) MG tablet Take 1 tablet (325 mg total) by  mouth daily.   Fluticasone-Umeclidin-Vilant (TRELEGY ELLIPTA) 100-62.5-25 MCG/INH AEPB Inhale 1 puff into the lungs daily.   folic acid (FOLVITE) 1 MG tablet TAKE 1 TABLET BY MOUTH EVERY DAY (Patient taking differently: Take 1 mg by mouth daily. )   furosemide (LASIX) 40 MG tablet Take 1 tablet (40 mg total) by mouth daily.   gabapentin (NEURONTIN) 600 MG tablet Take 1,200 mg by mouth 2 (two) times daily.   HYDROcodone-acetaminophen (NORCO) 5-325 MG tablet Take 1 tablet by mouth every 4 (four) hours as needed for moderate pain or severe pain.   hydrOXYzine (ATARAX/VISTARIL) 10 MG tablet Take 10 mg by mouth 3 (three) times daily as needed.   ipratropium-albuterol (DUONEB) 0.5-2.5 (3) MG/3ML SOLN Take 3 mLs by nebulization every 6 (six) hours as needed (shortness of breath).   levothyroxine (SYNTHROID) 100 MCG tablet Take 100 mcg by mouth daily.   levothyroxine (SYNTHROID) 75 MCG tablet Take 75 mcg by mouth daily.   losartan (COZAAR) 100 MG tablet Take 100 mg by mouth daily.   memantine (NAMENDA) 5 MG tablet Take 5 mg by mouth daily.   metFORMIN (GLUCOPHAGE-XR) 500 MG 24 hr tablet Take 500 mg by mouth 2 (two) times daily.    NYSTATIN powder Apply 1 g topically 2 (two) times daily as needed (rash).    pantoprazole (PROTONIX) 40 MG tablet Take 40 mg by mouth daily.   pioglitazone (ACTOS) 30 MG tablet Take 30 mg by mouth daily.    potassium chloride 20 MEQ TBCR Take 20 mEq by mouth daily.   predniSONE (DELTASONE) 10 MG tablet Take q day 6,5,4,3,2,1   simvastatin (ZOCOR) 40 MG tablet Take 40 mg by mouth daily at  6 PM.    venlafaxine XR (EFFEXOR-XR) 150 MG 24 hr capsule Take 150 mg by mouth every morning.   ziprasidone (GEODON) 20 MG capsule Take 20 mg by mouth every morning.   ziprasidone (GEODON) 40 MG capsule Take 40 mg by mouth at bedtime.    No facility-administered encounter medications on file as of 06/17/2021.    Patient Active Problem List   Diagnosis Date Noted   Acute respiratory  failure with hypoxia (HCC) 05/10/2019   Sepsis due to pneumonia (HCC) 03/07/2019   Acute lower UTI 03/07/2019   Acute on chronic respiratory failure with hypoxia (HCC) 03/07/2019   COPD with acute exacerbation (HCC) 03/07/2019   Acute metabolic encephalopathy 03/07/2019   Transient hypotension 03/07/2019   AKI (acute kidney injury) (HCC) 03/07/2019   Rheumatoid arthritis with rheumatoid factor of multiple sites without organ or systems involvement (HCC) 07/19/2016   High risk medications (not anticoagulants) long-term use 07/19/2016   Osteoarthritis of both knees 07/19/2016   Osteoarthritis of both hands 07/19/2016   Primary osteoarthritis of both feet 07/19/2016    Conditions to be addressed/monitored: COPD, Anxiety, and Depression.  Limited Social Support, Level of Care Concerns, ADL/IADL Limitations, Mental Health Concerns, Family and Relationship Dysfunction, Social Isolation, Limited Access to Caregiver, Cognitive Deficits, Memory Deficits, and Lacks Knowledge of [employer].  Care Plan : LCSW Plan of Care  Updates made by Karolee Stamps, LCSW since 06/17/2021 12:00 AM     Problem: Obtain Palliative Care Services to Keep Pain Under Control.   Priority: High     Goal: Obtain Palliative Care Services to Keep Pain Under Control.   Start Date: 05/25/2021  Expected End Date: 07/23/2021  This Visit's Progress: On track  Recent Progress: On track  Priority: High  Note:   Current Barriers: Chronic Care Management Needs in Patient with Chronic Obstructive Pulmonary Disease with Acute Exacerbation, Acute Respiratory Failure with Hypoxia, Acute Metabolic Encephalopathy, Acute Kidney Injury, and Dementia. Clinical Goals: Patient's daughter will work with LCSW, in an effort to obtain palliative care services for patient in the home, to help reduce and manage pain symptoms. Patient's daughter will work with LCSW, in an effort to receive assistance with placement for patient into a  long-term memory care assisted living facility. Interventions: Collaboration with Primary Care Physician, Dr. Charlott Rakes regarding development and update of comprehensive plan of care as evidenced by provider attestation and co-signature. Collaboration with Primary Care Physician, Dr. Charlott Rakes regarding order for palliative care services, and referral to [facility]. Collaboration with Primary Care Physician, Dr. Charlott Rakes to request completed and signed FL-2 Form, for placement into a long-term memory care assisted living facility. Inter-disciplinary care team collaboration (see longitudinal plan of care). Clinical Interventions: Mindfulness Meditation Strategies, Relaxation Techniques, and Deep Breathing Exercises Reinforced Daily. Active Listening/Reflection Utilized. Emotional Support Provided and Verbalization of Feelings Encouraged. Problem Solving/Task-Centered Solutions Developed. Continued discussion regarding palliative care services in the home, versus long-term memory care assisted living facility placement.  Patient Goals/Self-Care Activities: Daughter will continue to work with LCSW on a bi-weekly basis, until palliative care services are in place in the home, or until patient receives long-term care placement into a memory care assisted living facility.   Collaboration with Primary Care Physician, Dr. Charlott Rakes to request order for palliative care services, and referral to [facility] [phone]). Collaboration with Primary Care Physician, Dr. Charlott Rakes to request completed and signed FL-2 Form, for placement into a long-term memory  care assisted living facility. Daughter will continue to review list of short-term respite care facilities, as well as long-term memory care assisted living facilities, for consideration of placement. Daughter will review list of in-home care and respite agencies, home health care  agencies, and personal care service providers, for consideration of services in the home, in addition to palliative care services through [facility]. Daughter will schedule an appointment to speak with a representative at the [facility] [phone]), to discuss legal rights and possible recourse against brother, also residing in the home.  Daughter will contact LCSW directly (# [phone]) if she has questions, needs assistance, or if additional social work needs are identified between now and our next scheduled telephone outreach call. Follow-Up Plan:  07/01/2021 at 9:15 am   Danford Bad, BSW, MSW, LCSW  Licensed Clinical Social Worker  [facility]  Mailing [place] [address] [place], [place] [address] Physical Address-[address] [place], [place] [address] Toll Free Main # [phone] Fax # [phone] Cell # [phone] [email or web address]

## 2021-06-26 DIAGNOSIS — J9611 Chronic respiratory failure with hypoxia: Secondary | ICD-10-CM | POA: Diagnosis not present

## 2021-06-26 DIAGNOSIS — J449 Chronic obstructive pulmonary disease, unspecified: Secondary | ICD-10-CM | POA: Diagnosis not present

## 2021-06-26 DIAGNOSIS — I5030 Unspecified diastolic (congestive) heart failure: Secondary | ICD-10-CM | POA: Diagnosis not present

## 2021-06-29 DIAGNOSIS — J449 Chronic obstructive pulmonary disease, unspecified: Secondary | ICD-10-CM | POA: Diagnosis not present

## 2021-06-30 DIAGNOSIS — R0902 Hypoxemia: Secondary | ICD-10-CM | POA: Diagnosis not present

## 2021-06-30 DIAGNOSIS — J441 Chronic obstructive pulmonary disease with (acute) exacerbation: Secondary | ICD-10-CM | POA: Diagnosis not present

## 2021-07-01 ENCOUNTER — Other Ambulatory Visit: Payer: Self-pay | Admitting: *Deleted

## 2021-07-01 NOTE — Patient Outreach (Signed)
Care Management Clinical Social Work Note  07/01/2021 Name: Karla Clark MRN: [id number] DOB: [birth year]  Karla Clark is a 76 y.o. year old female who is a primary care patient of Maryella Shivers, MD.  The Care Management team was consulted for assistance with chronic disease management and coordination needs.  Engaged with patient's daughter by telephone for follow up visit in response to provider referral for social work chronic care management and care coordination services  Consent to Services:  Ms. Loma Newton was given information about Care Management services today including:  Care Management services includes personalized support from designated clinical staff supervised by her physician, including individualized plan of care and coordination with other care providers 24/7 contact phone numbers for assistance for urgent and routine care needs. The patient may stop case management services at any time by phone call to the office staff.  Patient agreed to services and consent obtained.   Assessment: Review of patient past medical history, allergies, medications, and health status, including review of relevant consultants reports was performed today as part of a comprehensive evaluation and provision of chronic care management and care coordination services.  SDOH (Social Determinants of Health) assessments and interventions performed:    Advanced Directives Status: Not addressed in this encounter.  Care Plan  Allergies  Allergen Reactions   Ace Inhibitors Cough    Outpatient Encounter Medications as of 07/01/2021  Medication Sig   albuterol (PROVENTIL) (2.5 MG/3ML) 0.083% nebulizer solution Take 2.5 mg by nebulization every 6 (six) hours as needed for wheezing or shortness of breath.   diazepam (VALIUM) 5 MG tablet Take 1 tablet (5 mg total) by mouth every 6 (six) hours as needed for muscle spasms.   ferrous sulfate 325 (65 FE) MG tablet Take 1 tablet (325 mg total) by  mouth daily.   Fluticasone-Umeclidin-Vilant (TRELEGY ELLIPTA) 100-62.5-25 MCG/INH AEPB Inhale 1 puff into the lungs daily.   folic acid (FOLVITE) 1 MG tablet TAKE 1 TABLET BY MOUTH EVERY DAY (Patient taking differently: Take 1 mg by mouth daily. )   furosemide (LASIX) 40 MG tablet Take 1 tablet (40 mg total) by mouth daily.   gabapentin (NEURONTIN) 600 MG tablet Take 1,200 mg by mouth 2 (two) times daily.   HYDROcodone-acetaminophen (NORCO) 5-325 MG tablet Take 1 tablet by mouth every 4 (four) hours as needed for moderate pain or severe pain.   hydrOXYzine (ATARAX/VISTARIL) 10 MG tablet Take 10 mg by mouth 3 (three) times daily as needed.   ipratropium-albuterol (DUONEB) 0.5-2.5 (3) MG/3ML SOLN Take 3 mLs by nebulization every 6 (six) hours as needed (shortness of breath).   levothyroxine (SYNTHROID) 100 MCG tablet Take 100 mcg by mouth daily.   levothyroxine (SYNTHROID) 75 MCG tablet Take 75 mcg by mouth daily.   losartan (COZAAR) 100 MG tablet Take 100 mg by mouth daily.   memantine (NAMENDA) 5 MG tablet Take 5 mg by mouth daily.   metFORMIN (GLUCOPHAGE-XR) 500 MG 24 hr tablet Take 500 mg by mouth 2 (two) times daily.    NYSTATIN powder Apply 1 g topically 2 (two) times daily as needed (rash).    pantoprazole (PROTONIX) 40 MG tablet Take 40 mg by mouth daily.   pioglitazone (ACTOS) 30 MG tablet Take 30 mg by mouth daily.    potassium chloride 20 MEQ TBCR Take 20 mEq by mouth daily.   predniSONE (DELTASONE) 10 MG tablet Take q day 6,5,4,3,2,1   simvastatin (ZOCOR) 40 MG tablet Take 40 mg by mouth daily at  6 PM.    venlafaxine XR (EFFEXOR-XR) 150 MG 24 hr capsule Take 150 mg by mouth every morning.   ziprasidone (GEODON) 20 MG capsule Take 20 mg by mouth every morning.   ziprasidone (GEODON) 40 MG capsule Take 40 mg by mouth at bedtime.    No facility-administered encounter medications on file as of 07/01/2021.    Patient Active Problem List   Diagnosis Date Noted   Acute respiratory  failure with hypoxia ([place]) 05/10/2019   Sepsis due to pneumonia ([place]) 03/07/2019   Acute lower UTI 03/07/2019   Acute on chronic respiratory failure with hypoxia ([place]) 03/07/2019   COPD with acute exacerbation ([place]) 99991111   Acute metabolic encephalopathy 99991111   Transient hypotension 03/07/2019   AKI (acute kidney injury) ([place]) 03/07/2019   Rheumatoid arthritis with rheumatoid factor of multiple sites without organ or systems involvement ([place]) 07/19/2016   High risk medications (not anticoagulants) long-term use 07/19/2016   Osteoarthritis of both knees 07/19/2016   Osteoarthritis of both hands 07/19/2016   Primary osteoarthritis of both feet 07/19/2016    Conditions to be addressed/monitored: COPD, Osteoarthritis, Caregiver Stress, and Memory Deficits.  Limited Social Support, Level of Care Concerns, ADL/IADL Limitations, Social Isolation, Limited Access to Caregiver, Cognitive Deficits, Memory Deficits, and Lacks Knowledge of [employer].  Care Plan : LCSW Plan of Care  Updates made by Francis Gaines, LCSW since 07/01/2021 12:00 AM     Problem: Obtain Palliative Care Services to Keep Pain Under Control.   Priority: High     Goal: Obtain Palliative Care Services to Keep Pain Under Control.   Start Date: 05/25/2021  Expected End Date: 07/23/2021  This Visit's Progress: On track  Recent Progress: On track  Priority: High  Note:   Current Barriers: Chronic Care Management Needs in Patient with Chronic Obstructive Pulmonary Disease with Acute Exacerbation, Acute Respiratory Failure with Hypoxia, Acute Metabolic Encephalopathy, Acute Kidney Injury, and Dementia. Clinical Goals: Patient's daughter will work with LCSW, in an effort to obtain palliative care services for patient in the home, to help reduce and manage pain symptoms. Patient's daughter will work with LCSW, in an effort to receive assistance with placement for patient into a long-term memory care assisted  living facility. Interventions: Collaboration with Primary Care Physician, Dr. Maryella Shivers regarding development and update of comprehensive plan of care as evidenced by provider attestation and co-signature. Collaboration with Primary Care Physician, Dr. Holley Bouche Nurse, regarding order for palliative care services, and referral to placement to [facility]. Inter-disciplinary care team collaboration (see longitudinal plan of care). Clinical Interventions: Mindfulness Meditation Strategies, Relaxation Techniques, and Deep Breathing Exercises Reinforced Daily. Active Listening/Reflection Utilized. Emotional Support Provided and Verbalization of Feelings Encouraged. Problem Solving/Task-Centered Solutions Developed. Patient Goals/Self-Care Activities: Daughter will continue to work with LCSW on a bi-weekly basis, until community palliative care services are in place, through [facility].  Collaboration with Nurse for Primary Care Physician, Dr. Maryella Shivers, to request order for community palliative care services, and referral placement to [facility] (# [phone]). Daughter will continue to consider scheduling an appointment to speak with a representative at the [facility] [phone]), to discuss legal rights and possible recourse against brother, also residing in the home.  Daughter encouraged to schedule her surgery, securing 24 hour care and supervision through your other daughter, while hospitalized, and during her recovery process. Daughter will review list of mental health providers, and consider self-enrollment in  ongoing counseling and supportive services for symptoms of anxiety and caregiver stress, from the list provided. Daughter will contact LCSW directly (# [phone]) if she has questions, needs assistance, or if additional social work needs  are identified between now and our next scheduled telephone outreach call. Follow-Up Plan:  07/15/2021 at 11:00 am   Nat Christen, BSW, MSW, [place]  Licensed Clinical Social Worker  [place]  Mailing [place] [address] [place], [place] [address] Physical Address-[address], [place], [place] [address] Toll Free Main # [phone] Fax # [phone] Cell # [phone] [email or web address]

## 2021-07-03 DIAGNOSIS — J441 Chronic obstructive pulmonary disease with (acute) exacerbation: Secondary | ICD-10-CM | POA: Diagnosis not present

## 2021-07-03 DIAGNOSIS — Z20822 Contact with and (suspected) exposure to covid-19: Secondary | ICD-10-CM | POA: Diagnosis not present

## 2021-07-08 ENCOUNTER — Telehealth: Payer: Self-pay | Admitting: Student

## 2021-07-08 NOTE — Telephone Encounter (Signed)
Attempted to contact daughter Sheliah Mends, to schedule Palliative Consult, no answer and unable to leave message due to no VM set up ?

## 2021-07-09 ENCOUNTER — Telehealth: Payer: Self-pay | Admitting: Student

## 2021-07-09 NOTE — Telephone Encounter (Signed)
Ret'd call to daughter Kristeen Miss, and we discussed the Palliative referral/services and she was in agreement with scheduling visit.  I have scheduled a [place] for 07/14/21 @ 1 PM. ?

## 2021-07-14 ENCOUNTER — Other Ambulatory Visit: Payer: Self-pay

## 2021-07-14 ENCOUNTER — Other Ambulatory Visit: Payer: Self-pay | Admitting: Student

## 2021-07-14 DIAGNOSIS — R531 Weakness: Secondary | ICD-10-CM

## 2021-07-14 DIAGNOSIS — R0602 Shortness of breath: Secondary | ICD-10-CM | POA: Diagnosis not present

## 2021-07-14 DIAGNOSIS — B370 Candidal stomatitis: Secondary | ICD-10-CM | POA: Diagnosis not present

## 2021-07-14 DIAGNOSIS — F3132 Bipolar disorder, current episode depressed, moderate: Secondary | ICD-10-CM

## 2021-07-14 DIAGNOSIS — Z515 Encounter for palliative care: Secondary | ICD-10-CM

## 2021-07-14 DIAGNOSIS — J449 Chronic obstructive pulmonary disease, unspecified: Secondary | ICD-10-CM

## 2021-07-14 NOTE — Progress Notes (Signed)
? ? ?Civil engineer, contracting ?Community Palliative Care Consult Note ?Telephone: [phone]  ?Fax: [phone]  ? ?Date of encounter: 07/14/21 ?1:20 PM ?PATIENT NAME: Karla Clark ?[address] ?[place] [address]   ?[phone] (home)  ?DOB: 30-Mar-1946 ?MRN: [id number] ?PRIMARY CARE PROVIDER:    ?Charlott Rakes, MD,  ?[address] ?[place] [address] ?[phone] ? ?REFERRING PROVIDER:   ?Charlott Rakes, MD ?[address] ?[address] ?[place],  [place] [address] ?[phone] ? ?RESPONSIBLE PARTY:    ?Contact Information   ? ? Name Relation Home Work Mobile  ? Clavijo,Leisa Daughter [phone]  [phone]  ? ?  ? ? ?Due to the COVID-19 crisis, this visit was done via telemedicine from my office and it was initiated and consent by this patient and or family. ? ?I connected with  Carmita T Korff OR PROXY on 07/14/2021 by a video enabled telemedicine application and verified that I am speaking with the correct person using two identifiers. ?  ?I discussed the limitations of evaluation and management by telemedicine. The patient expressed understanding and agreed to proceed. ? ? ? ?                                 ASSESSMENT AND PLAN / RECOMMENDATIONS:  ? ?Advance Care Planning/Goals of Care: Goals include to maximize quality of life and symptom management. Patient/health care surrogate gave his/her permission to discuss.Our advance care planning conversation included a discussion about:    ?The value and importance of advance care planning  ?Experiences with loved ones who have been seriously ill or have died  ?Exploration of personal, cultural or spiritual beliefs that might influence medical decisions  ?Exploration of goals of care in the event of a sudden injury or illness  ?CODE STATUS: To be decided; ongoing discussion. ? ?Education provided on Palliative Medicine vs. Hospice services. Palliative Medicine will continue to provide ongoing support, symptom management.  Daughter would like for patient to be comfortable, remain in the home. ? ?Symptom Management/Plan: ? ?COPD-patient with shortness of breath with exertion. Continue nebulizers/inhalers as directed. Encourage patient to rinse mouth out. Patient with thrush-likely secondary to recent antibiotic course and inhalers. Start Nystatin swish and swallow QID x 7 days.  ? ?Bipolar 1, schizophrenia-continue venlafaxine and ziprasidone as directed per psychiatry. Medications managed by psychiatry; f/u as scheduled.  ? ?Generalized weakness-family to continue providing supportive care. Monitor for falls/safety. Encourage patient to be out of bed daily.  ? ?Follow up Palliative Care Visit: Palliative care will continue to follow for complex medical decision making, advance care planning, and clarification of goals. Return 6-8 weeks or prn. ? ? ?This visit was coded based on medical decision making (MDM). ? ?PPS: 40% ? ?HOSPICE ELIGIBILITY/DIAGNOSIS: TBD ? ?Chief Complaint: Palliative Medicine initial consult. ? ?HISTORY OF PRESENT ILLNESS:  RONALD MODGLIN is a 76 y.o. year old female  with COPD, type 2 diabetes, chronic pain, hypothyroidism, hypertensin, Bipolar 1, schizophrenia.   ? ?Patient resides at home with family.  She requires assistance with all ADLs; she has been nonambulatory since August 2022.  She sometimes requires assistance with feeding due to her tremors. Pain is currently managed with current regimen. She does endorse shortness of breath with exertion. Patient has developed thrush in the past several days; recently treated with Levaquin for pneumonia. Patient is followed psychotic followed by psychiatry seen every 3 months. ? ?History obtained from  review of EMR, discussion with primary team, and interview with family, facility staff/caregiver and/or Ms. Arias.  ?I reviewed available labs, medications, imaging, studies and related documents from the EMR.  Records reviewed and summarized above.   ? ?ROS ? ?General: NAD ?EYES: denies vision changes ?ENMT: denies dysphagia ?Cardiovascular: denies chest pain, DOE ?Pulmonary: denies increased SOB ?Abdomen: endorses good appetite, denies constipation ?GU: denies dysuria, incontinence of urine ?MSK:  +weakness,  no falls reported ?Skin: denies rashes or wounds ?Neurological: denies pain, denies insomnia ?Psych: Endorses stable mood ?Heme/lymph/immuno: denies bruises, abnormal bleeding ? ?Physical Exam: ?Constitutional: NAD ?General: frail appearing, thin ?EYES: anicteric sclera, lids intact, no discharge  ?ENMT: intact hearing, oral candidiasis ?Pulmonary: no increased work of breathing, no cough, room air ?GU: deferred ?MSK: moves all extremities, non-ambulatory ?Skin: no rashes or wounds on visible skin ?Neuro:  +generalized weakness, A & O to person, place, time ?Psych: non-anxious affect, pleasant ?Hem/lymph/immuno: no widespread bruising ?CURRENT PROBLEM LIST:  ?Patient Active Problem List  ? Diagnosis Date Noted  ? Acute respiratory failure with hypoxia ([place]) 05/10/2019  ? Sepsis due to pneumonia ([place]) 03/07/2019  ? Acute lower UTI 03/07/2019  ? Acute on chronic respiratory failure with hypoxia ([place]) 03/07/2019  ? COPD with acute exacerbation ([place]) 03/07/2019  ? Acute metabolic encephalopathy 99991111  ? Transient hypotension 03/07/2019  ? AKI (acute kidney injury) ([place]) 03/07/2019  ? Rheumatoid arthritis with rheumatoid factor of multiple sites without organ or systems involvement ([place]) 07/19/2016  ? High risk medications (not anticoagulants) long-term use 07/19/2016  ? Osteoarthritis of both knees 07/19/2016  ? Osteoarthritis of both hands 07/19/2016  ? Primary osteoarthritis of both feet 07/19/2016  ? ?PAST MEDICAL HISTORY:  ?Active Ambulatory Problems  ?  Diagnosis Date Noted  ? Rheumatoid arthritis with rheumatoid factor of multiple sites without organ or systems involvement ([place]) 07/19/2016  ? High risk medications (not anticoagulants) long-term use  07/19/2016  ? Osteoarthritis of both knees 07/19/2016  ? Osteoarthritis of both hands 07/19/2016  ? Primary osteoarthritis of both feet 07/19/2016  ? Sepsis due to pneumonia ([place]) 03/07/2019  ? Acute lower UTI 03/07/2019  ? Acute on chronic respiratory failure with hypoxia ([place]) 03/07/2019  ? COPD with acute exacerbation ([place]) 03/07/2019  ? Acute metabolic encephalopathy 99991111  ? Transient hypotension 03/07/2019  ? AKI (acute kidney injury) ([place]) 03/07/2019  ? Acute respiratory failure with hypoxia ([place]) 05/10/2019  ? ?Resolved Ambulatory Problems  ?  Diagnosis Date Noted  ? No Resolved Ambulatory Problems  ? ?Past Medical History:  ?Diagnosis Date  ? Anxiety   ? COPD (chronic obstructive pulmonary disease) ([place])   ? Diabetes mellitus without complication ([place])   ? Elevated cholesterol   ? Hypertension   ? Hypothyroidism   ? Osteoarthritis   ? RA (rheumatoid arthritis) ([place])   ? Schizophrenia ([place])   ? ?SOCIAL HX:  ?Social History  ? ?Tobacco Use  ? Smoking status: Former  ?  Types: Cigarettes  ?  Quit date: 03/11/1986  ?  Years since quitting: 35.3  ?  Passive exposure: Past  ? Smokeless tobacco: Never  ? Tobacco comments:  ?  Verified by Daughter - Carol Ada  ?Substance Use Topics  ? Alcohol use: No  ? ?FAMILY HX: No family history on file.   ? ?ALLERGIES:  ?Allergies  ?Allergen Reactions  ? Ace Inhibitors Cough  ?   ?PERTINENT MEDICATIONS:  ?Outpatient Encounter Medications as of 07/14/2021  ?Medication Sig  ? albuterol (PROVENTIL) (2.5 MG/3ML) 0.083%  nebulizer solution Take 2.5 mg by nebulization every 6 (six) hours as needed for wheezing or shortness of breath.  ? diazepam (VALIUM) 5 MG tablet Take 1 tablet (5 mg total) by mouth every 6 (six) hours as needed for muscle spasms.  ? ferrous sulfate 325 (65 FE) MG tablet Take 1 tablet (325 mg total) by mouth daily.  ? Fluticasone-Umeclidin-Vilant (TRELEGY ELLIPTA) 100-62.5-25 MCG/INH AEPB Inhale 1 puff into the lungs daily.  ? folic acid (FOLVITE) 1 MG  tablet TAKE 1 TABLET BY MOUTH EVERY DAY (Patient taking differently: Take 1 mg by mouth daily. )  ? furosemide (LASIX) 40 MG tablet Take 1 tablet (40 mg total) by mouth daily.  ? gabapentin (NEURONTIN) 600 MG tablet Ta

## 2021-07-15 ENCOUNTER — Other Ambulatory Visit: Payer: Self-pay | Admitting: *Deleted

## 2021-07-15 NOTE — Patient Outreach (Signed)
Care Management ?Clinical Social Work Note ? ?07/15/2021 ?Name: Karla Clark MRN: [id number] DOB: 1945/08/28 ? ?Karla Clark is a 76 y.o. year old female who is a primary care patient of Charlott Rakes, MD.  The Care Management team was consulted for assistance with chronic disease management and coordination needs. ? ?Engaged with patient's daughter by telephone for follow up visit in response to provider referral for social work chronic care management and care coordination services ? ?Consent to Services:  ?[name] was given information about Care Management services today including:  ?Care Management services includes personalized support from designated clinical staff supervised by her physician, including individualized plan of care and coordination with other care providers ?24/7 contact phone numbers for assistance for urgent and routine care needs. ?The patient may stop case management services at any time by phone call to the office staff. ? ?Patient agreed to services and consent obtained.  ? ?Assessment: Review of patient past medical history, allergies, medications, and health status, including review of relevant consultants reports was performed today as part of a comprehensive evaluation and provision of chronic care management and care coordination services. ? ?SDOH (Social Determinants of Health) assessments and interventions performed:   ? ?Advanced Directives Status: Not addressed in this encounter. ? ?Care Plan ? ?Allergies  ?Allergen Reactions  ? Ace Inhibitors Cough  ? ? ?Outpatient Encounter Medications as of 07/15/2021  ?Medication Sig  ? albuterol (PROVENTIL) (2.5 MG/3ML) 0.083% nebulizer solution Take 2.5 mg by nebulization every 6 (six) hours as needed for wheezing or shortness of breath.  ? diazepam (VALIUM) 5 MG tablet Take 1 tablet (5 mg total) by mouth every 6 (six) hours as needed for muscle spasms.  ? ferrous sulfate 325 (65 FE) MG tablet Take 1 tablet (325 mg total) by  mouth daily.  ? Fluticasone-Umeclidin-Vilant (TRELEGY ELLIPTA) 100-62.5-25 MCG/INH AEPB Inhale 1 puff into the lungs daily.  ? folic acid (FOLVITE) 1 MG tablet TAKE 1 TABLET BY MOUTH EVERY DAY (Patient taking differently: Take 1 mg by mouth daily. )  ? furosemide (LASIX) 40 MG tablet Take 1 tablet (40 mg total) by mouth daily.  ? gabapentin (NEURONTIN) 600 MG tablet Take 1,200 mg by mouth 2 (two) times daily.  ? HYDROcodone-acetaminophen (NORCO) 5-325 MG tablet Take 1 tablet by mouth every 4 (four) hours as needed for moderate pain or severe pain.  ? hydrOXYzine (ATARAX/VISTARIL) 10 MG tablet Take 10 mg by mouth 3 (three) times daily as needed.  ? ipratropium-albuterol (DUONEB) 0.5-2.5 (3) MG/3ML SOLN Take 3 mLs by nebulization every 6 (six) hours as needed (shortness of breath).  ? levothyroxine (SYNTHROID) 100 MCG tablet Take 100 mcg by mouth daily.  ? levothyroxine (SYNTHROID) 75 MCG tablet Take 75 mcg by mouth daily.  ? losartan (COZAAR) 100 MG tablet Take 100 mg by mouth daily.  ? memantine (NAMENDA) 5 MG tablet Take 5 mg by mouth daily.  ? metFORMIN (GLUCOPHAGE-XR) 500 MG 24 hr tablet Take 500 mg by mouth 2 (two) times daily.   ? NYSTATIN powder Apply 1 g topically 2 (two) times daily as needed (rash).   ? pantoprazole (PROTONIX) 40 MG tablet Take 40 mg by mouth daily.  ? pioglitazone (ACTOS) 30 MG tablet Take 30 mg by mouth daily.   ? potassium chloride 20 MEQ TBCR Take 20 mEq by mouth daily.  ? predniSONE (DELTASONE) 10 MG tablet Take q day 6,5,4,3,2,1  ? simvastatin (ZOCOR) 40 MG tablet Take 40 mg by mouth daily at  6 PM.   ? venlafaxine XR (EFFEXOR-XR) 150 MG 24 hr capsule Take 150 mg by mouth every morning.  ? ziprasidone (GEODON) 20 MG capsule Take 20 mg by mouth every morning.  ? ziprasidone (GEODON) 40 MG capsule Take 40 mg by mouth at bedtime.   ? ?No facility-administered encounter medications on file as of 07/15/2021.  ? ? ?Patient Active Problem List  ? Diagnosis Date Noted  ? Acute respiratory  failure with hypoxia ([facility]) 05/10/2019  ? Sepsis due to pneumonia ([facility]) 03/07/2019  ? Acute lower UTI 03/07/2019  ? Acute on chronic respiratory failure with hypoxia ([facility]) 03/07/2019  ? COPD with acute exacerbation ([facility]) 03/07/2019  ? Acute metabolic encephalopathy 03/07/2019  ? Transient hypotension 03/07/2019  ? AKI (acute kidney injury) ([facility]) 03/07/2019  ? Rheumatoid arthritis with rheumatoid factor of multiple sites without organ or systems involvement ([facility]) 07/19/2016  ? High risk medications (not anticoagulants) long-term use 07/19/2016  ? Osteoarthritis of both knees 07/19/2016  ? Osteoarthritis of both hands 07/19/2016  ? Primary osteoarthritis of both feet 07/19/2016  ? ? ?Conditions to be addressed/monitored: Chronic Obstructive Pulmonary Disease with Acute Exacerbation, Acute Respiratory Failure with Hypoxia, Acute Metabolic Encephalopathy, Acute Kidney Injury, and Dementia.  Limited Social Support, Level of Care Concerns, ADL/IADL Limitations, Mental Health Concerns, Family and Relationship Dysfunction, Social Isolation, Limited Access to Caregiver, Cognitive Deficits, Memory Deficits, and Lacks Knowledge of [employer]. ? ?Care Plan : LCSW Plan of Care  ?Updates made by Karolee Stamps, LCSW since 07/15/2021 12:00 AM  ?  ? ?Problem: Obtain Palliative Care Services to Keep Pain Under Control.   ?Priority: High  ?  ? ?Goal: Obtain Palliative Care Services to Keep Pain Under Control.   ?Start Date: 05/25/2021  ?Expected End Date: 07/29/2021  ?This Visit's Progress: On track  ?Recent Progress: On track  ?Priority: High  ?Note:   ?Current Barriers: ?Chronic Care Management Needs in Patient with Chronic Obstructive Pulmonary Disease with Acute Exacerbation, Acute Respiratory Failure with Hypoxia, Acute Metabolic Encephalopathy, Acute Kidney Injury, and Dementia. ?Clinical Goals: ?Patient's daughter will work with LCSW, in an effort to obtain palliative care services for patient in the home, to help  reduce and manage pain symptoms. ?Patient's daughter will work with LCSW, in an effort to receive assistance with placement for patient into a long-term memory care assisted living facility. ?Interventions: ?Collaboration with Primary Care Physician, Dr. Charlott Rakes regarding development and update of comprehensive plan of care as evidenced by provider attestation and co-signature. ?Collaboration with Primary Care Physician, Dr. Myrtha Mantis Nurse, regarding order for palliative care services, and referral to placement to [facility]. ?Inter-disciplinary care team collaboration (see longitudinal plan of care). ?Clinical Interventions: ?Mindfulness Meditation Strategies, Relaxation Techniques, and Deep Breathing Exercises Reinforced Daily. ?Active Listening/Reflection Utilized. ?Emotional Support Provided and Verbalization of Feelings Encouraged. ?Problem Solving/Task-Centered Solutions Developed. ?Caregiver Stress Acknowledged. ?Patient Goals/Self-Care Activities: ?Daughter will continue to work with LCSW on a bi-weekly basis, until [employer] are in place, through [employer].  ?Daughter had successful telehealth palliative consult with Merilynn Finland, Licensed Clinical Social Worker with Civil engineer, contracting, on 07/14/2021 at 1:00 pm, in an effort to establish Community Palliative Care Services in the home.   ?Daughter had successful telehealth palliative consult with Gwinda Maine, Nurse Practitioner with [facility], on 07/14/2021 at 1:00 pm, in an effort to establish [facility] in the home.  ?Daughter will continue to consider self-enrollment in ongoing mental health counseling  and supportive services, to reduce and manage symptoms of anxiety and caregiver stress, from the list provided. ?Daughter will contact LCSW directly (# [phone] if she has questions, needs assistance, or if  additional social work needs are identified between now and our next scheduled telephone outreach call. ?Follow-Up Plan:  07/29/2021 at 9:45 am ?  ?Danford BadJoanna Kash [place], BSW, MSW, LCSW  ?Licensed Clinical Social

## 2021-07-24 DIAGNOSIS — J449 Chronic obstructive pulmonary disease, unspecified: Secondary | ICD-10-CM | POA: Diagnosis not present

## 2021-07-24 DIAGNOSIS — J9611 Chronic respiratory failure with hypoxia: Secondary | ICD-10-CM | POA: Diagnosis not present

## 2021-07-24 DIAGNOSIS — I5030 Unspecified diastolic (congestive) heart failure: Secondary | ICD-10-CM | POA: Diagnosis not present

## 2021-07-29 ENCOUNTER — Other Ambulatory Visit: Payer: Self-pay | Admitting: *Deleted

## 2021-07-29 NOTE — Patient Outreach (Signed)
Care Management ?Clinical Social Work Note ? ?07/29/2021 ?Name: Karla Clark MRN: [id number] DOB: 23-Aug-1945 ? ?Karla Clark is a 76 y.o. year old female who is a primary care patient of Karla Shivers, MD.  The Care Management team was consulted for assistance with chronic disease management and coordination needs. ? ?Engaged with patient's daughter by telephone for follow up visit in response to provider referral for social work chronic care management and care coordination services ? ?Consent to Services:  ?Karla Clark was given information about Care Management services today including:  ?Care Management services includes personalized support from designated clinical staff supervised by her physician, including individualized plan of care and coordination with other care providers ?24/7 contact phone numbers for assistance for urgent and routine care needs. ?The patient may stop case management services at any time by phone call to the office staff. ? ?Patient agreed to services and consent obtained.  ? ?Assessment: Review of patient past medical history, allergies, medications, and health status, including review of relevant consultants reports was performed today as part of a comprehensive evaluation and provision of chronic care management and care coordination services. ? ?SDOH (Social Determinants of Health) assessments and interventions performed:   ? ?Advanced Directives Status: Not addressed in this encounter. ? ?Care Plan ? ?Allergies  ?Allergen Reactions  ? Ace Inhibitors Cough  ? ? ?Outpatient Encounter Medications as of 07/29/2021  ?Medication Sig  ? albuterol (PROVENTIL) (2.5 MG/3ML) 0.083% nebulizer solution Take 2.5 mg by nebulization every 6 (six) hours as needed for wheezing or shortness of breath.  ? diazepam (VALIUM) 5 MG tablet Take 1 tablet (5 mg total) by mouth every 6 (six) hours as needed for muscle spasms.  ? ferrous sulfate 325 (65 FE) MG tablet Take 1 tablet (325 mg total) by  mouth daily.  ? Fluticasone-Umeclidin-Vilant (TRELEGY ELLIPTA) 100-62.5-25 MCG/INH AEPB Inhale 1 puff into the lungs daily.  ? folic acid (FOLVITE) 1 MG tablet TAKE 1 TABLET BY MOUTH EVERY DAY (Patient taking differently: Take 1 mg by mouth daily. )  ? furosemide (LASIX) 40 MG tablet Take 1 tablet (40 mg total) by mouth daily.  ? gabapentin (NEURONTIN) 600 MG tablet Take 1,200 mg by mouth 2 (two) times daily.  ? HYDROcodone-acetaminophen (NORCO) 5-325 MG tablet Take 1 tablet by mouth every 4 (four) hours as needed for moderate pain or severe pain.  ? hydrOXYzine (ATARAX/VISTARIL) 10 MG tablet Take 10 mg by mouth 3 (three) times daily as needed.  ? ipratropium-albuterol (DUONEB) 0.5-2.5 (3) MG/3ML SOLN Take 3 mLs by nebulization every 6 (six) hours as needed (shortness of breath).  ? levothyroxine (SYNTHROID) 100 MCG tablet Take 100 mcg by mouth daily.  ? levothyroxine (SYNTHROID) 75 MCG tablet Take 75 mcg by mouth daily.  ? losartan (COZAAR) 100 MG tablet Take 100 mg by mouth daily.  ? memantine (NAMENDA) 5 MG tablet Take 5 mg by mouth daily.  ? metFORMIN (GLUCOPHAGE-XR) 500 MG 24 hr tablet Take 500 mg by mouth 2 (two) times daily.   ? NYSTATIN powder Apply 1 g topically 2 (two) times daily as needed (rash).   ? pantoprazole (PROTONIX) 40 MG tablet Take 40 mg by mouth daily.  ? pioglitazone (ACTOS) 30 MG tablet Take 30 mg by mouth daily.   ? potassium chloride 20 MEQ TBCR Take 20 mEq by mouth daily.  ? predniSONE (DELTASONE) 10 MG tablet Take q day 6,5,4,3,2,1  ? simvastatin (ZOCOR) 40 MG tablet Take 40 mg by mouth daily at  6 PM.   ? venlafaxine XR (EFFEXOR-XR) 150 MG 24 hr capsule Take 150 mg by mouth every morning.  ? ziprasidone (GEODON) 20 MG capsule Take 20 mg by mouth every morning.  ? ziprasidone (GEODON) 40 MG capsule Take 40 mg by mouth at bedtime.   ? ?No facility-administered encounter medications on file as of 07/29/2021.  ? ? ?Patient Active Problem List  ? Diagnosis Date Noted  ? Acute respiratory  failure with hypoxia ([place]) 05/10/2019  ? Sepsis due to pneumonia ([place]) 03/07/2019  ? Acute lower UTI 03/07/2019  ? Acute on chronic respiratory failure with hypoxia ([place]) 03/07/2019  ? COPD with acute exacerbation ([place]) 03/07/2019  ? Acute metabolic encephalopathy 99991111  ? Transient hypotension 03/07/2019  ? AKI (acute kidney injury) ([place]) 03/07/2019  ? Rheumatoid arthritis with rheumatoid factor of multiple sites without organ or systems involvement ([place]) 07/19/2016  ? High risk medications (not anticoagulants) long-term use 07/19/2016  ? Osteoarthritis of both knees 07/19/2016  ? Osteoarthritis of both hands 07/19/2016  ? Primary osteoarthritis of both feet 07/19/2016  ? ? ?Conditions to be addressed/monitored:  Chronic Obstructive Pulmonary Disease with Acute Exacerbation, Acute Respiratory Failure with Hypoxia, Acute Metabolic Encephalopathy, Acute Kidney Injury, and Dementia.  Level of Care Concerns, ADL/IADL Limitations, Mental Health Concerns, Family and Relationship Dysfunction, Limited Access to Caregiver, Cognitive Deficits, Memory Deficits, and Lacks Knowledge of [employer]. ? ?Care Plan : LCSW Plan of Care  ?Updates made by Karla Gaines, LCSW since 07/29/2021 12:00 AM  ?  ? ?Problem: Obtain Palliative Care Services to Keep Pain Under Control.   ?Priority: High  ?  ? ?Goal: Obtain Palliative Care Services to Keep Pain Under Control.   ?Start Date: 05/25/2021  ?Expected End Date: 08/12/2021  ?This Visit's Progress: On track  ?Recent Progress: On track  ?Priority: High  ?Note:   ?Current Barriers: ?Chronic Care Management Needs in Patient with Chronic Obstructive Pulmonary Disease with Acute Exacerbation, Acute Respiratory Failure with Hypoxia, Acute Metabolic Encephalopathy, Acute Kidney Injury, and Dementia. ?Clinical Goals: ?Patient's daughter will work with LCSW, in an effort to obtain palliative care services for patient in the home, to help reduce and manage pain  symptoms. ?Interventions: ?Collaboration with Primary Care Physician, Dr. Maryella Clark regarding development and update of comprehensive plan of care as evidenced by provider attestation and co-signature. ?Inter-disciplinary care team collaboration (see longitudinal plan of care). ?Clinical Interventions: ?Mindfulness Meditation Strategies, Relaxation Techniques, and Deep Breathing Exercises Reinforced Daily. ?Active Listening/Reflection Utilized. ?Emotional Support Provided. ?Verbalization of Feelings Encouraged. ?Problem Solving/Task-Centered Solutions Developed. ?Caregiver Stress Acknowledged. ?Patient Goals/Self-Care Activities: ?Daughter will continue to work with LCSW, on a bi-weekly basis, until [employer] are in place, through [employer].  ?Continue to work with Kathi Ludwig, Licensed Clinical Social Worker with Manufacturing engineer, in an effort to [place] in the home.   ?Continue to work with Charlann Boxer, Nurse Practitioner with Manufacturing engineer, in an effort to [place] in the home.  ?Daughter will contact LCSW directly (# [phone]), if she has questions, needs assistance, or if additional social work needs are identified between now and our next scheduled telephone outreach call. ?Follow-Up Plan:  08/12/2021 at 4:15 pm ?  ?Nat Christen, BSW, MSW, LCSW  ?Licensed Clinical Social Worker  ?[facility]  ?Mailing Address-[address] [place], [place] [address] ?Physical Address-[address], [place], [place] [address] ?Toll Free Main # [phone] ?Fax #  [phone] ?Cell # [phone] ?[email or web address] ? ? ? ? ? ?  ?

## 2021-07-30 DIAGNOSIS — J441 Chronic obstructive pulmonary disease with (acute) exacerbation: Secondary | ICD-10-CM | POA: Diagnosis not present

## 2021-07-30 DIAGNOSIS — R0902 Hypoxemia: Secondary | ICD-10-CM | POA: Diagnosis not present

## 2021-08-12 ENCOUNTER — Other Ambulatory Visit: Payer: Self-pay | Admitting: *Deleted

## 2021-08-12 DIAGNOSIS — J449 Chronic obstructive pulmonary disease, unspecified: Secondary | ICD-10-CM | POA: Diagnosis not present

## 2021-08-13 NOTE — Patient Outreach (Signed)
Care Management ?Clinical Social Work Note ? ?08/13/2021 ?Name: Karla Clark MRN: [id number] DOB: 1945/12/21 ? ?Karla Clark is a 76 y.o. year old female who is a primary care patient of Karla Rakes, MD.  The Care Management team was consulted for assistance with chronic disease management and coordination needs. ? ?Engaged with patient's daughter by telephone for follow up visit in response to provider referral for social work chronic care management and care coordination services ? ?Consent to Services:  ?Karla Clark was given information about Care Management services today including:  ?Care Management services includes personalized support from designated clinical staff supervised by her physician, including individualized plan of care and coordination with other care providers ?24/7 contact phone numbers for assistance for urgent and routine care needs. ?The patient may stop case management services at any time by phone call to the office staff. ? ?Patient agreed to services and consent obtained.  ? ?Assessment: Review of patient past medical history, allergies, medications, and health status, including review of relevant consultants reports was performed today as part of a comprehensive evaluation and provision of chronic care management and care coordination services. ? ?SDOH (Social Determinants of Health) assessments and interventions performed:   ? ?Advanced Directives Status: Not addressed in this encounter. ? ?Care Plan ? ?Allergies  ?Allergen Reactions  ? Ace Inhibitors Cough  ? ? ?Outpatient Encounter Medications as of 08/12/2021  ?Medication Sig  ? albuterol (PROVENTIL) (2.5 MG/3ML) 0.083% nebulizer solution Take 2.5 mg by nebulization every 6 (six) hours as needed for wheezing or shortness of breath.  ? diazepam (VALIUM) 5 MG tablet Take 1 tablet (5 mg total) by mouth every 6 (six) hours as needed for muscle spasms.  ? ferrous sulfate 325 (65 FE) MG tablet Take 1 tablet (325 mg total) by  mouth daily.  ? Fluticasone-Umeclidin-Vilant (TRELEGY ELLIPTA) 100-62.5-25 MCG/INH AEPB Inhale 1 puff into the lungs daily.  ? folic acid (FOLVITE) 1 MG tablet TAKE 1 TABLET BY MOUTH EVERY DAY (Patient taking differently: Take 1 mg by mouth daily. )  ? furosemide (LASIX) 40 MG tablet Take 1 tablet (40 mg total) by mouth daily.  ? gabapentin (NEURONTIN) 600 MG tablet Take 1,200 mg by mouth 2 (two) times daily.  ? HYDROcodone-acetaminophen (NORCO) 5-325 MG tablet Take 1 tablet by mouth every 4 (four) hours as needed for moderate pain or severe pain.  ? hydrOXYzine (ATARAX/VISTARIL) 10 MG tablet Take 10 mg by mouth 3 (three) times daily as needed.  ? ipratropium-albuterol (DUONEB) 0.5-2.5 (3) MG/3ML SOLN Take 3 mLs by nebulization every 6 (six) hours as needed (shortness of breath).  ? levothyroxine (SYNTHROID) 100 MCG tablet Take 100 mcg by mouth daily.  ? levothyroxine (SYNTHROID) 75 MCG tablet Take 75 mcg by mouth daily.  ? losartan (COZAAR) 100 MG tablet Take 100 mg by mouth daily.  ? memantine (NAMENDA) 5 MG tablet Take 5 mg by mouth daily.  ? metFORMIN (GLUCOPHAGE-XR) 500 MG 24 hr tablet Take 500 mg by mouth 2 (two) times daily.   ? NYSTATIN powder Apply 1 g topically 2 (two) times daily as needed (rash).   ? pantoprazole (PROTONIX) 40 MG tablet Take 40 mg by mouth daily.  ? pioglitazone (ACTOS) 30 MG tablet Take 30 mg by mouth daily.   ? potassium chloride 20 MEQ TBCR Take 20 mEq by mouth daily.  ? predniSONE (DELTASONE) 10 MG tablet Take q day 6,5,4,3,2,1  ? simvastatin (ZOCOR) 40 MG tablet Take 40 mg by mouth daily at  6 PM.   ? venlafaxine XR (EFFEXOR-XR) 150 MG 24 hr capsule Take 150 mg by mouth every morning.  ? ziprasidone (GEODON) 20 MG capsule Take 20 mg by mouth every morning.  ? ziprasidone (GEODON) 40 MG capsule Take 40 mg by mouth at bedtime.   ? ?No facility-administered encounter medications on file as of 08/12/2021.  ? ? ?Patient Active Problem List  ? Diagnosis Date Noted  ? Acute respiratory  failure with hypoxia (HCC) 05/10/2019  ? Sepsis due to pneumonia (HCC) 03/07/2019  ? Acute lower UTI 03/07/2019  ? Acute on chronic respiratory failure with hypoxia (HCC) 03/07/2019  ? COPD with acute exacerbation (HCC) 03/07/2019  ? Acute metabolic encephalopathy 03/07/2019  ? Transient hypotension 03/07/2019  ? AKI (acute kidney injury) (HCC) 03/07/2019  ? Rheumatoid arthritis with rheumatoid factor of multiple sites without organ or systems involvement (HCC) 07/19/2016  ? High risk medications (not anticoagulants) long-term use 07/19/2016  ? Osteoarthritis of both knees 07/19/2016  ? Osteoarthritis of both hands 07/19/2016  ? Primary osteoarthritis of both feet 07/19/2016  ? ? ?Conditions to be addressed/monitored:  Chronic Obstructive Pulmonary Disease, Dementia, and Anxiety.  Limited Social Support, Level of Care Concerns, ADL/IADL Limitations, Mental Health Concerns, Family and Relationship Dysfunction, Limited Access to Caregiver, Cognitive Deficits, Memory Deficits, and Lacks Knowledge of [facility]. ? ?Care Plan : LCSW Plan of Care  ?Updates made by Karolee Stamps, LCSW since 08/13/2021 12:00 AM  ?  ? ?Problem: Obtain Palliative Care Services to Keep Pain Under Control.   ?Priority: High  ?  ? ?Goal: Obtain Palliative Care Services to Keep Pain Under Control.   ?Start Date: 05/25/2021  ?Expected End Date: 08/27/2021  ?This Visit's Progress: On track  ?Recent Progress: On track  ?Priority: High  ?Note:   ?Current Barriers: ?Chronic Care Management Needs in Patient with Chronic Obstructive Pulmonary Disease with Acute Exacerbation, Acute Respiratory Failure with Hypoxia, Acute Metabolic Encephalopathy, Acute Kidney Injury, and Dementia. ?Clinical Goals: ?Patient's daughter will work with LCSW, in an effort to obtain palliative care services for patient in the home, to help reduce and manage pain symptoms. ?Interventions: ?Collaboration with Primary Care Physician, Dr. Charlott Clark regarding  development and update of comprehensive plan of care as evidenced by provider attestation and co-signature. ?Inter-disciplinary care team collaboration (see longitudinal plan of care). ?Clinical Interventions: ?Mindfulness Meditation Strategies, Relaxation Techniques, and Deep Breathing Exercises Reinforced Daily. ?Active Listening/Reflection Utilized. ?Emotional Support Provided. ?Verbalization of Feelings Encouraged. ?Problem Solving/Task-Centered Solutions Developed. ?Caregiver Stress Acknowledged. ?Patient Goals/Self-Care Activities: ?Daughter will continue to work with LCSW, on a bi-weekly basis, until [employer] are in place, through [employer].  ?Continue to work with Merilynn Finland, Licensed Clinical Social Worker with Civil engineer, contracting, in an effort to establish Community Palliative Care Services in the home.   ?Continue to work with Gwinda Maine, Nurse Practitioner with Civil engineer, contracting, in an effort to establish [facility] in the home.  ?Daughter will attend court on 08/19/2021, in an effort to keep your son incarcerated, to protect you from his anger, rage, psychoses, and paranoia, while suffering from mental illness and polysubstance abuse issues. ?Daughter has enlisted your niece and nephew to assist you with ADL's, while they are currently residing in the home. ?Daughter will contact LCSW directly (# [phone]), if she has questions, needs assistance, or if additional social work needs are identified between now and our next scheduled telephone outreach call. ?Follow-Up Plan:  08/27/2021 at 10:30 am ? ?  ?  Danford Bad, BSW, MSW, LCSW  ?Licensed Clinical Social Worker  ?[employer] Customer service manager Care Management ?[facility]  ?Mailing Address-[address] [place], [place] [address] ?Physical Address-[address] [place], [place], [place] [address] ?Toll Free Main # [phone] ?Fax # [phone] ?Cell #  [phone] ?[email or web address] ? ? ? ? ? ?  ?

## 2021-08-24 DIAGNOSIS — I5030 Unspecified diastolic (congestive) heart failure: Secondary | ICD-10-CM | POA: Diagnosis not present

## 2021-08-24 DIAGNOSIS — J9611 Chronic respiratory failure with hypoxia: Secondary | ICD-10-CM | POA: Diagnosis not present

## 2021-08-24 DIAGNOSIS — J449 Chronic obstructive pulmonary disease, unspecified: Secondary | ICD-10-CM | POA: Diagnosis not present

## 2021-08-27 ENCOUNTER — Other Ambulatory Visit: Payer: Self-pay | Admitting: *Deleted

## 2021-08-27 ENCOUNTER — Telehealth: Payer: Self-pay | Admitting: Student

## 2021-08-27 NOTE — Patient Outreach (Signed)
Care Management ?Clinical Social Work Note ? ?08/27/2021 ?Name: Karla Clark MRN: [id number] DOB: April 21, 1946 ? ?Karla Clark is a 76 y.o. year old female who is a primary care patient of Karla Rakes, MD.  The Care Management team was consulted for assistance with chronic disease management and coordination needs. ? ?Engaged with patient's daughter by telephone for follow up visit in response to provider referral for social work chronic care management and care coordination services ? ?Consent to Services:  ?Ms. Vargo was given information about Care Management services today including:  ?Care Management services includes personalized support from designated clinical staff supervised by her physician, including individualized plan of care and coordination with other care providers ?24/7 contact phone numbers for assistance for urgent and routine care needs. ?The patient may stop case management services at any time by phone call to the office staff. ? ?Patient agreed to services and consent obtained.  ? ?Assessment: Review of patient past medical history, allergies, medications, and health status, including review of relevant consultants reports was performed today as part of a comprehensive evaluation and provision of chronic care management and care coordination services. ? ?SDOH (Social Determinants of Health) assessments and interventions performed:   ? ?Advanced Directives Status: Not addressed in this encounter. ? ?Care Plan ? ?Allergies  ?Allergen Reactions  ? Ace Inhibitors Cough  ? ? ?Outpatient Encounter Medications as of 08/27/2021  ?Medication Sig  ? albuterol (PROVENTIL) (2.5 MG/3ML) 0.083% nebulizer solution Take 2.5 mg by nebulization every 6 (six) hours as needed for wheezing or shortness of breath.  ? diazepam (VALIUM) 5 MG tablet Take 1 tablet (5 mg total) by mouth every 6 (six) hours as needed for muscle spasms.  ? ferrous sulfate 325 (65 FE) MG tablet Take 1 tablet (325 mg total) by  mouth daily.  ? Fluticasone-Umeclidin-Vilant (TRELEGY ELLIPTA) 100-62.5-25 MCG/INH AEPB Inhale 1 puff into the lungs daily.  ? folic acid (FOLVITE) 1 MG tablet TAKE 1 TABLET BY MOUTH EVERY DAY (Patient taking differently: Take 1 mg by mouth daily. )  ? furosemide (LASIX) 40 MG tablet Take 1 tablet (40 mg total) by mouth daily.  ? gabapentin (NEURONTIN) 600 MG tablet Take 1,200 mg by mouth 2 (two) times daily.  ? HYDROcodone-acetaminophen (NORCO) 5-325 MG tablet Take 1 tablet by mouth every 4 (four) hours as needed for moderate pain or severe pain.  ? hydrOXYzine (ATARAX/VISTARIL) 10 MG tablet Take 10 mg by mouth 3 (three) times daily as needed.  ? ipratropium-albuterol (DUONEB) 0.5-2.5 (3) MG/3ML SOLN Take 3 mLs by nebulization every 6 (six) hours as needed (shortness of breath).  ? levothyroxine (SYNTHROID) 100 MCG tablet Take 100 mcg by mouth daily.  ? levothyroxine (SYNTHROID) 75 MCG tablet Take 75 mcg by mouth daily.  ? losartan (COZAAR) 100 MG tablet Take 100 mg by mouth daily.  ? memantine (NAMENDA) 5 MG tablet Take 5 mg by mouth daily.  ? metFORMIN (GLUCOPHAGE-XR) 500 MG 24 hr tablet Take 500 mg by mouth 2 (two) times daily.   ? NYSTATIN powder Apply 1 g topically 2 (two) times daily as needed (rash).   ? pantoprazole (PROTONIX) 40 MG tablet Take 40 mg by mouth daily.  ? pioglitazone (ACTOS) 30 MG tablet Take 30 mg by mouth daily.   ? potassium chloride 20 MEQ TBCR Take 20 mEq by mouth daily.  ? predniSONE (DELTASONE) 10 MG tablet Take q day 6,5,4,3,2,1  ? simvastatin (ZOCOR) 40 MG tablet Take 40 mg by mouth daily at  6 PM.   ? venlafaxine XR (EFFEXOR-XR) 150 MG 24 hr capsule Take 150 mg by mouth every morning.  ? ziprasidone (GEODON) 20 MG capsule Take 20 mg by mouth every morning.  ? ziprasidone (GEODON) 40 MG capsule Take 40 mg by mouth at bedtime.   ? ?No facility-administered encounter medications on file as of 08/27/2021.  ? ? ?Patient Active Problem List  ? Diagnosis Date Noted  ? Acute respiratory  failure with hypoxia (HCC) 05/10/2019  ? Sepsis due to pneumonia (HCC) 03/07/2019  ? Acute lower UTI 03/07/2019  ? Acute on chronic respiratory failure with hypoxia (HCC) 03/07/2019  ? COPD with acute exacerbation (HCC) 03/07/2019  ? Acute metabolic encephalopathy 03/07/2019  ? Transient hypotension 03/07/2019  ? AKI (acute kidney injury) (HCC) 03/07/2019  ? Rheumatoid arthritis with rheumatoid factor of multiple sites without organ or systems involvement (HCC) 07/19/2016  ? High risk medications (not anticoagulants) long-term use 07/19/2016  ? Osteoarthritis of both knees 07/19/2016  ? Osteoarthritis of both hands 07/19/2016  ? Primary osteoarthritis of both feet 07/19/2016  ? ? ?Conditions to be addressed/monitored: Anxiety, Dementia, and Caregiver Stress.  Level of Care Concerns, ADL/IADL Limitations, Mental Health Concerns, Family and Relationship Dysfunction, Social Isolation, Limited Access to Caregiver, Cognitive Deficits, Memory Deficits, and Lacks Knowledge of [facility]. ? ?Care Plan : LCSW Plan of Care  ?Updates made by Karolee StampsSaporito, Reniya Mcclees D, LCSW since 08/27/2021 12:00 AM  ?  ? ?Problem: Obtain Palliative Care Services to Keep Pain Under Control.   ?Priority: High  ?  ? ?Goal: Obtain Palliative Care Services to Keep Pain Under Control.   ?Start Date: 05/25/2021  ?Expected End Date: 09/11/2021  ?This Visit's Progress: On track  ?Recent Progress: On track  ?Priority: High  ?Note:   ?Current Barriers: ?Chronic Care Management Needs in Patient with Chronic Obstructive Pulmonary Disease with Acute Exacerbation, Acute Respiratory Failure with Hypoxia, Acute Metabolic Encephalopathy, Acute Kidney Injury, and Dementia. ?Clinical Goals: ?Patient's daughter will work with LCSW, in an effort to obtain palliative care services for patient in the home, to help reduce and manage pain symptoms. ?Interventions: ?Collaboration with Primary Care Physician, Dr. Charlott RakesFrancisco Hodges regarding development and update of  comprehensive plan of care as evidenced by provider attestation and co-signature. ?Inter-disciplinary care team collaboration (see longitudinal plan of care). ?Clinical Interventions: ?Mindfulness Meditation Strategies, Relaxation Techniques, and Deep Breathing Exercises Reinforced Daily. ?Active Listening/Reflection Utilized. ?Emotional Support Provided. ?Verbalization of Feelings Encouraged. ?Problem Solving/Task-Centered Solutions Developed. ?Caregiver Stress Acknowledged. ?Patient Goals/Self-Care Activities: ?Daughter will continue to work with LCSW, on a bi-weekly basis, until [facility] are in place, through [facility].  ?Continue to receive Palliative Care Services through Merilynn FinlandLibby Akers, Licensed Clinical Social Worker with Civil engineer, [facility].   ?Continue to received Palliative Care Services [facility], Nurse Practitioner with [facility] engineer, [facility].    ?Daughter attended court hearing on 08/19/2021, and was able to ensure that your son remain incarcerated, to protect you from his anger, rage, psychoses, and paranoia, while suffering from mental illness and polysubstance abuse issues. ?Continue to receive 24 hour care and supervision in the home, through assistance offered by two daughters, granddaughter, and grandson. ?Daughter will contact LCSW directly (# [phone]), if she has questions, needs assistance, or if additional social work needs are identified between now and our next scheduled telephone outreach call. ?Follow-Up Plan:  09/11/2021 at 9:15 am ? ?  ?Danford BadJoanna Chukwudi Ewen, BSW, MSW, LCSW  ?Licensed Clinical Social Worker  ?[facility] Customer service [facility]Care Network Care Management ?[facility]  ?Mailing Address-[address] [place], [place] [address] ?Physical Address-[address] [place], [place], [place] [address] ?Toll Free Main # [phone] ?Fax # [phone] ?Cell # [phone] ?[email or web address] ? ? ? ? ? ?  ?

## 2021-08-27 NOTE — Telephone Encounter (Signed)
Palliative NP spoke with patient's daughter to schedule in home follow up visit. Visit scheduled for 09/02/21 @ 10am. Patient still experiencing thrush. [facility]  ?

## 2021-08-30 DIAGNOSIS — J441 Chronic obstructive pulmonary disease with (acute) exacerbation: Secondary | ICD-10-CM | POA: Diagnosis not present

## 2021-08-30 DIAGNOSIS — R0902 Hypoxemia: Secondary | ICD-10-CM | POA: Diagnosis not present

## 2021-09-01 ENCOUNTER — Other Ambulatory Visit: Payer: Self-pay | Admitting: Student

## 2021-09-02 ENCOUNTER — Other Ambulatory Visit: Payer: Self-pay | Admitting: Student

## 2021-09-02 DIAGNOSIS — J069 Acute upper respiratory infection, unspecified: Secondary | ICD-10-CM

## 2021-09-02 DIAGNOSIS — B370 Candidal stomatitis: Secondary | ICD-10-CM | POA: Diagnosis not present

## 2021-09-02 DIAGNOSIS — Z515 Encounter for palliative care: Secondary | ICD-10-CM

## 2021-09-02 DIAGNOSIS — F0393 Unspecified dementia, unspecified severity, with mood disturbance: Secondary | ICD-10-CM

## 2021-09-02 DIAGNOSIS — J449 Chronic obstructive pulmonary disease, unspecified: Secondary | ICD-10-CM | POA: Diagnosis not present

## 2021-09-02 NOTE — Progress Notes (Signed)
? ? ?Manufacturing engineer ?Community Palliative Care Consult Note ?Telephone: [phone]  ?Fax: [phone]  ? ? ?Date of encounter: 09/02/21 ?10:30am ?PATIENT NAME: Karla Clark ?[place] [address]   ?[phone] (home)  ?DOB: May 06, 1945 ?MRN: [id number] ?PRIMARY CARE PROVIDER:    ?Maryella Shivers, MD,  ?[address] ?[place] [address] ?[phone] ? ?REFERRING PROVIDER:   ?Maryella Shivers, MD ?[address] ?[place],  [place] [address] ?[phone] ? ?RESPONSIBLE PARTY:    ?Contact Information   ? ? Name Relation Home Work Mobile  ? Charles,Leisa Daughter [phone]  [phone]  ? ?  ? ? ? ?I met face to face with patient and family in the home. Palliative Care was asked to follow this patient by consultation request of  Maryella Shivers, MD to address advance care planning and complex medical decision making. This is a follow up visit. ? ?                                 ASSESSMENT AND PLAN / RECOMMENDATIONS:  ? ?Advance Care Planning/Goals of Care: Goals include to maximize quality of life and symptom management. Patient/health care surrogate gave his/her permission to discuss. ?CODE STATUS: DNR ? ?Education provided on Palliative Medicine. Patient with further decline cognitively and functionally. Will discuss with hospice eligibility with medical directors.  ? ?Symptom Management/Plan: ? ?COPD- shortness of breath with exertion. Continue alformoterol and budesonide nebulizer's as directed. Patient was unable to get Yupelri covered per daughter.  ? ?URI-worsening symptoms x 1 week. Start Z-pack as directed, Mucinex 600 mg BID, loratadine 10 mg daily.  ? ?Thrush-patient continues to have thrush. She has received diflucan in past week. Will order nystatin swish and swallow again x 7 days. Start yogurt BID and continue to rinse mouth after using inhalers, nebulizer treatments.  ? ?Dementia-continue to reorient and redirect as needed.  Monitor for worsening dysphagia, encourage soft diet. Keep head of bed elevated. Monitor for falls safety. Continue memantine as directed.  ? ?Follow up Palliative Care Visit: Palliative care will continue to follow for complex medical decision making, advance care planning, and clarification of goals. Return in 2-4 weeks or prn. ? ? ?This visit was coded based on medical decision making (MDM). ? ?PPS: 30% ? ?HOSPICE ELIGIBILITY/DIAGNOSIS: TBD ? ?Chief Complaint: Palliative Medicine follow up visit. ? ?HISTORY OF PRESENT ILLNESS:  Karla Clark is a 76 y.o. year old female  with COPD, dementia, type 2 diabetes, chronic pain, rheumatoid arthritis, hypothyroidism, hypertension, Bipolar 1, schizophrenia.   ?  ?Patient resides at home with family. Patient reports feeling ill, she has had a cough for a week and has lost her voice. She is coughing more with eating and drinking. She continues to have sores, thrush. She endorses shortness of breath with any exertion. Her appetite continues to decline. She is mostly snacking during the day; her clothes are fitting looser and daughter states she has gone from a XL to L in pull-ups. She is mostly in bed now; when she does get up, requires assist x 2. She is more withdrawn, speaking less. Daughter also reports increased confusion, delusions. ? ?History obtained from review of EMR, discussion with primary team, and interview with family, facility staff/caregiver and/or Ms. Decandia.  ?I reviewed available labs, medications, imaging, studies and related documents from the EMR.  Records reviewed and summarized above.  ? ?ROS ? ?  General: NAD ?EYES: denies vision changes ?ENMT: dysphagia ?Cardiovascular: denies chest pain, DOE ?Pulmonary: +cough ?Abdomen: denies constipation ?GU: denies dysuria, endorses incontinence of urine ?MSK: increased weakness,  no falls reported ?Skin: lesions to scalp  ?Neurological: denies pain, sleep varies ?Psych: less engaged,  withdrawn ?Heme/lymph/immuno: denies bruises, abnormal bleeding ? ?Physical Exam: ?Pulse 88, resp 20, b/p 120/72, sats 94% on room air ?Constitutional: NAD, ill appearing ?General: frail appearing, thin ?EYES: anicteric sclera, lids intact, no discharge  ?ENMT: intact hearing, lesions on tongue, +hoarseness ?CV: S1S2, RRR, no LE edema ?Pulmonary: scattered rhonchi, faint wheeze, no increased work of breathing, non productive cough, room air ?Abdomen: normo-active BS + 4 quadrants, soft and non tender, no ascites ?GU: deferred ?MSK: + sarcopenia, contractures to bilateral hands, toes, non- ambulatory ?Skin: cool and dry, pale, lesions to scalp  ?Neuro: + generalized weakness, A & O to person ?Psych: non-anxious affect, flat affect ?Hem/lymph/immuno: no widespread bruising ? ? ?Thank you for the opportunity to participate in the care of Ms. Vise.  The palliative care team will continue to follow. Please call our office at [phone] if we can be of additional assistance.  ? ?Ezekiel Slocumb, NP  ? ?COVID-19 PATIENT SCREENING TOOL ?Asked and negative response unless otherwise noted:  ? ?Have you had symptoms of covid, tested positive or been in contact with someone with symptoms/positive test in the past 5-10 days? No ? ?

## 2021-09-04 ENCOUNTER — Telehealth: Payer: Self-pay | Admitting: Student

## 2021-09-04 NOTE — Telephone Encounter (Signed)
Spoke with patient's daughter regarding approval to proceed with hospice evaluation. Will follow up with PCP to see if they will serve as hospice attending.  ?

## 2021-09-07 ENCOUNTER — Telehealth: Payer: Self-pay | Admitting: Student

## 2021-09-07 NOTE — Telephone Encounter (Signed)
Palliative NP left message earlier regarding hospice evaluation order. Spoke with Candise Bowens at [facility] office this afternoon and she will check with PCP for hospice order and if [name] will serve as the hospice attending.  ?

## 2021-09-09 DIAGNOSIS — J449 Chronic obstructive pulmonary disease, unspecified: Secondary | ICD-10-CM | POA: Diagnosis not present

## 2021-09-09 DIAGNOSIS — Z7189 Other specified counseling: Secondary | ICD-10-CM | POA: Diagnosis not present

## 2021-09-09 DIAGNOSIS — Z7409 Other reduced mobility: Secondary | ICD-10-CM | POA: Diagnosis not present

## 2021-09-10 DIAGNOSIS — J449 Chronic obstructive pulmonary disease, unspecified: Secondary | ICD-10-CM | POA: Diagnosis not present

## 2021-09-11 ENCOUNTER — Ambulatory Visit: Payer: Self-pay | Admitting: *Deleted

## 2021-09-12 ENCOUNTER — Telehealth: Payer: Self-pay | Admitting: *Deleted

## 2021-09-12 NOTE — Patient Outreach (Signed)
?  Care Management  ? ?Follow Up Note ? ? ?09/12/2021 ? ?Name: AREANNA STONIER MRN: [id number] DOB: Nov 08, 1945 ? ?Referred By: Charlott Rakes, MD ? ?Reason for Referral:  Care Coordination Needs in Patient with Requiring Palliative Care Services to Keep Pain Under Control. ? ?An unsuccessful telephone outreach was attempted today. The patient was referred to the case management team for assistance with care management and care coordination. A HIPAA compliant message was left on voicemail for patient's daughter, including contact information, encouraging her to return LCSW's call at her earliest convenience.  LCSW will make a second telephone follow-up outreach call attempt within the next 5-7 business days, if a return call is not received from patient's daughter in the meantime. ? ?Follow-Up Plan: Telephone follow-up appointment with care management team member scheduled for:  09/21/2021 at 10:00 am. ? ?Danford Bad, BSW, MSW, LCSW  ?Licensed Clinical Social Worker  ?[employer] Customer service manager Care Management ?[facility]  ?Mailing Address-[address] [place], [place] [address] ?Physical Address-[address] [place], [place], [place] [address] ?Toll Free Main # [phone] ?Fax # [phone] ?Cell # [phone] ?[email or web address] ? ? ? ? ? ?  ? ?

## 2021-09-21 ENCOUNTER — Ambulatory Visit: Payer: Self-pay | Admitting: *Deleted

## 2021-09-21 ENCOUNTER — Telehealth: Payer: Self-pay | Admitting: *Deleted

## 2021-09-21 NOTE — Patient Outreach (Signed)
  Care Management   Follow Up Note   09/21/2021  Name: LIBRADA CASTRONOVO MRN: [id number] DOB: January 12, 1946  Referred By: Charlott Rakes, MD  Reason for Referral:  Care Coordination for Palliative Care Services to Keep Pain Under Control.  A second unsuccessful telephone outreach was attempted today. The patient was referred to the case management team for assistance with care management and care coordination. A HIPAA compliant message was left on voicemail for patient's daughter and primary caregiver, Maureen Chatters, providing contact information, encouraging her to return LCSW's call at her earliest convenience.  LCSW will make third telephone follow-up outreach call attempt within the next 7-10 business days, if a return call is not received from Ms. Melody in the meantime.  Follow-Up Plan:  10/05/2021 at 9:15 am  Danford Bad, BSW, MSW, LCSW  Licensed Clinical Social Worker  [facility]  [address]ailing [place] [address] [place], [place] [address] Physical Address[address], [place], [place] [address] Toll Free Main # [phone] Fax # [phone] Cell # [phone] [email or web address]

## 2021-09-29 DIAGNOSIS — J441 Chronic obstructive pulmonary disease with (acute) exacerbation: Secondary | ICD-10-CM | POA: Diagnosis not present

## 2021-09-29 DIAGNOSIS — R0902 Hypoxemia: Secondary | ICD-10-CM | POA: Diagnosis not present

## 2021-10-05 ENCOUNTER — Ambulatory Visit: Payer: Self-pay | Admitting: *Deleted

## 2021-10-05 ENCOUNTER — Telehealth: Payer: Self-pay | Admitting: *Deleted

## 2021-10-05 ENCOUNTER — Encounter: Payer: Self-pay | Admitting: *Deleted

## 2021-10-05 NOTE — Patient Outreach (Signed)
  Care Management   Follow Up Note   10/05/2021  Name: Karla Clark MRN: [id number] DOB: [birth year]  Referred By: Maryella Shivers, MD  Reason for Referral: Care Coordination for Palliative Care Services to Keep Pain Under Control.  Third unsuccessful telephone outreach was attempted today. The patient was referred to the case management team for assistance with care management and care coordination. The patient's primary care provider has been notified of our unsuccessful attempts to make or maintain contact with the patient. The care management team is pleased to engage with this patient at any time in the future should he/she be interested in assistance from the care management team. LCSW left a HIPAA compliant message on voicemail for patient's daughter, providing contact information, encouraging her to return LCSW's call at her earliest convenience.  LCSW will make a fourth and final telephone outreach call attempt within the next 30 days, if a return call is not received from patient's daughter in the meantime.  Unsuccessful Outreach Letter has been mailed to patient's home.  Follow-Up Plan:  11/02/2021 at 10:45 am   Nat Christen, BSW, MSW, [place]  Licensed Clinical Social Worker  [facility]  Mailing [place] [address], [place], [place] [address] Physical Address[address], [place], [place] [address] Toll Free Main # [phone] Fax # [phone] Cell # [phone] [email or web address]

## 2021-11-02 ENCOUNTER — Encounter: Payer: Self-pay | Admitting: *Deleted

## 2021-11-02 ENCOUNTER — Ambulatory Visit: Payer: Self-pay | Admitting: *Deleted

## 2021-11-02 ENCOUNTER — Telehealth: Payer: Self-pay | Admitting: *Deleted

## 2021-11-02 NOTE — Patient Outreach (Signed)
  Care Management   Follow-Up Note   11/02/2021  Name: MAURINE MOWBRAY   MRN: [id number]       DOB: 01-21-1946   Referred By: Charlott Rakes, MD   Reason for Referral: Care Coordination for Palliative Care Services to Keep Pain Under Control.   Fourth and final unsuccessful telephone outreach was attempted today with patient's daughter.  The patient was referred to the case management team for assistance with care management and care coordination. The patient's primary care provider has been notified of our unsuccessful attempts to maintain contact with the patient. The care management team is pleased to engage with this patient at any time in the future should she be interested in assistance from the care management team.  LCSW left a HIPAA compliant message on voicemail for patient's daughter, providing contact information, encouraging her to return LCSW's call at her earliest convenience if she is interested in receiving social work services.  Case Closure Letter was mailed to patient's home, as well as to patient's Primary Care Provider, Dr. Charlott Rakes.     No Follow-Up Required.   Danford Bad, BSW, MSW, LCSW  Licensed [facility] manager, [facility]  [address]ailing [place] [address] [place], [place] [address] Physical Address-[address] [place], [place] [address] Toll Free Main # [phone] Fax # [phone] Cell # [phone] [email or web address]

## 2021-11-17 DIAGNOSIS — J449 Chronic obstructive pulmonary disease, unspecified: Secondary | ICD-10-CM | POA: Diagnosis not present

## 2021-12-11 DIAGNOSIS — J449 Chronic obstructive pulmonary disease, unspecified: Secondary | ICD-10-CM | POA: Diagnosis not present

## 2022-02-24 DIAGNOSIS — Z0000 Encounter for general adult medical examination without abnormal findings: Secondary | ICD-10-CM | POA: Diagnosis not present

## 2022-03-27 DIAGNOSIS — J9611 Chronic respiratory failure with hypoxia: Secondary | ICD-10-CM | POA: Diagnosis not present

## 2022-03-27 DIAGNOSIS — Z9981 Dependence on supplemental oxygen: Secondary | ICD-10-CM | POA: Diagnosis not present

## 2022-03-27 DIAGNOSIS — R6889 Other general symptoms and signs: Secondary | ICD-10-CM | POA: Diagnosis not present

## 2022-03-27 DIAGNOSIS — M069 Rheumatoid arthritis, unspecified: Secondary | ICD-10-CM | POA: Diagnosis not present

## 2022-03-27 DIAGNOSIS — E039 Hypothyroidism, unspecified: Secondary | ICD-10-CM | POA: Diagnosis not present

## 2022-03-27 DIAGNOSIS — K449 Diaphragmatic hernia without obstruction or gangrene: Secondary | ICD-10-CM | POA: Diagnosis not present

## 2022-03-27 DIAGNOSIS — Z7401 Bed confinement status: Secondary | ICD-10-CM | POA: Diagnosis not present

## 2022-03-27 DIAGNOSIS — R0902 Hypoxemia: Secondary | ICD-10-CM | POA: Diagnosis not present

## 2022-03-27 DIAGNOSIS — I110 Hypertensive heart disease with heart failure: Secondary | ICD-10-CM | POA: Diagnosis not present

## 2022-03-27 DIAGNOSIS — R404 Transient alteration of awareness: Secondary | ICD-10-CM | POA: Diagnosis not present

## 2022-03-27 DIAGNOSIS — E876 Hypokalemia: Secondary | ICD-10-CM | POA: Diagnosis not present

## 2022-03-27 DIAGNOSIS — E873 Alkalosis: Secondary | ICD-10-CM | POA: Diagnosis not present

## 2022-03-27 DIAGNOSIS — J440 Chronic obstructive pulmonary disease with acute lower respiratory infection: Secondary | ICD-10-CM | POA: Diagnosis not present

## 2022-03-27 DIAGNOSIS — K219 Gastro-esophageal reflux disease without esophagitis: Secondary | ICD-10-CM | POA: Diagnosis not present

## 2022-03-27 DIAGNOSIS — R059 Cough, unspecified: Secondary | ICD-10-CM | POA: Diagnosis not present

## 2022-03-27 DIAGNOSIS — R531 Weakness: Secondary | ICD-10-CM | POA: Diagnosis not present

## 2022-03-27 DIAGNOSIS — I5032 Chronic diastolic (congestive) heart failure: Secondary | ICD-10-CM | POA: Diagnosis not present

## 2022-03-27 DIAGNOSIS — E1165 Type 2 diabetes mellitus with hyperglycemia: Secondary | ICD-10-CM | POA: Diagnosis not present

## 2022-03-27 DIAGNOSIS — E861 Hypovolemia: Secondary | ICD-10-CM | POA: Diagnosis not present

## 2022-03-27 DIAGNOSIS — E878 Other disorders of electrolyte and fluid balance, not elsewhere classified: Secondary | ICD-10-CM | POA: Diagnosis not present

## 2022-03-27 DIAGNOSIS — R069 Unspecified abnormalities of breathing: Secondary | ICD-10-CM | POA: Diagnosis not present

## 2022-03-27 DIAGNOSIS — E871 Hypo-osmolality and hyponatremia: Secondary | ICD-10-CM | POA: Diagnosis not present

## 2022-03-27 DIAGNOSIS — Z743 Need for continuous supervision: Secondary | ICD-10-CM | POA: Diagnosis not present

## 2022-03-27 DIAGNOSIS — R0602 Shortness of breath: Secondary | ICD-10-CM | POA: Diagnosis not present

## 2022-03-27 DIAGNOSIS — D649 Anemia, unspecified: Secondary | ICD-10-CM | POA: Diagnosis not present

## 2022-03-27 DIAGNOSIS — E7800 Pure hypercholesterolemia, unspecified: Secondary | ICD-10-CM | POA: Diagnosis not present

## 2022-03-27 DIAGNOSIS — R062 Wheezing: Secondary | ICD-10-CM | POA: Diagnosis not present

## 2022-03-27 DIAGNOSIS — I361 Nonrheumatic tricuspid (valve) insufficiency: Secondary | ICD-10-CM | POA: Diagnosis not present

## 2022-03-27 DIAGNOSIS — Z87891 Personal history of nicotine dependence: Secondary | ICD-10-CM | POA: Diagnosis not present

## 2022-03-28 DIAGNOSIS — I361 Nonrheumatic tricuspid (valve) insufficiency: Secondary | ICD-10-CM

## 2022-05-06 DIAGNOSIS — Z7984 Long term (current) use of oral hypoglycemic drugs: Secondary | ICD-10-CM | POA: Diagnosis not present

## 2022-05-06 DIAGNOSIS — E7800 Pure hypercholesterolemia, unspecified: Secondary | ICD-10-CM | POA: Diagnosis not present

## 2022-05-06 DIAGNOSIS — Z1152 Encounter for screening for COVID-19: Secondary | ICD-10-CM | POA: Diagnosis not present

## 2022-05-06 DIAGNOSIS — Z7401 Bed confinement status: Secondary | ICD-10-CM | POA: Diagnosis not present

## 2022-05-06 DIAGNOSIS — E871 Hypo-osmolality and hyponatremia: Secondary | ICD-10-CM | POA: Diagnosis not present

## 2022-05-06 DIAGNOSIS — K219 Gastro-esophageal reflux disease without esophagitis: Secondary | ICD-10-CM | POA: Diagnosis not present

## 2022-05-06 DIAGNOSIS — I10 Essential (primary) hypertension: Secondary | ICD-10-CM | POA: Diagnosis not present

## 2022-05-06 DIAGNOSIS — E876 Hypokalemia: Secondary | ICD-10-CM | POA: Diagnosis not present

## 2022-05-06 DIAGNOSIS — R059 Cough, unspecified: Secondary | ICD-10-CM | POA: Diagnosis not present

## 2022-05-06 DIAGNOSIS — R29810 Facial weakness: Secondary | ICD-10-CM | POA: Diagnosis not present

## 2022-05-06 DIAGNOSIS — R5381 Other malaise: Secondary | ICD-10-CM | POA: Diagnosis not present

## 2022-05-06 DIAGNOSIS — I4891 Unspecified atrial fibrillation: Secondary | ICD-10-CM | POA: Diagnosis not present

## 2022-05-06 DIAGNOSIS — R0602 Shortness of breath: Secondary | ICD-10-CM | POA: Diagnosis not present

## 2022-05-06 DIAGNOSIS — Z888 Allergy status to other drugs, medicaments and biological substances status: Secondary | ICD-10-CM | POA: Diagnosis not present

## 2022-05-06 DIAGNOSIS — I499 Cardiac arrhythmia, unspecified: Secondary | ICD-10-CM | POA: Diagnosis not present

## 2022-05-06 DIAGNOSIS — J690 Pneumonitis due to inhalation of food and vomit: Secondary | ICD-10-CM | POA: Diagnosis not present

## 2022-05-06 DIAGNOSIS — R739 Hyperglycemia, unspecified: Secondary | ICD-10-CM | POA: Diagnosis not present

## 2022-05-06 DIAGNOSIS — D649 Anemia, unspecified: Secondary | ICD-10-CM | POA: Diagnosis not present

## 2022-05-06 DIAGNOSIS — D72828 Other elevated white blood cell count: Secondary | ICD-10-CM | POA: Diagnosis not present

## 2022-05-06 DIAGNOSIS — L659 Nonscarring hair loss, unspecified: Secondary | ICD-10-CM | POA: Diagnosis not present

## 2022-05-06 DIAGNOSIS — Z7901 Long term (current) use of anticoagulants: Secondary | ICD-10-CM | POA: Diagnosis not present

## 2022-05-06 DIAGNOSIS — I110 Hypertensive heart disease with heart failure: Secondary | ICD-10-CM | POA: Diagnosis not present

## 2022-05-06 DIAGNOSIS — K449 Diaphragmatic hernia without obstruction or gangrene: Secondary | ICD-10-CM | POA: Diagnosis not present

## 2022-05-06 DIAGNOSIS — Z743 Need for continuous supervision: Secondary | ICD-10-CM | POA: Diagnosis not present

## 2022-05-06 DIAGNOSIS — I440 Atrioventricular block, first degree: Secondary | ICD-10-CM | POA: Diagnosis not present

## 2022-05-06 DIAGNOSIS — J431 Panlobular emphysema: Secondary | ICD-10-CM | POA: Diagnosis not present

## 2022-05-06 DIAGNOSIS — E1142 Type 2 diabetes mellitus with diabetic polyneuropathy: Secondary | ICD-10-CM | POA: Diagnosis not present

## 2022-05-06 DIAGNOSIS — I509 Heart failure, unspecified: Secondary | ICD-10-CM | POA: Diagnosis not present

## 2022-05-06 DIAGNOSIS — R062 Wheezing: Secondary | ICD-10-CM | POA: Diagnosis not present

## 2022-05-06 DIAGNOSIS — M069 Rheumatoid arthritis, unspecified: Secondary | ICD-10-CM | POA: Diagnosis not present

## 2022-05-06 DIAGNOSIS — E039 Hypothyroidism, unspecified: Secondary | ICD-10-CM | POA: Diagnosis not present

## 2022-05-06 DIAGNOSIS — Z79899 Other long term (current) drug therapy: Secondary | ICD-10-CM | POA: Diagnosis not present

## 2022-05-08 DIAGNOSIS — I440 Atrioventricular block, first degree: Secondary | ICD-10-CM | POA: Diagnosis not present

## 2022-05-15 DIAGNOSIS — Z0000 Encounter for general adult medical examination without abnormal findings: Secondary | ICD-10-CM | POA: Diagnosis not present

## 2022-05-22 DIAGNOSIS — Z0000 Encounter for general adult medical examination without abnormal findings: Secondary | ICD-10-CM | POA: Diagnosis not present

## 2022-05-26 DIAGNOSIS — Z7409 Other reduced mobility: Secondary | ICD-10-CM | POA: Diagnosis not present

## 2022-05-26 DIAGNOSIS — J9611 Chronic respiratory failure with hypoxia: Secondary | ICD-10-CM | POA: Diagnosis not present

## 2022-05-26 DIAGNOSIS — E876 Hypokalemia: Secondary | ICD-10-CM | POA: Diagnosis not present

## 2022-05-26 DIAGNOSIS — I5030 Unspecified diastolic (congestive) heart failure: Secondary | ICD-10-CM | POA: Diagnosis not present

## 2022-05-26 DIAGNOSIS — J449 Chronic obstructive pulmonary disease, unspecified: Secondary | ICD-10-CM | POA: Diagnosis not present

## 2022-05-26 DIAGNOSIS — Z789 Other specified health status: Secondary | ICD-10-CM | POA: Diagnosis not present

## 2022-05-27 DIAGNOSIS — Z0000 Encounter for general adult medical examination without abnormal findings: Secondary | ICD-10-CM | POA: Diagnosis not present

## 2022-05-27 LAB — LAB REPORT - SCANNED: EGFR: 58

## 2022-05-29 DIAGNOSIS — Z0000 Encounter for general adult medical examination without abnormal findings: Secondary | ICD-10-CM | POA: Diagnosis not present

## 2022-06-15 DIAGNOSIS — Z0000 Encounter for general adult medical examination without abnormal findings: Secondary | ICD-10-CM | POA: Diagnosis not present

## 2022-06-22 DIAGNOSIS — Z0000 Encounter for general adult medical examination without abnormal findings: Secondary | ICD-10-CM | POA: Diagnosis not present

## 2022-06-26 DIAGNOSIS — I5030 Unspecified diastolic (congestive) heart failure: Secondary | ICD-10-CM | POA: Diagnosis not present

## 2022-06-26 DIAGNOSIS — J9611 Chronic respiratory failure with hypoxia: Secondary | ICD-10-CM | POA: Diagnosis not present

## 2022-06-26 DIAGNOSIS — J449 Chronic obstructive pulmonary disease, unspecified: Secondary | ICD-10-CM | POA: Diagnosis not present

## 2022-06-27 DIAGNOSIS — Z0000 Encounter for general adult medical examination without abnormal findings: Secondary | ICD-10-CM | POA: Diagnosis not present

## 2022-06-29 DIAGNOSIS — Z0000 Encounter for general adult medical examination without abnormal findings: Secondary | ICD-10-CM | POA: Diagnosis not present

## 2022-07-21 DIAGNOSIS — Z0000 Encounter for general adult medical examination without abnormal findings: Secondary | ICD-10-CM | POA: Diagnosis not present

## 2022-07-28 DIAGNOSIS — Z0000 Encounter for general adult medical examination without abnormal findings: Secondary | ICD-10-CM | POA: Diagnosis not present

## 2022-08-02 DIAGNOSIS — J449 Chronic obstructive pulmonary disease, unspecified: Secondary | ICD-10-CM | POA: Diagnosis not present

## 2022-08-02 DIAGNOSIS — Z0000 Encounter for general adult medical examination without abnormal findings: Secondary | ICD-10-CM | POA: Diagnosis not present

## 2022-08-02 DIAGNOSIS — J9611 Chronic respiratory failure with hypoxia: Secondary | ICD-10-CM | POA: Diagnosis not present

## 2022-08-02 DIAGNOSIS — I5030 Unspecified diastolic (congestive) heart failure: Secondary | ICD-10-CM | POA: Diagnosis not present

## 2022-08-17 DIAGNOSIS — E1169 Type 2 diabetes mellitus with other specified complication: Secondary | ICD-10-CM | POA: Diagnosis not present

## 2022-08-17 DIAGNOSIS — I4891 Unspecified atrial fibrillation: Secondary | ICD-10-CM | POA: Diagnosis not present

## 2022-08-17 DIAGNOSIS — E039 Hypothyroidism, unspecified: Secondary | ICD-10-CM | POA: Diagnosis not present

## 2022-08-17 DIAGNOSIS — E785 Hyperlipidemia, unspecified: Secondary | ICD-10-CM | POA: Diagnosis not present

## 2022-08-21 DIAGNOSIS — Z0000 Encounter for general adult medical examination without abnormal findings: Secondary | ICD-10-CM | POA: Diagnosis not present

## 2022-08-28 DIAGNOSIS — Z0000 Encounter for general adult medical examination without abnormal findings: Secondary | ICD-10-CM | POA: Diagnosis not present

## 2022-08-31 DIAGNOSIS — E1169 Type 2 diabetes mellitus with other specified complication: Secondary | ICD-10-CM | POA: Diagnosis not present

## 2022-09-08 DIAGNOSIS — E785 Hyperlipidemia, unspecified: Secondary | ICD-10-CM | POA: Diagnosis not present

## 2022-09-08 DIAGNOSIS — E1159 Type 2 diabetes mellitus with other circulatory complications: Secondary | ICD-10-CM | POA: Diagnosis not present

## 2022-09-08 DIAGNOSIS — I152 Hypertension secondary to endocrine disorders: Secondary | ICD-10-CM | POA: Diagnosis not present

## 2022-09-08 DIAGNOSIS — E1169 Type 2 diabetes mellitus with other specified complication: Secondary | ICD-10-CM | POA: Diagnosis not present

## 2022-09-10 DIAGNOSIS — E119 Type 2 diabetes mellitus without complications: Secondary | ICD-10-CM | POA: Insufficient documentation

## 2022-09-10 DIAGNOSIS — E039 Hypothyroidism, unspecified: Secondary | ICD-10-CM | POA: Insufficient documentation

## 2022-09-10 DIAGNOSIS — F209 Schizophrenia, unspecified: Secondary | ICD-10-CM | POA: Insufficient documentation

## 2022-09-10 DIAGNOSIS — M1990 Unspecified osteoarthritis, unspecified site: Secondary | ICD-10-CM | POA: Insufficient documentation

## 2022-09-10 DIAGNOSIS — I10 Essential (primary) hypertension: Secondary | ICD-10-CM | POA: Insufficient documentation

## 2022-09-10 DIAGNOSIS — F419 Anxiety disorder, unspecified: Secondary | ICD-10-CM | POA: Insufficient documentation

## 2022-09-10 DIAGNOSIS — E7800 Pure hypercholesterolemia, unspecified: Secondary | ICD-10-CM | POA: Insufficient documentation

## 2022-09-13 DIAGNOSIS — Z0000 Encounter for general adult medical examination without abnormal findings: Secondary | ICD-10-CM | POA: Diagnosis not present

## 2022-09-20 DIAGNOSIS — Z0000 Encounter for general adult medical examination without abnormal findings: Secondary | ICD-10-CM | POA: Diagnosis not present

## 2022-09-21 NOTE — Progress Notes (Deleted)
Cardiology Office Note:    Date:  09/22/2022   ID:  [name], DOB 1945-06-10, MRN [id number]  PCP:  Charlott Rakes, MD  Cardiologist:  Norman Herrlich, MD   Referring MD: Lezlie Lye, Meda Coffee, *  ASSESSMENT:    1. Paroxysmal atrial fibrillation (HCC)   2. Chronic anticoagulation   3. Primary hypertension   4. Chronic obstructive pulmonary disease, unspecified COPD type (HCC)   5. Acquired hypothyroidism    PLAN:    In order of problems listed above:  ***  Next appointment   Medication Adjustments/Labs and Tests Ordered: Current medicines are reviewed at length with the patient today.  Concerns regarding medicines are outlined above.  No orders of the defined types were placed in this encounter.  No orders of the defined types were placed in this encounter.    No chief complaint on file. ***  History of Present Illness:    Karla Clark is a 77 y.o. female who is being seen today for the evaluation of atrial fibrillation at the request of Lezlie Lye, Irma M, *.  She has a history of atrial fibrillation with anticoagulation hypothyroidism type 2 diabetes with hyperlipidemia. An echocardiogram [facility] 03/28/2022 at that time she was in sinus rhythm left ventricle normal size wall thickness ejection fraction 60 to 65% left atrium is normal in size and there is no significant valvular abnormality. She presented to the ED 05/06/2022 with shortness of breath was found to have rapid atrial fibrillation she had a marked leukocytosis and an exacerbation of  COPD.  proBNP level was not elevated at 541 and was undetectable.  I personally reviewed the EKG 05/08/2022 showing sinus tachycardia. Past Medical History:  Diagnosis Date   Anxiety    COPD (chronic obstructive pulmonary disease) (HCC)    Diabetes mellitus without complication (HCC)    Elevated cholesterol    Hypertension    Hypothyroidism    Osteoarthritis    RA (rheumatoid arthritis) (HCC)     Schizophrenia (HCC)    delusional    Past Surgical History:  Procedure Laterality Date   ABDOMINAL HYSTERECTOMY      Current Medications: No outpatient medications have been marked as taking for the 09/22/22 encounter (Appointment) with Baldo Daub, MD.     Allergies:   Ace inhibitors   Social History   Socioeconomic History   Marital status: Widowed    Spouse name: Not on file   Number of children: 3   Years of education: 72   Highest education level: 12th grade  Occupational History   Not on file  Tobacco Use   Smoking status: Former    Types: Cigarettes    Quit date: 03/11/1986    Years since quitting: 36.5    Passive exposure: Past   Smokeless tobacco: Never   Tobacco comments:    Verified by Daughter - Maureen Chatters  Vaping Use   Vaping Use: Never used  Substance and Sexual Activity   Alcohol use: No   Drug use: No   Sexual activity: Not Currently  Other Topics Concern   Not on file  Social History Narrative   Not on file   Social Determinants of Health   Financial Resource Strain: Low Risk  (05/25/2021)   Overall Financial Resource Strain (CARDIA)    Difficulty of Paying Living Expenses: Not hard at all  Food Insecurity: No Food Insecurity (05/25/2021)   Hunger Vital Sign    Worried About Running Out of Food in  the Last Year: Never true    Ran Out of Food in the Last Year: Never true  Transportation Needs: No Transportation Needs (05/25/2021)   PRAPARE - Administrator, Civil Service (Medical): No    Lack of Transportation (Non-Medical): No  Physical Activity: Inactive (05/25/2021)   Exercise Vital Sign    Days of Exercise per Week: 0 days    Minutes of Exercise per Session: 0 min  Stress: No Stress Concern Present (05/25/2021)   [employer] of Occupational Health - Occupational Stress Questionnaire    Feeling of Stress : Not at all  Social Connections: Socially Isolated (05/25/2021)   Social Connection and Isolation Panel  [NHANES]    Frequency of Communication with Friends and Family: More than three times a week    Frequency of Social Gatherings with Friends and Family: More than three times a week    Attends Religious Services: Never    Database administrator or Organizations: No    Attends Banker Meetings: Never    Marital Status: Widowed     Family History: The patient's ***family history includes Alcoholism in her father; Colon cancer in her father; Depression in her mother.  ROS:   ROS Please see the history of present illness.    *** All other systems reviewed and are negative.  EKGs/Labs/Other Studies Reviewed:    The following studies were reviewed today: ***  Cardiac Studies & Procedures       ECHOCARDIOGRAM  ECHOCARDIOGRAM COMPLETE 03/10/2019  Narrative ECHOCARDIOGRAM REPORT    Patient Name:   Karla Clark [facility] Date of Exam: 03/10/2019 Medical Rec #:  [id number]         Height:       62.0 in Accession #:    [id number]        Weight:       170.0 lb Date of Birth:  [birth year]        BSA:          1.78 m Patient Age:    77 years          BP:           141/65 mmHg Patient Gender: F                 HR:           74 bpm. Exam Location:  Inpatient  Procedure: 2D Echo  Indications:    dyspnea 786.09  History:        Patient has no prior history of Echocardiogram examinations. COPD Risk Factors:Hypertension, Dyslipidemia, Diabetes and Former Smoker.  Sonographer:    Celene Skeen RDCS (AE) Referring Phys: [phone] AMRIT ADHIKARI  IMPRESSIONS   1. Left ventricular ejection fraction, by visual estimation, is 70 to 75%. The left ventricle has hyperdynamic function. There is no left ventricular hypertrophy. 2. Left ventricular diastolic parameters are consistent with Grade I diastolic dysfunction (impaired relaxation). 3. Global right ventricle has normal systolic function.The right ventricular size is normal. No increase in right ventricular wall thickness. 4. Left  atrial size was normal. 5. Right atrial size was normal. 6. Presence of pericardial fat pad. 7. Mild aortic valve annular calcification. 8. The mitral valve is mildly thickened. Trace mitral valve regurgitation. 9. The tricuspid valve is grossly normal. Tricuspid valve regurgitation is not demonstrated. 10. The aortic valve was not well visualized. Aortic valve regurgitation is not visualized. 11. The pulmonic valve was not well visualized. Pulmonic valve regurgitation is trivial.  12. TR signal is inadequate for assessing pulmonary artery systolic pressure. 13. The inferior vena cava is dilated in size with >50% respiratory variability, suggesting right atrial pressure of 8 mmHg.  In comparison to the previous echocardiogram(s): There are no prior studies on this patient for comparison purposes. FINDINGS Left Ventricle: Left ventricular ejection fraction, by visual estimation, is 70 to 75%. The left ventricle has hyperdynamic function. The left ventricular internal cavity size was the left ventricle is normal in size. There is no left ventricular hypertrophy. Left ventricular diastolic parameters are consistent with Grade I diastolic dysfunction (impaired relaxation).   LV Wall Scoring: All segments are normal.  Right Ventricle: The right ventricular size is normal. No increase in right ventricular wall thickness. Global RV systolic function is has normal systolic function.  Left Atrium: Left atrial size was normal in size.  Right Atrium: Right atrial size was normal in size  Pericardium: There is no evidence of pericardial effusion. Presence of pericardial fat pad.  Mitral Valve: The mitral valve is grossly normal. There is mild thickening of the mitral valve leaflet(s). Trace mitral valve regurgitation.  Tricuspid Valve: The tricuspid valve is grossly normal. Tricuspid valve regurgitation is not demonstrated.  Aortic Valve: The aortic valve was not well visualized. Aortic valve  regurgitation is not visualized. Mild aortic valve annular calcification.  Pulmonic Valve: The pulmonic valve was not well visualized. Pulmonic valve regurgitation is trivial.  Aorta: The aortic root is normal in size and structure.  Venous: The inferior vena cava is dilated in size with greater than 50% respiratory variability, suggesting right atrial pressure of 8 mmHg.  IAS/Shunts: No atrial level shunt detected by color flow Doppler.    LEFT VENTRICLE PLAX 2D LVIDd:         4.00 cm  Diastology LVIDs:         2.50 cm  LV e' lateral:   8.70 cm/s LV PW:         0.90 cm  LV E/e' lateral: 11.3 LV IVS:        0.90 cm  LV e' medial:    6.53 cm/s LVOT diam:     2.00 cm  LV E/e' medial:  15.0 LV SV:         48 ml LV SV Index:   25.55 LVOT Area:     3.14 cm   RIGHT VENTRICLE RV S prime:     14.60 cm/s TAPSE (M-mode): 1.8 cm  LEFT ATRIUM             Index       RIGHT ATRIUM           Index LA diam:        3.40 cm 1.91 cm/m  RA Area:     10.20 cm LA Vol (A2C):   13.1 ml 7.34 ml/m  RA Volume:   20.20 ml  11.32 ml/m LA Vol (A4C):   37.5 ml 21.02 ml/m LA Biplane Vol: 23.1 ml 12.95 ml/m AORTIC VALVE LVOT Vmax:   115.00 cm/s LVOT Vmean:  73.800 cm/s LVOT VTI:    0.207 m  AORTA Ao Root diam: 2.70 cm  MITRAL VALVE MV Area (PHT): 2.76 cm              SHUNTS MV PHT:        79.75 msec            Systemic VTI:  0.21 m MV Decel Time: 275 msec  Systemic Diam: 2.00 cm MV E velocity: 97.90 cm/s  103 cm/s MV A velocity: 106.00 cm/s 70.3 cm/s MV E/A ratio:  0.92        1.5   Nona Dell MD Electronically signed by Nona Dell MD Signature Date/Time: 03/10/2019/12:13:59 PM    Final             EKG:  EKG is *** ordered today.  The ekg ordered today is personally reviewed and demonstrates ***  Recent Labs: No results found for requested labs within last 365 days.  Recent Lipid Panel No results found for: "CHOL", "TRIG", "HDL", "CHOLHDL", "VLDL",  "LDLCALC", "LDLDIRECT"  Physical Exam:    VS:  There were no vitals taken for this visit.    Wt Readings from Last 3 Encounters:  05/12/19 157 lb 3 oz (71.3 kg)  03/07/19 170 lb (77.1 kg)  08/14/17 170 lb (77.1 kg)     GEN: *** Well nourished, well developed in no acute distress HEENT: Normal NECK: No JVD; No carotid bruits LYMPHATICS: No lymphadenopathy CARDIAC: ***RRR, no murmurs, rubs, gallops RESPIRATORY:  Clear to auscultation without rales, wheezing or rhonchi  ABDOMEN: Soft, non-tender, non-distended MUSCULOSKELETAL:  No edema; No deformity  SKIN: Warm and dry NEUROLOGIC:  Alert and oriented x 3 PSYCHIATRIC:  Normal affect     Signed, Norman Herrlich, MD  09/22/2022 7:27 AM    [facility]

## 2022-09-22 ENCOUNTER — Telehealth: Payer: Self-pay | Admitting: Cardiology

## 2022-09-22 ENCOUNTER — Ambulatory Visit: Payer: Medicare Other | Attending: Cardiology | Admitting: Cardiology

## 2022-09-22 NOTE — Telephone Encounter (Signed)
Left message for Karla Clark to call back.

## 2022-09-22 NOTE — Telephone Encounter (Signed)
Patient had an appt today at 1:40 and wanted to talk with Dr. Dulce Sellar or nurse about why they did not show up

## 2022-09-24 NOTE — Telephone Encounter (Signed)
Called the daughter, Sheliah Mends and she asked me to call the other daughter Rinaldo Cloud for an explanation why the patient did not come to her appointment with Dr. Dulce Sellar. Called Rinaldo Cloud and she stated that the patient has dementia and is schizophrenic and would not get out of the car to come in to the building for the appointment. Rinaldo Cloud stated that she ended up taking the patient back home rather than trying to make the patient come in the building for her appointment. I explained that if she wanted to reschedule her appointment she could call the office and set - up another appointment. Rinaldo Cloud was appreciative and had no further questions at this time.

## 2022-09-25 DIAGNOSIS — Z0000 Encounter for general adult medical examination without abnormal findings: Secondary | ICD-10-CM | POA: Diagnosis not present

## 2022-09-27 DIAGNOSIS — Z0000 Encounter for general adult medical examination without abnormal findings: Secondary | ICD-10-CM | POA: Diagnosis not present

## 2022-10-04 ENCOUNTER — Encounter: Payer: Self-pay | Admitting: Family Medicine

## 2022-10-08 DIAGNOSIS — E039 Hypothyroidism, unspecified: Secondary | ICD-10-CM | POA: Diagnosis not present

## 2022-10-21 DIAGNOSIS — Z0000 Encounter for general adult medical examination without abnormal findings: Secondary | ICD-10-CM | POA: Diagnosis not present

## 2022-10-26 DIAGNOSIS — Z0000 Encounter for general adult medical examination without abnormal findings: Secondary | ICD-10-CM | POA: Diagnosis not present

## 2022-10-28 DIAGNOSIS — Z0000 Encounter for general adult medical examination without abnormal findings: Secondary | ICD-10-CM | POA: Diagnosis not present

## 2022-11-13 DIAGNOSIS — Z0000 Encounter for general adult medical examination without abnormal findings: Secondary | ICD-10-CM | POA: Diagnosis not present

## 2022-11-20 DIAGNOSIS — Z0000 Encounter for general adult medical examination without abnormal findings: Secondary | ICD-10-CM | POA: Diagnosis not present

## 2022-11-25 DIAGNOSIS — Z0000 Encounter for general adult medical examination without abnormal findings: Secondary | ICD-10-CM | POA: Diagnosis not present

## 2022-11-27 DIAGNOSIS — Z0000 Encounter for general adult medical examination without abnormal findings: Secondary | ICD-10-CM | POA: Diagnosis not present

## 2022-12-14 DIAGNOSIS — Z0000 Encounter for general adult medical examination without abnormal findings: Secondary | ICD-10-CM | POA: Diagnosis not present

## 2022-12-21 DIAGNOSIS — Z0000 Encounter for general adult medical examination without abnormal findings: Secondary | ICD-10-CM | POA: Diagnosis not present

## 2022-12-26 DIAGNOSIS — Z0000 Encounter for general adult medical examination without abnormal findings: Secondary | ICD-10-CM | POA: Diagnosis not present

## 2022-12-28 DIAGNOSIS — Z0000 Encounter for general adult medical examination without abnormal findings: Secondary | ICD-10-CM | POA: Diagnosis not present

## 2023-01-19 DIAGNOSIS — E1169 Type 2 diabetes mellitus with other specified complication: Secondary | ICD-10-CM | POA: Diagnosis not present

## 2023-02-04 DIAGNOSIS — E785 Hyperlipidemia, unspecified: Secondary | ICD-10-CM | POA: Diagnosis not present

## 2023-02-04 DIAGNOSIS — E1169 Type 2 diabetes mellitus with other specified complication: Secondary | ICD-10-CM | POA: Diagnosis not present

## 2023-02-04 DIAGNOSIS — E039 Hypothyroidism, unspecified: Secondary | ICD-10-CM | POA: Diagnosis not present

## 2023-02-04 DIAGNOSIS — E1159 Type 2 diabetes mellitus with other circulatory complications: Secondary | ICD-10-CM | POA: Diagnosis not present

## 2023-04-05 DIAGNOSIS — G4489 Other headache syndrome: Secondary | ICD-10-CM | POA: Diagnosis not present

## 2023-04-05 DIAGNOSIS — R0902 Hypoxemia: Secondary | ICD-10-CM | POA: Diagnosis not present

## 2023-04-05 DIAGNOSIS — J440 Chronic obstructive pulmonary disease with acute lower respiratory infection: Secondary | ICD-10-CM | POA: Diagnosis not present

## 2023-04-05 DIAGNOSIS — I510 Cardiac septal defect, acquired: Secondary | ICD-10-CM | POA: Diagnosis not present

## 2023-04-05 DIAGNOSIS — R000 Tachycardia, unspecified: Secondary | ICD-10-CM | POA: Diagnosis not present

## 2023-04-05 DIAGNOSIS — R0602 Shortness of breath: Secondary | ICD-10-CM | POA: Diagnosis not present

## 2023-04-05 DIAGNOSIS — Z743 Need for continuous supervision: Secondary | ICD-10-CM | POA: Diagnosis not present

## 2023-04-05 DIAGNOSIS — R9431 Abnormal electrocardiogram [ECG] [EKG]: Secondary | ICD-10-CM | POA: Diagnosis not present

## 2023-04-05 DIAGNOSIS — R062 Wheezing: Secondary | ICD-10-CM | POA: Diagnosis not present

## 2023-04-06 DIAGNOSIS — I509 Heart failure, unspecified: Secondary | ICD-10-CM | POA: Diagnosis not present

## 2023-04-06 DIAGNOSIS — E7800 Pure hypercholesterolemia, unspecified: Secondary | ICD-10-CM | POA: Diagnosis not present

## 2023-04-06 DIAGNOSIS — J440 Chronic obstructive pulmonary disease with acute lower respiratory infection: Secondary | ICD-10-CM | POA: Diagnosis not present

## 2023-04-06 DIAGNOSIS — T380X5A Adverse effect of glucocorticoids and synthetic analogues, initial encounter: Secondary | ICD-10-CM | POA: Diagnosis not present

## 2023-04-06 DIAGNOSIS — E039 Hypothyroidism, unspecified: Secondary | ICD-10-CM | POA: Diagnosis not present

## 2023-04-06 DIAGNOSIS — E538 Deficiency of other specified B group vitamins: Secondary | ICD-10-CM | POA: Diagnosis not present

## 2023-04-06 DIAGNOSIS — R9431 Abnormal electrocardiogram [ECG] [EKG]: Secondary | ICD-10-CM | POA: Diagnosis not present

## 2023-04-06 DIAGNOSIS — Z681 Body mass index (BMI) 19 or less, adult: Secondary | ICD-10-CM | POA: Diagnosis not present

## 2023-04-06 DIAGNOSIS — R0902 Hypoxemia: Secondary | ICD-10-CM | POA: Diagnosis not present

## 2023-04-06 DIAGNOSIS — Z7401 Bed confinement status: Secondary | ICD-10-CM | POA: Diagnosis not present

## 2023-04-06 DIAGNOSIS — Z7984 Long term (current) use of oral hypoglycemic drugs: Secondary | ICD-10-CM | POA: Diagnosis not present

## 2023-04-06 DIAGNOSIS — K121 Other forms of stomatitis: Secondary | ICD-10-CM | POA: Diagnosis not present

## 2023-04-06 DIAGNOSIS — M069 Rheumatoid arthritis, unspecified: Secondary | ICD-10-CM | POA: Diagnosis not present

## 2023-04-06 DIAGNOSIS — E440 Moderate protein-calorie malnutrition: Secondary | ICD-10-CM | POA: Diagnosis not present

## 2023-04-06 DIAGNOSIS — Z888 Allergy status to other drugs, medicaments and biological substances status: Secondary | ICD-10-CM | POA: Diagnosis not present

## 2023-04-06 DIAGNOSIS — R000 Tachycardia, unspecified: Secondary | ICD-10-CM | POA: Diagnosis not present

## 2023-04-06 DIAGNOSIS — R410 Disorientation, unspecified: Secondary | ICD-10-CM | POA: Diagnosis not present

## 2023-04-06 DIAGNOSIS — E1165 Type 2 diabetes mellitus with hyperglycemia: Secondary | ICD-10-CM | POA: Diagnosis not present

## 2023-04-06 DIAGNOSIS — R0602 Shortness of breath: Secondary | ICD-10-CM | POA: Diagnosis not present

## 2023-04-06 DIAGNOSIS — M1990 Unspecified osteoarthritis, unspecified site: Secondary | ICD-10-CM | POA: Diagnosis not present

## 2023-04-06 DIAGNOSIS — D509 Iron deficiency anemia, unspecified: Secondary | ICD-10-CM | POA: Diagnosis not present

## 2023-04-06 DIAGNOSIS — I510 Cardiac septal defect, acquired: Secondary | ICD-10-CM | POA: Diagnosis not present

## 2023-04-06 DIAGNOSIS — J9601 Acute respiratory failure with hypoxia: Secondary | ICD-10-CM | POA: Diagnosis not present

## 2023-04-06 DIAGNOSIS — Z7951 Long term (current) use of inhaled steroids: Secondary | ICD-10-CM | POA: Diagnosis not present

## 2023-04-06 DIAGNOSIS — Z23 Encounter for immunization: Secondary | ICD-10-CM | POA: Diagnosis not present

## 2023-04-06 DIAGNOSIS — K219 Gastro-esophageal reflux disease without esophagitis: Secondary | ICD-10-CM | POA: Diagnosis not present

## 2023-04-06 DIAGNOSIS — I110 Hypertensive heart disease with heart failure: Secondary | ICD-10-CM | POA: Diagnosis not present

## 2023-04-06 DIAGNOSIS — G301 Alzheimer's disease with late onset: Secondary | ICD-10-CM | POA: Diagnosis not present

## 2023-05-12 DIAGNOSIS — R062 Wheezing: Secondary | ICD-10-CM | POA: Diagnosis not present

## 2023-05-12 DIAGNOSIS — S43002A Unspecified subluxation of left shoulder joint, initial encounter: Secondary | ICD-10-CM | POA: Diagnosis not present

## 2023-05-12 DIAGNOSIS — Z23 Encounter for immunization: Secondary | ICD-10-CM | POA: Diagnosis not present

## 2023-05-12 DIAGNOSIS — J690 Pneumonitis due to inhalation of food and vomit: Secondary | ICD-10-CM | POA: Diagnosis not present

## 2023-05-12 DIAGNOSIS — Z79899 Other long term (current) drug therapy: Secondary | ICD-10-CM | POA: Diagnosis not present

## 2023-05-12 DIAGNOSIS — R0602 Shortness of breath: Secondary | ICD-10-CM | POA: Diagnosis not present

## 2023-05-12 DIAGNOSIS — R9431 Abnormal electrocardiogram [ECG] [EKG]: Secondary | ICD-10-CM | POA: Diagnosis not present

## 2023-05-12 DIAGNOSIS — I499 Cardiac arrhythmia, unspecified: Secondary | ICD-10-CM | POA: Diagnosis not present

## 2023-05-12 DIAGNOSIS — B370 Candidal stomatitis: Secondary | ICD-10-CM | POA: Diagnosis not present

## 2023-05-12 DIAGNOSIS — J449 Chronic obstructive pulmonary disease, unspecified: Secondary | ICD-10-CM | POA: Diagnosis not present

## 2023-05-12 DIAGNOSIS — Z9981 Dependence on supplemental oxygen: Secondary | ICD-10-CM | POA: Diagnosis not present

## 2023-05-12 DIAGNOSIS — R0902 Hypoxemia: Secondary | ICD-10-CM | POA: Diagnosis not present

## 2023-05-12 DIAGNOSIS — R531 Weakness: Secondary | ICD-10-CM | POA: Diagnosis not present

## 2023-05-12 DIAGNOSIS — I959 Hypotension, unspecified: Secondary | ICD-10-CM | POA: Diagnosis not present

## 2023-05-12 DIAGNOSIS — X500XXA Overexertion from strenuous movement or load, initial encounter: Secondary | ICD-10-CM | POA: Diagnosis not present

## 2023-05-12 DIAGNOSIS — S43005A Unspecified dislocation of left shoulder joint, initial encounter: Secondary | ICD-10-CM | POA: Diagnosis not present

## 2023-05-12 DIAGNOSIS — T17908A Unspecified foreign body in respiratory tract, part unspecified causing other injury, initial encounter: Secondary | ICD-10-CM | POA: Diagnosis not present

## 2023-05-12 DIAGNOSIS — I510 Cardiac septal defect, acquired: Secondary | ICD-10-CM | POA: Diagnosis not present

## 2023-05-13 DIAGNOSIS — T17908A Unspecified foreign body in respiratory tract, part unspecified causing other injury, initial encounter: Secondary | ICD-10-CM | POA: Diagnosis not present

## 2023-05-13 DIAGNOSIS — R0902 Hypoxemia: Secondary | ICD-10-CM | POA: Diagnosis not present

## 2023-05-14 DIAGNOSIS — R0902 Hypoxemia: Secondary | ICD-10-CM | POA: Diagnosis not present

## 2023-05-14 DIAGNOSIS — T17908A Unspecified foreign body in respiratory tract, part unspecified causing other injury, initial encounter: Secondary | ICD-10-CM | POA: Diagnosis not present

## 2023-05-25 DIAGNOSIS — J690 Pneumonitis due to inhalation of food and vomit: Secondary | ICD-10-CM | POA: Diagnosis not present

## 2023-05-25 DIAGNOSIS — J9611 Chronic respiratory failure with hypoxia: Secondary | ICD-10-CM | POA: Diagnosis not present

## 2023-05-25 DIAGNOSIS — E639 Nutritional deficiency, unspecified: Secondary | ICD-10-CM | POA: Diagnosis not present

## 2023-05-25 DIAGNOSIS — Z7689 Persons encountering health services in other specified circumstances: Secondary | ICD-10-CM | POA: Diagnosis not present

## 2023-07-20 DIAGNOSIS — L02821 Furuncle of head [any part, except face]: Secondary | ICD-10-CM | POA: Diagnosis not present

## 2023-09-23 DIAGNOSIS — R079 Chest pain, unspecified: Secondary | ICD-10-CM | POA: Diagnosis not present

## 2023-09-23 DIAGNOSIS — I509 Heart failure, unspecified: Secondary | ICD-10-CM | POA: Diagnosis not present

## 2023-09-23 DIAGNOSIS — D649 Anemia, unspecified: Secondary | ICD-10-CM | POA: Diagnosis not present

## 2023-09-23 DIAGNOSIS — R918 Other nonspecific abnormal finding of lung field: Secondary | ICD-10-CM | POA: Diagnosis not present

## 2023-09-23 DIAGNOSIS — Z743 Need for continuous supervision: Secondary | ICD-10-CM | POA: Diagnosis not present

## 2023-09-23 DIAGNOSIS — R9431 Abnormal electrocardiogram [ECG] [EKG]: Secondary | ICD-10-CM | POA: Diagnosis not present

## 2023-09-23 DIAGNOSIS — J9601 Acute respiratory failure with hypoxia: Secondary | ICD-10-CM | POA: Diagnosis not present

## 2023-09-23 DIAGNOSIS — J90 Pleural effusion, not elsewhere classified: Secondary | ICD-10-CM | POA: Diagnosis not present

## 2023-09-23 DIAGNOSIS — R51 Headache with orthostatic component, not elsewhere classified: Secondary | ICD-10-CM | POA: Diagnosis not present

## 2023-09-23 DIAGNOSIS — A419 Sepsis, unspecified organism: Secondary | ICD-10-CM | POA: Diagnosis not present

## 2023-09-23 DIAGNOSIS — E1165 Type 2 diabetes mellitus with hyperglycemia: Secondary | ICD-10-CM | POA: Diagnosis not present

## 2023-09-23 DIAGNOSIS — Z9981 Dependence on supplemental oxygen: Secondary | ICD-10-CM | POA: Diagnosis not present

## 2023-09-23 DIAGNOSIS — Z888 Allergy status to other drugs, medicaments and biological substances status: Secondary | ICD-10-CM | POA: Diagnosis not present

## 2023-09-23 DIAGNOSIS — R6520 Severe sepsis without septic shock: Secondary | ICD-10-CM | POA: Diagnosis not present

## 2023-09-23 DIAGNOSIS — J9621 Acute and chronic respiratory failure with hypoxia: Secondary | ICD-10-CM | POA: Diagnosis not present

## 2023-09-23 DIAGNOSIS — E872 Acidosis, unspecified: Secondary | ICD-10-CM | POA: Diagnosis not present

## 2023-09-23 DIAGNOSIS — R0902 Hypoxemia: Secondary | ICD-10-CM | POA: Diagnosis not present

## 2023-09-23 DIAGNOSIS — G9341 Metabolic encephalopathy: Secondary | ICD-10-CM | POA: Diagnosis not present

## 2023-09-23 DIAGNOSIS — E7800 Pure hypercholesterolemia, unspecified: Secondary | ICD-10-CM | POA: Diagnosis not present

## 2023-09-23 DIAGNOSIS — Z79899 Other long term (current) drug therapy: Secondary | ICD-10-CM | POA: Diagnosis not present

## 2023-09-23 DIAGNOSIS — Z7401 Bed confinement status: Secondary | ICD-10-CM | POA: Diagnosis not present

## 2023-09-23 DIAGNOSIS — M069 Rheumatoid arthritis, unspecified: Secondary | ICD-10-CM | POA: Diagnosis not present

## 2023-09-23 DIAGNOSIS — K219 Gastro-esophageal reflux disease without esophagitis: Secondary | ICD-10-CM | POA: Diagnosis not present

## 2023-09-23 DIAGNOSIS — R000 Tachycardia, unspecified: Secondary | ICD-10-CM | POA: Diagnosis not present

## 2023-09-23 DIAGNOSIS — Z7951 Long term (current) use of inhaled steroids: Secondary | ICD-10-CM | POA: Diagnosis not present

## 2023-09-23 DIAGNOSIS — J690 Pneumonitis due to inhalation of food and vomit: Secondary | ICD-10-CM | POA: Diagnosis not present

## 2023-09-23 DIAGNOSIS — I110 Hypertensive heart disease with heart failure: Secondary | ICD-10-CM | POA: Diagnosis not present

## 2023-09-23 DIAGNOSIS — E039 Hypothyroidism, unspecified: Secondary | ICD-10-CM | POA: Diagnosis not present

## 2023-09-23 DIAGNOSIS — J441 Chronic obstructive pulmonary disease with (acute) exacerbation: Secondary | ICD-10-CM | POA: Diagnosis not present

## 2023-09-27 DIAGNOSIS — Z7401 Bed confinement status: Secondary | ICD-10-CM | POA: Diagnosis not present

## 2023-09-27 DIAGNOSIS — J90 Pleural effusion, not elsewhere classified: Secondary | ICD-10-CM | POA: Diagnosis not present

## 2023-09-27 DIAGNOSIS — Z743 Need for continuous supervision: Secondary | ICD-10-CM | POA: Diagnosis not present
